# Patient Record
Sex: Male | Born: 1945 | Race: White | Hispanic: No | Marital: Married | Smoking: Former smoker
Health system: Southern US, Community
[De-identification: ages and names within clinical notes are randomized; demographics above are authoritative.]

## PROBLEM LIST (undated history)

## (undated) DIAGNOSIS — E119 Type 2 diabetes mellitus without complications: Secondary | ICD-10-CM

## (undated) DIAGNOSIS — E7800 Pure hypercholesterolemia, unspecified: Secondary | ICD-10-CM

## (undated) HISTORY — PX: BACK SURGERY: SHX140

## (undated) HISTORY — PX: NECK SURGERY: SHX720

---

## 1998-10-01 ENCOUNTER — Encounter (INDEPENDENT_AMBULATORY_CARE_PROVIDER_SITE_OTHER): Payer: Self-pay

## 1998-10-01 ENCOUNTER — Ambulatory Visit (HOSPITAL_COMMUNITY): Admission: RE | Admit: 1998-10-01 | Discharge: 1998-10-01 | Payer: Self-pay | Admitting: Gastroenterology

## 2003-12-13 ENCOUNTER — Ambulatory Visit: Payer: Self-pay | Admitting: Family Medicine

## 2004-02-15 ENCOUNTER — Ambulatory Visit: Payer: Self-pay | Admitting: Family Medicine

## 2004-05-13 ENCOUNTER — Ambulatory Visit: Payer: Self-pay | Admitting: Family Medicine

## 2004-06-27 ENCOUNTER — Ambulatory Visit (HOSPITAL_COMMUNITY): Admission: RE | Admit: 2004-06-27 | Discharge: 2004-06-28 | Payer: Self-pay | Admitting: Neurosurgery

## 2004-08-19 ENCOUNTER — Ambulatory Visit: Payer: Self-pay | Admitting: Family Medicine

## 2004-11-27 ENCOUNTER — Ambulatory Visit: Payer: Self-pay | Admitting: Family Medicine

## 2005-01-21 ENCOUNTER — Ambulatory Visit: Payer: Self-pay | Admitting: Family Medicine

## 2005-03-31 ENCOUNTER — Ambulatory Visit: Payer: Self-pay | Admitting: Family Medicine

## 2005-04-28 ENCOUNTER — Encounter: Admission: RE | Admit: 2005-04-28 | Discharge: 2005-04-28 | Payer: Self-pay | Admitting: Neurosurgery

## 2005-04-29 ENCOUNTER — Ambulatory Visit: Payer: Self-pay | Admitting: Family Medicine

## 2005-04-30 ENCOUNTER — Ambulatory Visit (HOSPITAL_COMMUNITY): Admission: RE | Admit: 2005-04-30 | Discharge: 2005-04-30 | Payer: Self-pay | Admitting: Neurosurgery

## 2005-08-07 ENCOUNTER — Ambulatory Visit: Payer: Self-pay | Admitting: Family Medicine

## 2005-10-02 ENCOUNTER — Ambulatory Visit: Payer: Self-pay | Admitting: Family Medicine

## 2005-11-18 ENCOUNTER — Ambulatory Visit: Payer: Self-pay | Admitting: Family Medicine

## 2006-02-23 ENCOUNTER — Ambulatory Visit: Payer: Self-pay | Admitting: Family Medicine

## 2006-04-08 ENCOUNTER — Ambulatory Visit: Payer: Self-pay | Admitting: Family Medicine

## 2006-06-02 ENCOUNTER — Ambulatory Visit: Payer: Self-pay | Admitting: Family Medicine

## 2010-06-21 NOTE — Op Note (Signed)
[name], [name]                  ACCOUNT NO.:  [id number]   MEDICAL RECORD NO.:  [id number]          PATIENT TYPE:  OIB   LOCATION:  2899                         FACILITY:  [facility]   PHYSICIAN:  Coletta Memos, M.D.     DATE OF BIRTH:  1945/11/29   DATE OF PROCEDURE:  06/27/2004  DATE OF DISCHARGE:                                 OPERATIVE REPORT   PREOPERATIVE DIAGNOSES:  1.  Displaced disk, left C5-6.  2.  Cervical spondylosis, C6-7.  3.  Cervical radiculopathy.   POSTOPERATIVE DIAGNOSES:  1.  Displaced disk, left C5-6.  2.  Cervical spondylosis, C6-7.  3.  Cervical radiculopathy.   PROCEDURE:  1.  Anterior cervical decompression, C5-6, C6-7.  2.  Arthrodesis, C5 to C7, via two 7-mm allograft Synthes filled with DBX      bone putty.  3.  Anterior plating, 37-mm ACCS Synthes plate, 2 screws in C5, 2 screws in      C6, 2 screws in C7, 14-mm self-tapping screws placed.   COMPLICATIONS:  None.   SURGEON:  Coletta Memos, M.D.   ASSISTANT:  Stefani Dama, M.D.   INDICATIONS:  Scott Price is a gentleman 65 years of age who presented with  severe pain in the left upper extremity.  I therefore recommended, after he  displayed some weakness in his left upper extremity in the triceps and the  region of the biceps, that he undergo operative decompression.  He agreed  and is admitted to the hospital today for a procedure.   OPERATIVE NOTE:  Scott Price was brought to the operating room, intubated and  placed under a general anesthetic without difficulty.  His head was placed  in slight extension on a cerebellar head rest with 5 pounds of traction  applied via a chin strap.  His neck was prepped and he was draped in a  sterile fashion.  I infiltrated 5 mL of 0.5% lidocaine and 1:200,000-  strength epinephrine into the cervical region starting at the midline and  extending to the medial border of the left sternocleidomastoid muscle at the  region of the cricoid.  I opened the skin with a  #10 blade and took this  down sharply to the platysma.  I dissected superior to the platysma,  rostrally and caudally.  I then opened the platysma in a horizontal fashion  using Metzenbaum scissors.  I then dissected inferior to the platysma  rostrally and caudally also.  I placed a spinal needle after creating an  avascular plane to the anterior cervical spine, pulling the medial strap  muscles medially and the carotid artery laterally with sternocleidomastoid.  The needle was at C5-6.  I then reflected the longus colli muscles from C5  down to C7 bilaterally.  I then placed 2  distractor pins, 1 in C5, the  other in C6.  I opened the C5-6 space with a #15 blade and proceeded with a  diskectomy.  The superior surface of the disk space was calcified and that  was opened rather easily.  I then used a high-speed drill,  Kerrison punches,  a Leksell rongeur and curettes to remove the disk and osteophytes.  I  drilled a significant amount of osteophytes away on both the right and left  sides, but was able to fully decompress both C6 nerve roots.  When that was  done, I sized the space and I moved, and I was able place a 7-mm allograft  filled with DBX bone putty into the disk space.  I then removed the  distraction pins and moved my retractor down to the C6-7 space.  I then  placed a distraction pin again at C6 and another one at C7.  I opened the  disk space with a #15 blade and again distracted it.  With the use of  Kerrison punches, a Leksell rongeur, high-speed air drill and curettes, I  removed the disk and what were significant osteophytes present at the C6-7  level.  The posterior longitudinal ligament was hypertrophied, partially  calcified and very hard.  With Dr. Verlee Rossetti assistance, I removed that  ligament and then was able to decompress the C7 nerve roots bilaterally.  Hemostasis was obtained.  I then placed a 7-mm allograft into the disk  space.  I then removed the distraction  pins.  A plate was then placed, 37  mm, 2 screws in C5, 6 and 7, each screw first placed by drilling a hole  through the bone and then by using the self-tapping screws.  X-ray showed  the plate at C5 and at 6 to be in good position; I could not see the plate  at C7.  Bone plug at C5-6 looked to be in good position, unable to see the  bone plug at C6-7.  The patient tolerated procedure well.  I then irrigated  the wound.  I then closed the wound in layered fashion using Vicryl sutures.  The platysma was reapproximated as was the subcuticular layer.  Dermabond  was used for a sterile dressing.  The patient was extubated in the operating  room, moving all extremities well.      KC/MEDQ  D:  06/27/2004  T:  06/28/2004  Job:  [id number]

## 2010-06-21 NOTE — Op Note (Signed)
NAMEJAZIAH, Scott Price                  ACCOUNT NO.:  [id number]   MEDICAL RECORD NO.:  [id number]          PATIENT TYPE:  AMB   LOCATION:  SDS                          FACILITY:  [facility]   PHYSICIAN:  Coletta Memos, M.D.     DATE OF BIRTH:  04-06-1945   DATE OF PROCEDURE:  04/30/2005  DATE OF DISCHARGE:                                 OPERATIVE REPORT   PREOPERATIVE DIAGNOSIS:  1.  Displaced disk left L4-5 or L3-4 depending on how the count is.  The      most important thing is that this was a second normal disk space from      the last disk space on the x-ray.  2.  Left L5 radiculopathy.   POSTOPERATIVE DIAGNOSIS:  1.  Displaced disk left L4-5 or L3-4 depending on how the count is.  The      most important thing is that this was a second normal disk space from      the last disk space on the x-ray.  2.  Left L5 radiculopathy.   PROCEDURE:  Left L4-5 semihemilaminectomy and diskectomy with  microdissection.   COMPLICATIONS:  None.   SURGEON:  Coletta Memos, M.D.   ASSISTANT:  Hewitt Shorts, M.D.   INDICATIONS:  Scott Price is a patient of mine, whom I operated on his neck.  He had done very well after that.  He has had severe pain in left lower  extremity now for a few weeks.  MRI was performed and showed a large  fragment of disk coming out of the second to last disk space.  Given that, I  recommended and he agreed to undergo operative decompression.  He had  weakness in the left lower extremity.   OPERATIVE NOTE:  Scott Price was taken to the operating room intubated, placed  under general anesthetic without difficulty.  Rolled prone onto a Wilson  frame.  All pressure points properly padded.  Back was prepped.  He draped  in sterile fashion.  I opened the skin after using a localizing x-ray and  infiltrated with 20 mL 0.5% lidocaine.   I opened the skin with a #10 blade and took this down to the thoracolumbar  fascia.  I then exposed the lamina of what was L4-L5 and the  sacrum.  I took  another x-ray.  This showed a Penfield 4 dissector at the inferior to the  lamina of the superior lamina I had exposed.   I then performed a semihemilaminectomy of L4 and removed ligamentum flavum  and decompressing the spinal canal.  I retracted thecal sac medially and  with microdissection using micro instruments, retracted thecal sac and then  identified what was a very large hump of disk material.  I was able to  opened that with a Penfield 4 dissector and then removed very large piece of  disk material.  I then removed small fragments afterwards.  Dr. Newell Coral  also assisted and removed some fragments which were placed medially.  After  that there was thorough decompression of  the left L5 root.  I then irrigated the wound.  I then closed the wound in  layered fashion using Vicryl sutures.  The thoracolumbar fascia was  reapproximated as was the subcuticular layer and subcutaneous layers.  Dermabond was used for sterile dressing.           ______________________________  Coletta Memos, M.D.     KC/MEDQ  D:  04/30/2005  T:  05/01/2005  Job:  [id number]

## 2019-11-19 ENCOUNTER — Encounter (HOSPITAL_COMMUNITY): Payer: Self-pay

## 2019-11-19 ENCOUNTER — Emergency Department (HOSPITAL_COMMUNITY): Payer: Medicare HMO

## 2019-11-19 ENCOUNTER — Emergency Department (HOSPITAL_COMMUNITY)
Admission: EM | Admit: 2019-11-19 | Discharge: 2019-11-19 | Disposition: A | Discharge status: Home / Self Care | Payer: Medicare HMO | Attending: Emergency Medicine | Admitting: Emergency Medicine

## 2019-11-19 ENCOUNTER — Other Ambulatory Visit: Payer: Self-pay

## 2019-11-19 DIAGNOSIS — I441 Atrioventricular block, second degree: Secondary | ICD-10-CM | POA: Diagnosis not present

## 2019-11-19 DIAGNOSIS — Z7982 Long term (current) use of aspirin: Secondary | ICD-10-CM | POA: Diagnosis not present

## 2019-11-19 DIAGNOSIS — E119 Type 2 diabetes mellitus without complications: Secondary | ICD-10-CM | POA: Diagnosis not present

## 2019-11-19 DIAGNOSIS — Z7984 Long term (current) use of oral hypoglycemic drugs: Secondary | ICD-10-CM | POA: Diagnosis not present

## 2019-11-19 DIAGNOSIS — Z20822 Contact with and (suspected) exposure to covid-19: Secondary | ICD-10-CM | POA: Insufficient documentation

## 2019-11-19 DIAGNOSIS — R002 Palpitations: Secondary | ICD-10-CM | POA: Diagnosis present

## 2019-11-19 HISTORY — DX: Type 2 diabetes mellitus without complications: E11.9

## 2019-11-19 HISTORY — DX: Pure hypercholesterolemia, unspecified: E78.00

## 2019-11-19 LAB — BASIC METABOLIC PANEL
Anion gap: 10 (ref 5–15)
BUN: 19 mg/dL (ref 8–23)
CO2: 22 mmol/L (ref 22–32)
Calcium: 9.9 mg/dL (ref 8.9–10.3)
Chloride: 104 mmol/L (ref 98–111)
Creatinine, Ser: 1 mg/dL (ref 0.61–1.24)
GFR, Estimated: >60 mL/min (ref >60)
Glucose, Bld: 156 mg/dL — ABNORMAL HIGH (ref 70–99)
Potassium: 4.3 mmol/L (ref 3.5–5.1)
Sodium: 136 mmol/L (ref 135–145)

## 2019-11-19 LAB — TROPONIN I (HIGH SENSITIVITY)
Troponin I (High Sensitivity): 3 ng/L (ref <18)
Troponin I (High Sensitivity): 3 ng/L (ref <18)

## 2019-11-19 LAB — RESPIRATORY PANEL BY RT PCR (FLU A&B, COVID)
Influenza A by PCR: NEGATIVE
Influenza B by PCR: NEGATIVE
SARS Coronavirus 2 by RT PCR: NEGATIVE

## 2019-11-19 LAB — CBC
HCT: 41.5 % (ref 39.0–52.0)
Hemoglobin: 14.1 g/dL (ref 13.0–17.0)
MCH: 30.3 pg (ref 26.0–34.0)
MCHC: 34 g/dL (ref 30.0–36.0)
MCV: 89.2 fL (ref 80.0–100.0)
Platelets: 236 10*3/uL (ref 150–400)
RBC: 4.65 MIL/uL (ref 4.22–5.81)
RDW: 13.4 % (ref 11.5–15.5)
WBC: 10.1 10*3/uL (ref 4.0–10.5)
nRBC: 0 % (ref 0.0–0.2)

## 2019-11-19 LAB — MAGNESIUM: Magnesium: 1.7 mg/dL (ref 1.7–2.4)

## 2019-11-19 LAB — D-DIMER, QUANTITATIVE (NOT AT ARMC): D-Dimer, Quant: 0.64 ug/mL-FEU — ABNORMAL HIGH (ref 0.00–0.50)

## 2019-11-19 NOTE — ED Provider Notes (Signed)
[facility] EMERGENCY DEPARTMENT Provider Note   CSN: [id number] Arrival date & time: 11/19/19  1142     History Chief Complaint  Patient presents with  . Chest Pain    Scott Price is a 74 y.o. male.  HPI   Patient states he has been having symptoms off and on for the last couple of weeks.  Patient states he has been having intermittent episodes of feeling palpitation his heart pounding.  He will start to experience some pain in his chest and it will go into his neck.  When these episodes occur the patient feels like he is going to blackout.  Patient states he gets dizzy and lightheaded when this happens.  He also feels like he has a hard time breathing.  Patient states he was seen in September at under urgent care and was diagnosed with bronchitis.  He went to an urgent care last week on the ninth and was treated for respiratory infection and vertigo.  Patient continues to have the symptoms.  He had another episode today and is concerned there is something going wrong with his heart or lungs.  Patient does not have history of heart disease.  He does not smoke.  No history of PE. Past Medical History:  Diagnosis Date  . Diabetes mellitus without complication (HCC)   . High cholesterol     There are no problems to display for this patient.   Past Surgical History:  Procedure Laterality Date  . BACK SURGERY    . NECK SURGERY         History reviewed. No pertinent family history.  Social History   Tobacco Use  . Smoking status: Never Smoker  . Smokeless tobacco: Never Used  Substance Use Topics  . Alcohol use: Not Currently  . Drug use: Yes    Types: Marijuana    Home Medications Prior to Admission medications   Medication Sig Start Date End Date Taking? Authorizing Provider  albuterol (VENTOLIN HFA) 108 (90 Base) MCG/ACT inhaler Inhale 1-2 puffs into the lungs every 6 (six) hours as needed for wheezing or shortness of breath.  11/12/19 11/11/20 Yes [provider]  aspirin 81 MG EC tablet Take 81 mg by mouth daily.    Yes [provider]  Cholecalciferol (VITAMIN D3) 1.25 MG (50000 UT) TABS Take 1 tablet by mouth daily.   Yes [provider]  cyanocobalamin 1000 MCG tablet Take 1,000 mcg by mouth daily.   Yes [provider]  ferrous sulfate 325 (65 FE) MG tablet Take 325 mg by mouth daily with breakfast.   Yes [provider]  glipiZIDE (GLUCOTROL) 5 MG tablet Take 10 mg by mouth daily.    Yes [provider]  lisinopril (ZESTRIL) 2.5 MG tablet Take 2.5 mg by mouth daily.    Yes [provider]  magnesium oxide (MAG-OX) 400 MG tablet Take 400 mg by mouth 2 (two) times daily.   Yes [provider]  meclizine (ANTIVERT) 25 MG tablet Take 25 mg by mouth 3 (three) times daily as needed for dizziness.  11/12/19  Yes [provider]  metFORMIN (GLUCOPHAGE) 1000 MG tablet Take 1,000 mg by mouth in the morning and at bedtime.    Yes [provider]  Multiple Vitamin (MULTI-VITAMIN) tablet Take 1 tablet by mouth daily.   Yes [provider]  omeprazole (PRILOSEC) 20 MG capsule Take 20 mg by mouth every other day.   Yes [provider]  rosuvastatin (  CRESTOR) 20 MG tablet Take 20 mg by mouth daily.   Yes [provider]    Allergies    Doxycycline, Other, Prednisone, and Sulfa antibiotics  Review of Systems   Review of Systems  All other systems reviewed and are negative.   Physical Exam Updated Vital Signs BP 110/63   Pulse 65   Temp 97.9 F (36.6 C) (Oral)   Resp 20   Ht 1.753 m (5\' 9" )   Wt 79.4 kg   SpO2 100%   BMI 25.84 kg/m   Physical Exam Vitals and nursing note reviewed.  Constitutional:      General: He is not in acute distress.    Appearance: He is well-developed.  HENT:     Head: Normocephalic and atraumatic.     Right Ear: External ear normal.     Left Ear: External ear normal.  Eyes:     General: No scleral  icterus.       Right eye: No discharge.        Left eye: No discharge.     Conjunctiva/sclera: Conjunctivae normal.  Neck:     Trachea: No tracheal deviation.  Cardiovascular:     Rate and Rhythm: Normal rate. Rhythm irregular.     Comments: Irregularity noted on the monitor, sinus arrythmia no pvc Pulmonary:     Effort: Pulmonary effort is normal. No respiratory distress.     Breath sounds: Normal breath sounds. No stridor. No wheezing or rales.  Abdominal:     General: Bowel sounds are normal. There is no distension.     Palpations: Abdomen is soft.     Tenderness: There is no abdominal tenderness. There is no guarding or rebound.  Musculoskeletal:        General: No tenderness.     Cervical back: Neck supple.  Skin:    General: Skin is warm and dry.     Findings: No rash.  Neurological:     Mental Status: He is alert.     Cranial Nerves: No cranial nerve deficit (no facial droop, extraocular movements intact, no slurred speech).     Sensory: No sensory deficit.     Motor: No abnormal muscle tone or seizure activity.     Coordination: Coordination normal.     ED Results / Procedures / Treatments   Labs (all labs ordered are listed, but only abnormal results are displayed) Labs Reviewed  BASIC METABOLIC PANEL - Abnormal; Notable for the following components:      Result Value   Glucose, Bld 156 (*)    All other components within normal limits  D-DIMER, QUANTITATIVE (NOT AT [facility]) - Abnormal; Notable for the following components:   D-Dimer, Quant 0.64 (*)    All other components within normal limits  RESPIRATORY PANEL BY RT PCR (FLU A&B, COVID)  CBC  MAGNESIUM  TROPONIN I (HIGH SENSITIVITY)  TROPONIN I (HIGH SENSITIVITY)    EKG EKG Interpretation  Date/Time:  Saturday November 19 2019 11:51:15 EDT Ventricular Rate:  71 PR Interval:    QRS Duration: 100 QT Interval:  420 QTC Calculation: 457 R Axis:   -28 Text Interpretation: Sinus rhythm Sinus pause Prolonged  PR interval Borderline left axis deviation Abnormal R-wave progression, early transition No significant change since last tracing Confirmed by 01-03-1987 [phone]) on 11/19/2019 11:56:14 AM        Radiology DG Chest 2 View  Result Date: 11/19/2019 CLINICAL DATA:  Chest pain and shortness of breath EXAM: CHEST - 2  VIEW COMPARISON:  October 18, 2019 FINDINGS: Lungs are clear. Heart size and pulmonary vascularity are normal. No adenopathy. There is postoperative change in the lower cervical region. There is evidence of old trauma involving the lateral right clavicle. There is degenerative change in the thoracic spine. No pneumothorax. IMPRESSION: Lungs clear.  Cardiac silhouette within normal limits. Electronically Signed   By: Bretta Bang III M.D.   On: 11/19/2019 12:12    Procedures Procedures (including critical care time)  Medications Ordered in ED Medications - No data to display  ED Course  I have reviewed the triage vital signs and the nursing notes.  Pertinent labs & imaging results that were available during my care of the patient were reviewed by me and considered in my medical decision making (see chart for details).  Clinical Course as of Nov 18 1529  Sat Nov 19, 2019  1223 Some irregularity noted on the monitor.  Suspect cardiac dysrhythmia may be the cause of his symptoms.  Will place on cardiac monitor.  Labs pending   [JK]  1254 D-dimer elevated at 0.64 but within normal range when age-adjusted   [JK]  1319 Initial troponin normal.   [JK]  1346 Noted to have episodes of 2nd degree heart block on the monitor.  Appears to be type 2   [JK]  1447 D/w Dr Rennis Golden   [JK]  1452 Telemetry strips reviewed.  Patient appears to have a second-degree heart block.  Was not certain whether this was type I versus type II.  Reviewed the telemetry strip with Dr. Rennis Golden.  Does appear to be more of a type I heart block.  Patient can be referred to cardiology office for further  evaluation.   [JK]    Clinical Course User Index [JK] Linwood Dibbles, MD   MDM Rules/Calculators/A&P                          Patient presented to the ED for evaluation of complications and chest discomfort.  Patient's main issue was recurrent episodes of feeling lightheaded and presyncopal.  Patient's ED work-up is overall reassuring with normal serial troponins.  Low risk D-dimer.  CBC metabolic panel normal.  No signs to suggest PE or acute coronary syndrome.  While the patient was in the ED he did have evidence of a type I second-degree heart block.  I did review the telemetry strip with Dr. Rennis Golden.  Discussed these findings with the patient.  We will have him follow-up with the cardiology office for further treatment and evaluation. Final Clinical Impression(s) / ED Diagnoses Final diagnoses:  Mobitz type 1 second degree atrioventricular block    Rx / DC Orders ED Discharge Orders    None       Linwood Dibbles, MD 11/19/19 1531

## 2019-11-19 NOTE — Progress Notes (Signed)
   Called by Dr. Lynelle Doctor and asked to review at telemetry strip on this patient.  He presented with palpitations and feeling some dizziness.  EKG initially demonstrates normal sinus rhythm.  He did have an abnormal rhythm strip however which demonstrated heart rate at 49 with what appears to me to be second-degree Mobitz type I (Wenckebach).  There is clear progressive prolongation of the PR interval with dropped beats.  The underlying QRSd is normal, suggesting that this is more likely supra-Hissian block. The patient is not noted to be on any AV nodal blocking agents and I would specifically avoid those in the future.  It is reasonable however given his age to consider further monitoring to rule out Mobitz type II (2:1) AV block or more advanced block as a possible cause of his symptoms.  I would recommend outpatient monitoring and close cardiology follow-up.  This was discussed with Dr. Lynelle Doctor and he said he would arrange it.  Please do not hesitate to call for questions.  Chrystie Nose, MD, [facility], FACP  [place]  [facility]  Medical Director of the Advanced Lipid Disorders &  Cardiovascular Risk Reduction Clinic Diplomate of the American Board of Clinical Lipidology Attending Cardiologist  Direct Dial: [phone]  Fax: [phone]  Website:  [email or web address]

## 2019-11-19 NOTE — ED Triage Notes (Signed)
Pt to er room number 8, pt c/o chest pain off and on, states that he also has sob, states that it also feels like he is going to pass out from time to time.  States that he has been having this for the past two weeks, and he hasn't been able to get into the [place], states that it was getting better, but last night and today it was worse.

## 2019-11-21 ENCOUNTER — Encounter: Payer: Self-pay | Admitting: Internal Medicine

## 2019-11-21 ENCOUNTER — Other Ambulatory Visit: Payer: Self-pay

## 2019-11-21 ENCOUNTER — Other Ambulatory Visit: Payer: Self-pay | Admitting: Internal Medicine

## 2019-11-21 ENCOUNTER — Ambulatory Visit (INDEPENDENT_AMBULATORY_CARE_PROVIDER_SITE_OTHER): Payer: Medicare HMO | Admitting: Internal Medicine

## 2019-11-21 VITALS — BP 113/57 | HR 76 | Ht 69.0 in | Wt 183.0 lb

## 2019-11-21 DIAGNOSIS — R0602 Shortness of breath: Secondary | ICD-10-CM

## 2019-11-21 DIAGNOSIS — R42 Dizziness and giddiness: Secondary | ICD-10-CM

## 2019-11-21 DIAGNOSIS — I441 Atrioventricular block, second degree: Secondary | ICD-10-CM

## 2019-11-21 DIAGNOSIS — I200 Unstable angina: Secondary | ICD-10-CM

## 2019-11-21 NOTE — Patient Instructions (Addendum)
Medication Instructions:  Your physician recommends that you continue on your current medications as directed. Please refer to the Current Medication list given to you today.  *If you need a refill on your cardiac medications before your next appointment, please call your pharmacy*  If you have labs (blood work) drawn today and your tests are completely normal, you will receive your results only by: Marland Kitchen MyChart Message (if you have MyChart) OR . A paper copy in the mail If you have any lab test that is abnormal or we need to change your treatment, we will call you to review the results.   Testing/Procedures: Cardiac Catheterization @ [facility] tomorrow 11/22/2019   Follow-Up: At [facility], you and your health needs are our priority.  As part of our continuing mission to provide you with exceptional heart care, we have created designated Provider Care Teams.  These Care Teams include your primary Cardiologist (physician) and Advanced Practice Providers (APPs -  Physician Assistants and Nurse Practitioners) who all work together to provide you with the care you need, when you need it.  We recommend signing up for the patient portal called "MyChart".  Sign up information is provided on this After Visit Summary.  MyChart is used to connect with patients for Virtual Visits (Telemedicine).  Patients are able to view lab/test results, encounter notes, upcoming appointments, etc.  Non-urgent messages can be sent to your provider as well.   To learn more about what you can do with MyChart, go to [email or web address].    Your next appointment:   3-4 week(s) - after heart cath procedure  The format for your next appointment:   In Person  Provider:   You may see Dr. Rennis Golden or one of the following Advanced Practice Providers on your designated Care Team:    Azalee Course, PA-C  Micah Flesher, [place] or   Judy Pimple, [place]    Other Instructions    [facility] GROUP [facility]  CARDIOVASCULAR DIVISION [facility] [address] [place] [address] Dept: [phone] Loc: [phone]  JAKORI BURKETT  11/21/2019  You are scheduled for a Cardiac Catheterization on Tuesday, October 19 with Dr. Lance Muss.  1. Please arrive at the [facility] (Main Entrance A) at [facility]: [address] [place], [place] [address] at 9:00 AM. Free valet parking service is available.   Special note: Every effort is made to have your procedure done on time. Please understand that emergencies sometimes delay scheduled procedures.  2. Diet: Do not eat solid foods after midnight. You may have clear liquids until 5am upon the day of the procedure.  3. Labs:  BMET/CBC were done in ER on 10/16 YOU MAY NEED A COVID TEST ON ARRIVAL TO THE HOSPITAL  4. Medication instructions in preparation for your procedure: DO NOT TAKE Metformin or Glipizide the day of procedure.   You will also HOLD Metformin 48 hours post-procedure  On the morning of your procedure, take your Aspirin and any morning medicines NOT listed above.  You may use sips of water.  5. Plan for one night stay--bring personal belongings. 6. Bring a current list of your medications and current insurance cards. 7. You MUST have a responsible person to drive you home. 8. Someone MUST be with you the first 24 hours after you arrive home or your discharge will be delayed. 9. Please wear clothes that are easy to get on and off and wear slip-on shoes.  Thank you  for allowing [place] to care for you!   -- [facility]

## 2019-11-22 ENCOUNTER — Other Ambulatory Visit: Payer: Self-pay

## 2019-11-22 ENCOUNTER — Ambulatory Visit: Payer: Medicare HMO | Admitting: Cardiovascular Disease

## 2019-11-22 ENCOUNTER — Encounter: Payer: Self-pay | Admitting: Internal Medicine

## 2019-11-22 ENCOUNTER — Encounter (HOSPITAL_COMMUNITY)
Admission: RE | Disposition: A | Discharge status: Home / Self Care | Payer: Self-pay | Source: Home / Self Care | Attending: Interventional Cardiology

## 2019-11-22 ENCOUNTER — Ambulatory Visit (HOSPITAL_COMMUNITY)
Admission: RE | Admit: 2019-11-22 | Discharge: 2019-11-22 | Disposition: A | Discharge status: Home / Self Care | Payer: Medicare HMO | Attending: Interventional Cardiology | Admitting: Interventional Cardiology

## 2019-11-22 DIAGNOSIS — Z881 Allergy status to other antibiotic agents status: Secondary | ICD-10-CM | POA: Diagnosis not present

## 2019-11-22 DIAGNOSIS — Z8249 Family history of ischemic heart disease and other diseases of the circulatory system: Secondary | ICD-10-CM | POA: Diagnosis not present

## 2019-11-22 DIAGNOSIS — I10 Essential (primary) hypertension: Secondary | ICD-10-CM | POA: Insufficient documentation

## 2019-11-22 DIAGNOSIS — I25110 Atherosclerotic heart disease of native coronary artery with unstable angina pectoris: Secondary | ICD-10-CM | POA: Diagnosis present

## 2019-11-22 DIAGNOSIS — Z882 Allergy status to sulfonamides status: Secondary | ICD-10-CM | POA: Insufficient documentation

## 2019-11-22 DIAGNOSIS — E7800 Pure hypercholesterolemia, unspecified: Secondary | ICD-10-CM | POA: Diagnosis not present

## 2019-11-22 DIAGNOSIS — Z7982 Long term (current) use of aspirin: Secondary | ICD-10-CM | POA: Insufficient documentation

## 2019-11-22 DIAGNOSIS — Z888 Allergy status to other drugs, medicaments and biological substances status: Secondary | ICD-10-CM | POA: Diagnosis not present

## 2019-11-22 DIAGNOSIS — Z7984 Long term (current) use of oral hypoglycemic drugs: Secondary | ICD-10-CM | POA: Insufficient documentation

## 2019-11-22 DIAGNOSIS — E119 Type 2 diabetes mellitus without complications: Secondary | ICD-10-CM | POA: Insufficient documentation

## 2019-11-22 DIAGNOSIS — I200 Unstable angina: Secondary | ICD-10-CM | POA: Diagnosis not present

## 2019-11-22 DIAGNOSIS — I441 Atrioventricular block, second degree: Secondary | ICD-10-CM | POA: Diagnosis not present

## 2019-11-22 DIAGNOSIS — Z79899 Other long term (current) drug therapy: Secondary | ICD-10-CM | POA: Diagnosis not present

## 2019-11-22 HISTORY — PX: LEFT HEART CATH AND CORONARY ANGIOGRAPHY: CATH118249

## 2019-11-22 LAB — GLUCOSE, CAPILLARY: Glucose-Capillary: 227 mg/dL — ABNORMAL HIGH (ref 70–99)

## 2019-11-22 SURGERY — LEFT HEART CATH AND CORONARY ANGIOGRAPHY
Anesthesia: LOCAL

## 2019-11-22 MED ORDER — FENTANYL CITRATE (PF) 100 MCG/2ML IJ SOLN
INTRAMUSCULAR | Status: AC
  Filled 2019-11-22: qty 2

## 2019-11-22 MED ORDER — SODIUM CHLORIDE 0.9% FLUSH: 3.0000 mL | Freq: Two times a day (BID) | INTRAVENOUS | Status: DC

## 2019-11-22 MED ORDER — VERAPAMIL HCL 2.5 MG/ML IV SOLN
INTRAVENOUS | Status: AC
  Filled 2019-11-22: qty 2

## 2019-11-22 MED ORDER — VERAPAMIL HCL 2.5 MG/ML IV SOLN
INTRAVENOUS | Status: DC | PRN
  Administered 2019-11-22: 10 mL via INTRA_ARTERIAL

## 2019-11-22 MED ORDER — MIDAZOLAM HCL 2 MG/2ML IJ SOLN
INTRAMUSCULAR | Status: DC | PRN
  Administered 2019-11-22: 2 mg via INTRAVENOUS

## 2019-11-22 MED ORDER — IOHEXOL 350 MG/ML SOLN
INTRAVENOUS | Status: DC | PRN
  Administered 2019-11-22: 50 mL

## 2019-11-22 MED ORDER — SODIUM CHLORIDE 0.9 % WEIGHT BASED INFUSION
3.0000 mL/kg/h | INTRAVENOUS | Status: AC
  Administered 2019-11-22: 3 mL/kg/h via INTRAVENOUS

## 2019-11-22 MED ORDER — HEPARIN (PORCINE) IN NACL 1000-0.9 UT/500ML-% IV SOLN
INTRAVENOUS | Status: AC
  Filled 2019-11-22: qty 1000

## 2019-11-22 MED ORDER — HYDRALAZINE HCL 20 MG/ML IJ SOLN: 10.0000 mg | INTRAMUSCULAR | Status: DC | PRN

## 2019-11-22 MED ORDER — HEPARIN (PORCINE) IN NACL 1000-0.9 UT/500ML-% IV SOLN
INTRAVENOUS | Status: DC | PRN
  Administered 2019-11-22: 500 mL

## 2019-11-22 MED ORDER — HEPARIN SODIUM (PORCINE) 1000 UNIT/ML IJ SOLN
INTRAMUSCULAR | Status: DC | PRN
  Administered 2019-11-22: 4000 [IU] via INTRAVENOUS

## 2019-11-22 MED ORDER — ACETAMINOPHEN 325 MG PO TABS: 650.0000 mg | ORAL_TABLET | ORAL | Status: DC | PRN

## 2019-11-22 MED ORDER — LIDOCAINE HCL (PF) 1 % IJ SOLN
INTRAMUSCULAR | Status: DC | PRN
  Administered 2019-11-22: 2 mL

## 2019-11-22 MED ORDER — FENTANYL CITRATE (PF) 100 MCG/2ML IJ SOLN
INTRAMUSCULAR | Status: DC | PRN
Start: 2019-11-22 — End: 2019-11-22
  Administered 2019-11-22: 25 ug via INTRAVENOUS

## 2019-11-22 MED ORDER — SODIUM CHLORIDE 0.9 % IV SOLN: 250.0000 mL | INTRAVENOUS | Status: DC | PRN

## 2019-11-22 MED ORDER — SODIUM CHLORIDE 0.9% FLUSH: 3.0000 mL | INTRAVENOUS | Status: DC | PRN

## 2019-11-22 MED ORDER — ONDANSETRON HCL 4 MG/2ML IJ SOLN: 4.0000 mg | Freq: Four times a day (QID) | INTRAMUSCULAR | Status: DC | PRN

## 2019-11-22 MED ORDER — ASPIRIN 81 MG PO CHEW: 81.0000 mg | CHEWABLE_TABLET | ORAL | Status: DC

## 2019-11-22 MED ORDER — LABETALOL HCL 5 MG/ML IV SOLN: 10.0000 mg | INTRAVENOUS | Status: DC | PRN

## 2019-11-22 MED ORDER — LIDOCAINE HCL (PF) 1 % IJ SOLN
INTRAMUSCULAR | Status: AC
  Filled 2019-11-22: qty 30

## 2019-11-22 MED ORDER — SODIUM CHLORIDE 0.9 % IV SOLN: INTRAVENOUS | Status: AC

## 2019-11-22 MED ORDER — MIDAZOLAM HCL 2 MG/2ML IJ SOLN
INTRAMUSCULAR | Status: AC
  Filled 2019-11-22: qty 2

## 2019-11-22 MED ORDER — HEPARIN SODIUM (PORCINE) 1000 UNIT/ML IJ SOLN
INTRAMUSCULAR | Status: AC
  Filled 2019-11-22: qty 1

## 2019-11-22 MED ORDER — METFORMIN HCL 1000 MG PO TABS: 1000.0000 mg | ORAL_TABLET | Freq: Two times a day (BID) | ORAL | Status: AC

## 2019-11-22 MED ORDER — SODIUM CHLORIDE 0.9 % WEIGHT BASED INFUSION: 1.0000 mL/kg/h | INTRAVENOUS | Status: DC

## 2019-11-22 SURGICAL SUPPLY — 9 items
CATH 5FR JL3.5 JR4 ANG PIG MP (CATHETERS) ×2 IMPLANT
DEVICE RAD COMP TR BAND LRG (VASCULAR PRODUCTS) ×1 IMPLANT
GLIDESHEATH SLEND SS 6F .021 (SHEATH) ×1 IMPLANT
GUIDEWIRE INQWIRE 1.5J.035X260 (WIRE) IMPLANT
INQWIRE 1.5J .035X260CM (WIRE) ×2
KIT HEART LEFT (KITS) ×2 IMPLANT
PACK CARDIAC CATHETERIZATION (CUSTOM PROCEDURE TRAY) ×2 IMPLANT
TRANSDUCER W/STOPCOCK (MISCELLANEOUS) ×2 IMPLANT
TUBING CIL FLEX 10 FLL-RA (TUBING) ×2 IMPLANT

## 2019-11-22 NOTE — Progress Notes (Signed)
Scott Price had Covid test in ER on Friday, he was set up with office appt yesterday and cath set up for today.  He says he hasn't been out of the house over the weekend except to go to Dr appt yesterday.  He wore his mask to office appt.  He states no one has been at his home since his covid test.  Spoke to Dr Eldridge Dace, no need to repeat covid test.

## 2019-11-22 NOTE — Interval H&P Note (Signed)
Cath Lab Visit (complete for each Cath Lab visit)  Clinical Evaluation Leading to the Procedure:   ACS: Yes.    Non-ACS:    Anginal Classification: CCS IV  Anti-ischemic medical therapy: Minimal Therapy (1 class of medications)  Non-Invasive Test Results: No non-invasive testing performed  Prior CABG: No previous CABG      History and Physical Interval Note:  11/22/2019 10:43 AM  Scott Price  has presented today for surgery, with the diagnosis of nstemi.  The various methods of treatment have been discussed with the patient and family. After consideration of risks, benefits and other options for treatment, the patient has consented to  Procedure(s): LEFT HEART CATH AND CORONARY ANGIOGRAPHY (N/A) as a surgical intervention.  The patient's history has been reviewed, patient examined, no change in status, stable for surgery.  I have reviewed the patient's chart and labs.  Questions were answered to the patient's satisfaction.     Lance Muss

## 2019-11-22 NOTE — Progress Notes (Signed)
 OFFICE NOTE  Chief Complaint:  Chest pain, fatigue, dizziness  Primary Care Physician: Vasireddy, Sabitha, MD  HPI:  Scott Price is a 74 y.o. male with a past medial history significant for type 2 diabetes and dyslipidemia with a family history of heart disease in multiple brothers.  Mr. Taves was seen over the weekend in the [place], ER with progressive chest discomfort shortness of breath and dizziness.  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  He says during these episodes he gets chest tightness, fatigue and dizziness and has to sit down with some relief but his symptoms have been worsening.  He was seen in the ER again and ruled out for MI.  During his monitoring, he was noted to have predominantly sinus rhythm although he did have one short strip of heart block.  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  Today in follow-up his EKG demonstrates normal sinus rhythm at 76 without ischemic changes.  PMHx:  Past Medical History:  Diagnosis Date  . Diabetes mellitus without complication (HCC)   . High cholesterol     Past Surgical History:  Procedure Laterality Date  . BACK SURGERY    . NECK SURGERY      FAMHx:  Family History  Problem Relation Age of Onset  . CAD Brother   . CAD Brother     SOCHx:   reports that he has never smoked. He has never used smokeless tobacco. He reports previous alcohol use. He reports current drug use. Drug: Marijuana.  ALLERGIES:  Allergies  Allergen Reactions  . Doxycycline Other (See Comments)  . Other     Amoxyacillin, gives him a rash   . Prednisone     High blood sugar  . Sulfa Antibiotics Rash    ROS: Pertinent items noted in HPI and remainder of comprehensive ROS otherwise negative.  HOME MEDS: Current Outpatient  Medications on File Prior to Visit  Medication Sig Dispense Refill  . albuterol (VENTOLIN HFA) 108 (90 Base) MCG/ACT inhaler Inhale 1-2 puffs into the lungs every 6 (six) hours as needed for wheezing or shortness of breath.     . aspirin 81 MG EC tablet Take 81 mg by mouth daily.     . Cholecalciferol (VITAMIN D3) 1.25 MG (50000 UT) TABS Take 1 tablet by mouth daily.    . cyanocobalamin 1000 MCG tablet Take 1,000 mcg by mouth daily.    . ferrous sulfate 325 (65 FE) MG tablet Take 325 mg by mouth daily with breakfast.    . glipiZIDE (GLUCOTROL) 5 MG tablet Take 10 mg by mouth daily.     . lisinopril (ZESTRIL) 2.5 MG tablet Take 2.5 mg by mouth daily.     . magnesium oxide (MAG-OX) 400 MG tablet Take 400 mg by mouth 2 (two) times daily.    . meclizine (ANTIVERT) 25 MG tablet Take 25 mg by mouth 3 (three) times daily as needed for dizziness.     . metFORMIN (GLUCOPHAGE) 1000 MG tablet Take 1,000 mg by mouth in the morning and at bedtime.     . Multiple Vitamin (MULTI-VITAMIN) tablet Take 1 tablet by mouth daily.    . omeprazole (PRILOSEC) 20 MG capsule Take 20 mg by mouth every other day.    . rosuvastatin (  CRESTOR) 20 MG tablet Take 20 mg by mouth daily.     No current facility-administered medications on file prior to visit.    LABS/IMAGING: No results found for this or any previous visit (from the past 48 hour(s)). No results found.  LIPID PANEL: No results found for: CHOL, TRIG, HDL, CHOLHDL, VLDL, LDLCALC, LDLDIRECT   WEIGHTS: Wt Readings from Last 3 Encounters:  11/21/19 183 lb (83 kg)  11/19/19 175 lb (79.4 kg)    VITALS: BP (!) 113/57   Pulse 76   Ht 5\' 9"  (1.753 m)   Wt 183 lb (83 kg)   SpO2 100%   BMI 27.02 kg/m   EXAM: General appearance: alert and no distress Neck: no carotid bruit, no JVD, supple, symmetrical, trachea midline, thyroid: normal to inspection and palpation and thyroid not enlarged, symmetric, no tenderness/mass/nodules Lungs: clear to auscultation  bilaterally Heart: regular rate and rhythm, S1, S2 normal, no murmur, click, rub or gallop Abdomen: soft, non-tender; bowel sounds normal; no masses,  no organomegaly Extremities: extremities normal, atraumatic, no cyanosis or edema Pulses: 2+ and symmetric Skin: Skin color, texture, turgor normal. No rashes or lesions Neurologic: Grossly normal Psych: Pleasant  EKG: Normal sinus rhythm at 76- personally reviewed  ASSESSMENT: 1. Unstable angina 2. Intermittent second-degree type I AV block 3. Hypertension 4. Type 2 diabetes 5. Family history of premature coronary disease in multiple brothers  PLAN: 1.   Mr. Milich had presented to the emergency department but ruled out for MI.  He had an intermittent Mobitz 1 AV block but really has been complaining of progressive chest discomfort, shortness of breath and dizziness as well as significant exercise intolerance and exertional symptoms that is not allowing him to perform his job.  I am concerned that this represents unstable angina.  His EKG does appear normal.  He has a strong family history of heart disease in multiple brothers around the same age and has risk factors including hypertension and type 2 diabetes.  I would recommend left heart catheterization given a higher pretest probability for coronary disease.  I discussed the risk, benefits and alternatives with him and he is agreeable to proceed.  We have been able to schedule him for a procedure tomorrow.  Unfortunately he will need repeat Covid testing despite a negative test this past Saturday and isolating at home.  Depending on the results, he may need further monitoring for higher grade AV block, however if he has coronary disease this likely explains it.  Plan follow-up with me afterwards.  Friday, MD, [facility], FACP  [place]  [facility]  Medical Director of the Advanced Lipid Disorders &  Cardiovascular Risk Reduction Clinic Diplomate of the American Board of Clinical  Lipidology Attending Cardiologist  Direct Dial: [phone]  Fax: [phone]  Website:  [phone] Donnica Jarnagin 11/22/2019, 8:41 AM

## 2019-11-22 NOTE — H&P (View-Only) (Signed)
OFFICE NOTE  Chief Complaint:  Chest pain, fatigue, dizziness  Primary Care Physician: Craig Staggers, MD  HPI:  Scott Price is a 74 y.o. male with a past medial history significant for type 2 diabetes and dyslipidemia with a family history of heart disease in multiple brothers.  Mr. Tolan was seen over the weekend in the [facility], ER with progressive chest discomfort shortness of breath and dizziness.  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  He says during these episodes he gets chest tightness, fatigue and dizziness and has to sit down with some relief but his symptoms have been worsening.  He was seen in the ER again and ruled out for MI.  During his monitoring, he was noted to have predominantly sinus rhythm although he did have one short strip of heart block.  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  Today in follow-up his EKG demonstrates normal sinus rhythm at 76 without ischemic changes.  PMHx:  Past Medical History:  Diagnosis Date  . Diabetes mellitus without complication (HCC)   . High cholesterol     Past Surgical History:  Procedure Laterality Date  . BACK SURGERY    . NECK SURGERY      FAMHx:  Family History  Problem Relation Age of Onset  . CAD Brother   . CAD Brother     SOCHx:   reports that he has never smoked. He has never used smokeless tobacco. He reports previous alcohol use. He reports current drug use. Drug: Marijuana.  ALLERGIES:  Allergies  Allergen Reactions  . Doxycycline Other (See Comments)  . Other     Amoxyacillin, gives him a rash   . Prednisone     High blood sugar  . Sulfa Antibiotics Rash    ROS: Pertinent items noted in HPI and remainder of comprehensive ROS otherwise negative.  HOME MEDS: Current Outpatient  Medications on File Prior to Visit  Medication Sig Dispense Refill  . albuterol (VENTOLIN HFA) 108 (90 Base) MCG/ACT inhaler Inhale 1-2 puffs into the lungs every 6 (six) hours as needed for wheezing or shortness of breath.     Marland Kitchen aspirin 81 MG EC tablet Take 81 mg by mouth daily.     . Cholecalciferol (VITAMIN D3) 1.25 MG (50000 UT) TABS Take 1 tablet by mouth daily.    . cyanocobalamin 1000 MCG tablet Take 1,000 mcg by mouth daily.    . ferrous sulfate 325 (65 FE) MG tablet Take 325 mg by mouth daily with breakfast.    . glipiZIDE (GLUCOTROL) 5 MG tablet Take 10 mg by mouth daily.     Marland Kitchen lisinopril (ZESTRIL) 2.5 MG tablet Take 2.5 mg by mouth daily.     . magnesium oxide (MAG-OX) 400 MG tablet Take 400 mg by mouth 2 (two) times daily.    . meclizine (ANTIVERT) 25 MG tablet Take 25 mg by mouth 3 (three) times daily as needed for dizziness.     . metFORMIN (GLUCOPHAGE) 1000 MG tablet Take 1,000 mg by mouth in the morning and at bedtime.     . Multiple Vitamin (MULTI-VITAMIN) tablet Take 1 tablet by mouth daily.    Marland Kitchen omeprazole (PRILOSEC) 20 MG capsule Take 20 mg by mouth every other day.    . rosuvastatin (  CRESTOR) 20 MG tablet Take 20 mg by mouth daily.     No current facility-administered medications on file prior to visit.    LABS/IMAGING: No results found for this or any previous visit (from the past 48 hour(s)). No results found.  LIPID PANEL: No results found for: CHOL, TRIG, HDL, CHOLHDL, VLDL, LDLCALC, LDLDIRECT   WEIGHTS: Wt Readings from Last 3 Encounters:  11/21/19 183 lb (83 kg)  11/19/19 175 lb (79.4 kg)    VITALS: BP (!) 113/57   Pulse 76   Ht 5\' 9"  (1.753 m)   Wt 183 lb (83 kg)   SpO2 100%   BMI 27.02 kg/m   EXAM: General appearance: alert and no distress Neck: no carotid bruit, no JVD, supple, symmetrical, trachea midline, thyroid: normal to inspection and palpation and thyroid not enlarged, symmetric, no tenderness/mass/nodules Lungs: clear to auscultation  bilaterally Heart: regular rate and rhythm, S1, S2 normal, no murmur, click, rub or gallop Abdomen: soft, non-tender; bowel sounds normal; no masses,  no organomegaly Extremities: extremities normal, atraumatic, no cyanosis or edema Pulses: 2+ and symmetric Skin: Skin color, texture, turgor normal. No rashes or lesions Neurologic: Grossly normal Psych: Pleasant  EKG: Normal sinus rhythm at 76- personally reviewed  ASSESSMENT: 1. Unstable angina 2. Intermittent second-degree type I AV block 3. Hypertension 4. Type 2 diabetes 5. Family history of premature coronary disease in multiple brothers  PLAN: 1.   Mr. Milich had presented to the emergency department but ruled out for MI.  He had an intermittent Mobitz 1 AV block but really has been complaining of progressive chest discomfort, shortness of breath and dizziness as well as significant exercise intolerance and exertional symptoms that is not allowing him to perform his job.  I am concerned that this represents unstable angina.  His EKG does appear normal.  He has a strong family history of heart disease in multiple brothers around the same age and has risk factors including hypertension and type 2 diabetes.  I would recommend left heart catheterization given a higher pretest probability for coronary disease.  I discussed the risk, benefits and alternatives with him and he is agreeable to proceed.  We have been able to schedule him for a procedure tomorrow.  Unfortunately he will need repeat Covid testing despite a negative test this past Saturday and isolating at home.  Depending on the results, he may need further monitoring for higher grade AV block, however if he has coronary disease this likely explains it.  Plan follow-up with me afterwards.  Friday, MD, [facility], FACP  [place]  [facility]  Medical Director of the Advanced Lipid Disorders &  Cardiovascular Risk Reduction Clinic Diplomate of the American Board of Clinical  Lipidology Attending Cardiologist  Direct Dial: [phone]  Fax: [phone]  Website:  [phone] Fina Heizer 11/22/2019, 8:41 AM

## 2019-11-22 NOTE — Discharge Instructions (Signed)
Drink plenty of fluid for 48 hours and keep wrist elevated at heart level for 24 hours  Radial Site Care   This sheet gives you information about how to care for yourself after your procedure. Your health care provider may also give you more specific instructions. If you have problems or questions, contact your health care provider. What can I expect after the procedure? After the procedure, it is common to have:  Bruising and tenderness at the catheter insertion area. Follow these instructions at home: Medicines  Take over-the-counter and prescription medicines only as told by your health care provider. Insertion site care 1. Follow instructions from your health care provider about how to take care of your insertion site. Make sure you: ? Wash your hands with soap and water before you change your bandage (dressing). If soap and water are not available, use hand sanitizer. ? remove your dressing as told by your health care provider. In 24 hours 2. Check your insertion site every day for signs of infection. Check for: ? Redness, swelling, or pain. ? Fluid or blood. ? Pus or a bad smell. ? Warmth. 3. Do not take baths, swim, or use a hot tub until your health care provider approves. 4. You may shower 24-48 hours after the procedure, or as directed by your health care provider. ? Remove the dressing and gently wash the site with plain soap and water. ? Pat the area dry with a clean towel. ? Do not rub the site. That could cause bleeding. 5. Do not apply powder or lotion to the site. Activity   1. For 24 hours after the procedure, or as directed by your health care provider: ? Do not flex or bend the affected arm. ? Do not push or pull heavy objects with the affected arm. ? Do not drive yourself home from the hospital or clinic. You may drive 24 hours after the procedure unless your health care provider tells you not to. ? Do not operate machinery or power tools. 2. Do not lift  anything that is heavier than 10 lb (4.5 kg), or the limit that you are told, until your health care provider says that it is safe. For 4 days 3. Ask your health care provider when it is okay to: ? Return to work or school. ? Resume usual physical activities or sports. ? Resume sexual activity. General instructions  If the catheter site starts to bleed, raise your arm and put firm pressure on the site. If the bleeding does not stop, get help right away. This is a medical emergency.  If you went home on the same day as your procedure, a responsible adult should be with you for the first 24 hours after you arrive home.  Keep all follow-up visits as told by your health care provider. This is important. Contact a health care provider if:  You have a fever.  You have redness, swelling, or yellow drainage around your insertion site. Get help right away if:  You have unusual pain at the radial site.  The catheter insertion area swells very fast.  The insertion area is bleeding, and the bleeding does not stop when you hold steady pressure on the area.  Your arm or hand becomes pale, cool, tingly, or numb. These symptoms may represent a serious problem that is an emergency. Do not wait to see if the symptoms will go away. Get medical help right away. Call your local emergency services (911 in the [place] Do not   drive yourself to the hospital. Summary  After the procedure, it is common to have bruising and tenderness at the site.  Follow instructions from your health care provider about how to take care of your radial site wound. Check the wound every day for signs of infection.  Do not lift anything that is heavier than 10 lb (4.5 kg), or the limit that you are told, until your health care provider says that it is safe. This information is not intended to replace advice given to you by your health care provider. Make sure you discuss any questions you have with your health care  provider. Document Revised: 02/25/2017 Document Reviewed: 02/25/2017 Elsevier Patient Education  2020 [employer]vier Inc.  

## 2019-11-22 NOTE — Progress Notes (Signed)
Patient was given discharge instructions. He verbalized understanding. 

## 2019-11-23 ENCOUNTER — Telehealth: Payer: Self-pay | Admitting: Internal Medicine

## 2019-11-23 DIAGNOSIS — I441 Atrioventricular block, second degree: Secondary | ICD-10-CM

## 2019-11-23 NOTE — Telephone Encounter (Signed)
Per verbal order from MD, he would like patient to wear 2 week zio monitor for mobitz type 1 second degree AVB  Left message to call back to inform patient of this  Monitor ordered

## 2019-11-23 NOTE — Telephone Encounter (Signed)
Spoke with wife about monitor that was ordered. Instructions have been provided to patient via MyChart also. She agreed w/plan

## 2019-11-26 ENCOUNTER — Ambulatory Visit (INDEPENDENT_AMBULATORY_CARE_PROVIDER_SITE_OTHER): Payer: Medicare HMO

## 2019-11-26 DIAGNOSIS — I441 Atrioventricular block, second degree: Secondary | ICD-10-CM | POA: Diagnosis not present

## 2019-11-29 ENCOUNTER — Ambulatory Visit: Payer: Medicare HMO | Admitting: Cardiology

## 2019-12-13 ENCOUNTER — Ambulatory Visit: Payer: Medicare HMO | Admitting: Internal Medicine

## 2019-12-13 ENCOUNTER — Encounter: Payer: Self-pay | Admitting: Internal Medicine

## 2019-12-13 VITALS — BP 144/68 | HR 51 | Ht 69.0 in | Wt 191.0 lb

## 2019-12-13 DIAGNOSIS — I441 Atrioventricular block, second degree: Secondary | ICD-10-CM | POA: Diagnosis not present

## 2019-12-13 DIAGNOSIS — R079 Chest pain, unspecified: Secondary | ICD-10-CM | POA: Diagnosis not present

## 2019-12-13 DIAGNOSIS — R0602 Shortness of breath: Secondary | ICD-10-CM | POA: Diagnosis not present

## 2019-12-13 DIAGNOSIS — R42 Dizziness and giddiness: Secondary | ICD-10-CM

## 2019-12-13 DIAGNOSIS — Z87891 Personal history of nicotine dependence: Secondary | ICD-10-CM

## 2019-12-13 NOTE — Patient Instructions (Addendum)
Medication Instructions:  Your physician recommends that you continue on your current medications as directed. Please refer to the Current Medication list given to you today.  *If you need a refill on your cardiac medications before your next appointment, please call your pharmacy*   Lab Work: BMET/BNP prior to CT test  If you have labs (blood work) drawn today and your tests are completely normal, you will receive your results only by: Marland Kitchen MyChart Message (if you have MyChart) OR . A paper copy in the mail If you have any lab test that is abnormal or we need to change your treatment, we will call you to review the results.   Testing/Procedures: CT test at [facility]  PFTs (lung function test) at [facility]   Follow-Up: At [facility], you and your health needs are our priority.  As part of our continuing mission to provide you with exceptional heart care, we have created designated Provider Care Teams.  These Care Teams include your primary Cardiologist (physician) and Advanced Practice Providers (APPs -  Physician Assistants and Nurse Practitioners) who all work together to provide you with the care you need, when you need it.  We recommend signing up for the patient portal called "MyChart".  Sign up information is provided on this After Visit Summary.  MyChart is used to connect with patients for Virtual Visits (Telemedicine).  Patients are able to view lab/test results, encounter notes, upcoming appointments, etc.  Non-urgent messages can be sent to your provider as well.   To learn more about what you can do with MyChart, go to [email or web address].    Your next appointment:   1-2 month(s)  The format for your next appointment:   In Person  Provider:   You may see Dr. Rennis Golden or one of the following Advanced Practice Providers on your designated Care Team:    Azalee Course, PA-C  Micah Flesher, [place] or   Judy Pimple, [place]    Other Instructions

## 2019-12-13 NOTE — Progress Notes (Signed)
OFFICE NOTE  Chief Complaint:  Chest pain, fatigue, dizziness  Primary Care Physician: Craig Staggers, MD  HPI:  Scott Price is a 74 y.o. male with a past medial history significant for type 2 diabetes and dyslipidemia with a family history of heart disease in multiple brothers.  Scott Price was seen over the weekend in the [facility] ER with progressive chest discomfort shortness of breath and dizziness.  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  He says during these episodes he gets chest tightness, fatigue and dizziness and has to sit down with some relief but his symptoms have been worsening.  He was seen in the ER again and ruled out for MI.  During his monitoring, he was noted to have predominantly sinus rhythm although he did have one short strip of heart block.  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  Today in follow-up his EKG demonstrates normal sinus rhythm at 76 without ischemic changes.  12/13/2019  Scott Price returns today for follow-up.  When I last saw him I was highly concerned about unstable angina.  He was scheduled the next day for cardiac catheterization.  Cardiac cath on November 22, 2019 showed only mild nonobstructive coronary disease with about 25% stenoses in all 3 coronary vessels.  Normal LVEF of 55 to 65% with normal LVEDP and no evidence of aortic valve stenosis.  Overall very reassuring however since then he has continued to have lifestyle limiting symptoms of chest pain, fatigue, dizziness and shortness of breath.  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  He does not have a primary care provider but did establish with a PCP again at the [facility] hospital but does not have an appointment until December.  He reportedly has a significant smoking  history but quit a number of years ago.  I would estimate between 50 to 75 pack years.  This is concerning for possible COPD as a cause of his symptoms.  Other possible causes could be aneurysm or perhaps pulmonary embolus.  I think that is less likely since his symptoms have been going on for some time but should be considered.  PMHx:  Past Medical History:  Diagnosis Date  . Diabetes mellitus without complication (HCC)   . High cholesterol     Past Surgical History:  Procedure Laterality Date  . BACK SURGERY    . LEFT HEART CATH AND CORONARY ANGIOGRAPHY N/A 11/22/2019   Procedure: LEFT HEART CATH AND CORONARY ANGIOGRAPHY;  Surgeon: Corky Crafts, MD;  Location: [facility] INVASIVE CV LAB;  Service: Cardiovascular;  Laterality: N/A;  . NECK SURGERY      FAMHx:  Family History  Problem Relation Age of Onset  . CAD Brother   . CAD Brother     SOCHx:   reports that he has never smoked. He has never used smokeless tobacco. He reports previous alcohol use. He reports current drug use. Drug: Marijuana.  ALLERGIES:  Allergies  Allergen Reactions  . Doxycycline Other (See Comments)  . Other     Amoxyacillin, gives him a rash   . Prednisone     High blood sugar  . Sulfa Antibiotics Rash    ROS: Pertinent items noted in HPI and remainder of comprehensive ROS  otherwise negative.  HOME MEDS: Current Outpatient Medications on File Prior to Visit  Medication Sig Dispense Refill  . albuterol (VENTOLIN HFA) 108 (90 Base) MCG/ACT inhaler Inhale 1-2 puffs into the lungs every 6 (six) hours as needed for wheezing or shortness of breath.     Marland Kitchen aspirin 81 MG EC tablet Take 81 mg by mouth daily.     . Cholecalciferol (VITAMIN D3) 1.25 MG (50000 UT) TABS Take 1 tablet by mouth daily.    . cyanocobalamin 1000 MCG tablet Take 1,000 mcg by mouth daily.    . ferrous sulfate 325 (65 FE) MG tablet Take 325 mg by mouth daily with breakfast.    . glipiZIDE (GLUCOTROL) 5 MG tablet Take 10 mg by  mouth daily.     Marland Kitchen lisinopril (ZESTRIL) 2.5 MG tablet Take 2.5 mg by mouth daily.     . magnesium oxide (MAG-OX) 400 MG tablet Take 400 mg by mouth 2 (two) times daily.    . meclizine (ANTIVERT) 25 MG tablet Take 25 mg by mouth 3 (three) times daily as needed for dizziness.     . metFORMIN (GLUCOPHAGE) 1000 MG tablet Take 1 tablet (1,000 mg total) by mouth in the morning and at bedtime.    . Multiple Vitamin (MULTI-VITAMIN) tablet Take 1 tablet by mouth daily.    Marland Kitchen omeprazole (PRILOSEC) 20 MG capsule Take 20 mg by mouth every other day.    . rosuvastatin (CRESTOR) 20 MG tablet Take 20 mg by mouth daily.     No current facility-administered medications on file prior to visit.    LABS/IMAGING: No results found for this or any previous visit (from the past 48 hour(s)). No results found.  LIPID PANEL: No results found for: CHOL, TRIG, HDL, CHOLHDL, VLDL, LDLCALC, LDLDIRECT   WEIGHTS: Wt Readings from Last 3 Encounters:  12/13/19 191 lb (86.6 kg)  11/22/19 183 lb (83 kg)  11/21/19 183 lb (83 kg)    VITALS: BP (!) 144/68   Pulse (!) 51   Ht 5\' 9"  (1.753 m)   Wt 191 lb (86.6 kg)   SpO2 96%   BMI 28.21 kg/m   EXAM: General appearance: alert and no distress Neck: no carotid bruit, no JVD and thyroid not enlarged, symmetric, no tenderness/mass/nodules Lungs: rales bibasilar Heart: regular rate and rhythm, S1, S2 normal, no murmur, click, rub or gallop Abdomen: soft, non-tender; bowel sounds normal; no masses,  no organomegaly Extremities: extremities normal, atraumatic, no cyanosis or edema Pulses: 2+ and symmetric Skin: Skin color, texture, turgor normal. No rashes or lesions Neurologic: Grossly normal Psych: Pleasant  EKG: Sinus rhythm with Mobitz 1 AV block -personally reviewed  ASSESSMENT: 1. 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 Intermittent second-degree type I AV block 3. Hypertension 4. Type 2 diabetes 5. Family history of  premature coronary disease in multiple brothers 6. 67-75 pack/yr smoking history, quit  PLAN: 1.   Scott Price had symptoms concerning for unstable angina but was found to only have mild nonobstructive coronary disease by cath in October.  He has persistent Mobitz 1 AV block today.  I did have him wear a monitor to rule out any other high degree AV block which they completed and turned in this past Saturday but has not yet been processed.  I will contact him with those results.  He reports his symptoms are still very bothersome and he is unable to do anything significant.  I am concerned about a smoking  history and whether there is a pulmonary etiology to his symptoms.  I did hear some basilar crackles on exam that sounded dry today concerning for some interstitial fibrosis.  Other possibilities include pulmonary emboli which seem unlikely or aortic aneurysm or dissection.  Would recommend a CT with contrast to rule out PE or dissection and evaluate his lung parenchyma.  I will also order pulmonary function tests, since he does not have any primary care evaluation until December and is significantly symptomatic.  Based on these results, he may need pulmonary referral.  If his symptoms worsen, he is advised to seek out care in the emergency department.  Follow-up with me after the studies.  Chrystie Nose, MD, [facility], FACP  [place]  [facility]  Medical Director of the Advanced Lipid Disorders &  Cardiovascular Risk Reduction Clinic Diplomate of the American Board of Clinical Lipidology Attending Cardiologist  Direct Dial: [phone]  Fax: [phone]  Website:  [email or web address] Nicely Jarick Harkins 12/13/2019, 4:06 PM

## 2019-12-14 ENCOUNTER — Telehealth: Payer: Self-pay | Admitting: Internal Medicine

## 2019-12-14 NOTE — Telephone Encounter (Signed)
Spoke to patient regarding appointment for PFT and COVID prescreening ordered by Dr. Rennis Golden.  PFT scheduled 12/22/19 at 10:00 am at [facility]---arrival time is 9:45 am---1st floor admissions office---COVID prescreening scheduled Monday 12/19/19 at 1:30 pm.  WIll mail information to patient and it is also in My Chart.  Patient voiced his understadning

## 2019-12-15 LAB — BRAIN NATRIURETIC PEPTIDE: BNP: 68.8 pg/mL (ref 0.0–100.0)

## 2019-12-15 LAB — BASIC METABOLIC PANEL
BUN/Creatinine Ratio: 19 (ref 10–24)
BUN: 23 mg/dL (ref 8–27)
CO2: 21 mmol/L (ref 20–29)
Calcium: 10.1 mg/dL (ref 8.6–10.2)
Chloride: 103 mmol/L (ref 96–106)
Creatinine, Ser: 1.24 mg/dL (ref 0.76–1.27)
GFR calc Af Amer: 65 mL/min/{1.73_m2} (ref >59)
GFR calc non Af Amer: 57 mL/min/{1.73_m2} — ABNORMAL LOW (ref >59)
Glucose: 207 mg/dL — ABNORMAL HIGH (ref 65–99)
Potassium: 4.8 mmol/L (ref 3.5–5.2)
Sodium: 141 mmol/L (ref 134–144)

## 2019-12-19 ENCOUNTER — Other Ambulatory Visit: Payer: Self-pay

## 2019-12-19 ENCOUNTER — Telehealth: Payer: Self-pay | Admitting: Internal Medicine

## 2019-12-19 ENCOUNTER — Other Ambulatory Visit (HOSPITAL_COMMUNITY)
Admission: RE | Admit: 2019-12-19 | Discharge: 2019-12-19 | Disposition: A | Discharge status: Home / Self Care | Payer: Medicare HMO | Source: Ambulatory Visit | Attending: Internal Medicine | Admitting: Internal Medicine

## 2019-12-19 ENCOUNTER — Ambulatory Visit (HOSPITAL_BASED_OUTPATIENT_CLINIC_OR_DEPARTMENT_OTHER)
Admission: RE | Admit: 2019-12-19 | Discharge: 2019-12-19 | Disposition: A | Discharge status: Home / Self Care | Payer: Medicare HMO | Source: Ambulatory Visit | Attending: Internal Medicine | Admitting: Internal Medicine

## 2019-12-19 DIAGNOSIS — I2510 Atherosclerotic heart disease of native coronary artery without angina pectoris: Secondary | ICD-10-CM | POA: Insufficient documentation

## 2019-12-19 DIAGNOSIS — Z20822 Contact with and (suspected) exposure to covid-19: Secondary | ICD-10-CM | POA: Diagnosis not present

## 2019-12-19 DIAGNOSIS — R0602 Shortness of breath: Secondary | ICD-10-CM

## 2019-12-19 DIAGNOSIS — I442 Atrioventricular block, complete: Secondary | ICD-10-CM

## 2019-12-19 DIAGNOSIS — Z87891 Personal history of nicotine dependence: Secondary | ICD-10-CM | POA: Insufficient documentation

## 2019-12-19 DIAGNOSIS — R079 Chest pain, unspecified: Secondary | ICD-10-CM

## 2019-12-19 DIAGNOSIS — I700 Atherosclerosis of aorta: Secondary | ICD-10-CM | POA: Insufficient documentation

## 2019-12-19 LAB — SARS CORONAVIRUS 2 (TAT 6-24 HRS): SARS Coronavirus 2: NEGATIVE

## 2019-12-19 MED ORDER — IOHEXOL 350 MG/ML SOLN
100.0000 mL | Freq: Once | INTRAVENOUS | Status: AC | PRN
  Administered 2019-12-19: 100 mL via INTRAVENOUS

## 2019-12-19 NOTE — Telephone Encounter (Signed)
Faxed received from [facility]. Patient scheduled with Dr. Ladona Ridgel.  Will place in his mailbox for review on appointment day.

## 2019-12-19 NOTE — Telephone Encounter (Signed)
   I am working in triage this morning. Reviewed monitor results. Patient has had several episodes of ventricular asystole with pauses ranging from 3.7 to 7 seconds as well as episodes of complete heart block and high grade AV block. He had a total of 100 pauses with longest being 7.4 seconds. Patient did note being symptomatic around these times. Also had brief runs of SVT and possible atrial tachycardia. Called and spoke with patient. He continues to have episodes of chest pain with exertion, shortness of breath, and dizziness. Patient recently had a cardiac catheterization on 11/22/2019 which showed only minimal non-obstructive CAD. LVEF 55-65%. Therefore, do not think chest pain and shortness of breath are anginal equivalent. Patient was recently seen by Dr. Rennis Golden and chest CTA and PFTs were ordered for further evaluation of dyspnea. Chest CTA is scheduled for today.   Patient states symptoms are stable and does not feel like they are getting worse. He did have one episodes of near syncope on Saturday while walking to his male box but no syncope or falls. He assures me that symptoms are stable. He is on no AV nodal agents. There, will place urgent referral to EP for consideration of PPM. Advised patient that if he has any prolonged/worsening symptoms, he should go to the ED. He voiced understanding and agreed.  OK to go ahead and have chest CTA and PFTs.  Discussed with DOD (Dr. Jacques Navy) who agrees with the above plan. I will place urgent referral to EP and route this message to Harrietta Guardian, EP scheduler, so she is aware.   Scott Parker, PA-C 12/19/2019 10:09 AM

## 2019-12-19 NOTE — Telephone Encounter (Signed)
Spoke with Sheralyn Boatman from [facility] regarding critical results for patient. Patient wore Zio Patch from 10/23-11/6. Per Sheralyn Boatman patient had several episodes of Ventricular Asystole with pauses ranging 3.7 seconds to 7  seconds. Patient with several episodes of Complete Heart Block, High Degree AV block, Mobitz II, and bradycardia, with min rate 21. Per Sheralyn Boatman all of these episodes were symptomatic. All together there were 4,150 events of Mobitz and CHB.  Forwarding this message to Marjie Skiff PA-C who is working in Triage today to call patient and will forward to Dr. Jacques Navy who is DOD .

## 2019-12-19 NOTE — Telephone Encounter (Signed)
Scott Price is calling to report a critical result

## 2019-12-21 ENCOUNTER — Encounter: Payer: Self-pay | Admitting: Internal Medicine

## 2019-12-21 ENCOUNTER — Other Ambulatory Visit: Payer: Self-pay

## 2019-12-21 ENCOUNTER — Ambulatory Visit: Payer: Medicare HMO | Admitting: Internal Medicine

## 2019-12-21 DIAGNOSIS — R001 Bradycardia, unspecified: Secondary | ICD-10-CM

## 2019-12-21 LAB — CBC WITH DIFFERENTIAL/PLATELET
Basophils Absolute: 0 10*3/uL (ref 0.0–0.2)
Basos: 0 %
EOS (ABSOLUTE): 0.5 10*3/uL — ABNORMAL HIGH (ref 0.0–0.4)
Eos: 6 %
Hematocrit: 36.1 % — ABNORMAL LOW (ref 37.5–51.0)
Hemoglobin: 11.9 g/dL — ABNORMAL LOW (ref 13.0–17.7)
Lymphocytes Absolute: 1.5 10*3/uL (ref 0.7–3.1)
Lymphs: 18 %
MCH: 30.1 pg (ref 26.6–33.0)
MCHC: 33 g/dL (ref 31.5–35.7)
MCV: 91 fL (ref 79–97)
Monocytes Absolute: 0.9 10*3/uL (ref 0.1–0.9)
Monocytes: 12 %
Neutrophils Absolute: 5.3 10*3/uL (ref 1.4–7.0)
Neutrophils: 64 %
Platelets: 203 10*3/uL (ref 150–450)
RBC: 3.95 x10E6/uL — ABNORMAL LOW (ref 4.14–5.80)
RDW: 13.9 % (ref 11.6–15.4)
WBC: 8.2 10*3/uL (ref 3.4–10.8)

## 2019-12-21 NOTE — Patient Instructions (Addendum)
Medication Instructions:  Your physician recommends that you continue on your current medications as directed. Please refer to the Current Medication list given to you today.  Labwork: You will get lab work today:  CBC  Testing/Procedures: None ordered.  Follow-Up:  SEE INSTRUCTION LETTER  Any Other Special Instructions Will Be Listed Below (If Applicable).  If you need a refill on your cardiac medications before your next appointment, please call your pharmacy.    Pacemaker Implantation, Adult Pacemaker implantation is a procedure to place a pacemaker inside your chest. A pacemaker is a small computer that sends electrical signals to the heart and helps your heart beat normally. A pacemaker also stores information about your heart rhythms. You may need pacemaker implantation if you:  Have a slow heartbeat (bradycardia).  Faint (syncope).  Have shortness of breath (dyspnea) due to heart problems. The pacemaker attaches to your heart through a wire, called a lead. Sometimes just one lead is needed. Other times, there will be two leads. There are two types of pacemakers:  Transvenous pacemaker. This type is placed under the skin or muscle of your chest. The lead goes through a vein in the chest area to reach the inside of the heart.  Epicardial pacemaker. This type is placed under the skin or muscle of your chest or belly. The lead goes through your chest to the outside of the heart. Tell a health care provider about:  Any allergies you have.  All medicines you are taking, including vitamins, herbs, eye drops, creams, and over-the-counter medicines.  Any problems you or family members have had with anesthetic medicines.  Any blood or bone disorders you have.  Any surgeries you have had.  Any medical conditions you have.  Whether you are pregnant or may be pregnant. What are the risks? Generally, this is a safe procedure. However, problems may occur,  including:  Infection.  Bleeding.  Failure of the pacemaker or the lead.  Collapse of a lung or bleeding into a lung.  Blood clot inside a blood vessel with a lead.  Damage to the heart.  Infection inside the heart (endocarditis).  Allergic reactions to medicines. What happens before the procedure? Staying hydrated Follow instructions from your health care provider about hydration, which may include:  Up to 2 hours before the procedure - you may continue to drink clear liquids, such as water, clear fruit juice, black coffee, and plain tea. Eating and drinking restrictions Follow instructions from your health care provider about eating and drinking, which may include:  8 hours before the procedure - stop eating heavy meals or foods such as meat, fried foods, or fatty foods.  6 hours before the procedure - stop eating light meals or foods, such as toast or cereal.  6 hours before the procedure - stop drinking milk or drinks that contain milk.  2 hours before the procedure - stop drinking clear liquids. Medicines  Ask your health care provider about: ? Changing or stopping your regular medicines. This is especially important if you are taking diabetes medicines or blood thinners. ? Taking medicines such as aspirin and ibuprofen. These medicines can thin your blood. Do not take these medicines before your procedure if your health care provider instructs you not to.  You may be given antibiotic medicine to help prevent infection. General instructions  You will have a heart evaluation. This may include an electrocardiogram (ECG), chest X-ray, and heart imaging (echocardiogram,  or echo) tests.  You will have blood tests.    Do not use any products that contain nicotine or tobacco, such as cigarettes and e-cigarettes. If you need help quitting, ask your health care provider.  Plan to have someone take you home from the hospital or clinic.  If you will be going home right after  the procedure, plan to have someone with you for 24 hours.  Ask your health care provider how your surgical site will be marked or identified. What happens during the procedure?  To reduce your risk of infection: ? Your health care team will wash or sanitize their hands. ? Your skin will be washed with soap. ? Hair may be removed from the surgical area.  An IV tube will be inserted into one of your veins.  You will be given one or more of the following: ? A medicine to help you relax (sedative). ? A medicine to numb the area (local anesthetic). ? A medicine to make you fall asleep (general anesthetic).  If you are getting a transvenous pacemaker: ? An incision will be made in your upper chest. ? A pocket will be made for the pacemaker. It may be placed under the skin or between layers of muscle. ? The lead will be inserted into a blood vessel that returns to the heart. ? While X-rays are taken by an imaging machine (fluoroscopy), the lead will be advanced through the vein to the inside of your heart. ? The other end of the lead will be tunneled under the skin and attached to the pacemaker.  If you are getting an epicardial pacemaker: ? An incision will be made near your ribs or breastbone (sternum) for the lead. ? The lead will be attached to the outside of your heart. ? Another incision will be made in your chest or upper belly to create a pocket for the pacemaker. ? The free end of the lead will be tunneled under the skin and attached to the pacemaker.  The transvenous or epicardial pacemaker will be tested. Imaging studies may be done to check the lead position.  The incisions will be closed with stitches (sutures), adhesive strips, or skin glue.  Bandages (dressing) will be placed over the incisions. The procedure may vary among health care providers and hospitals. What happens after the procedure?  Your blood pressure, heart rate, breathing rate, and blood oxygen level will  be monitored until the medicines you were given have worn off.  You will be given antibiotics and pain medicine.  ECG and chest x-rays will be done.  You will wear a continuous type of ECG (Holter monitor) to check your heart rhythm.  Your health care provider will program the pacemaker.  Do not drive for 24 hours if you received a sedative. This information is not intended to replace advice given to you by your health care provider. Make sure you discuss any questions you have with your health care provider. Document Revised: 10/09/2017 Document Reviewed: 07/04/2015 Elsevier Patient Education  2020 Elsevier Inc.  

## 2019-12-21 NOTE — H&P (View-Only) (Signed)
    HPI 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 He has had spells for several weeks where he would feel like he was about to pass out. He wore a cardiac monitor which demonstrates transient CHB with these spells lasting up to 7 seconds with only P waves. He has not actually passed out and he is not driving. He denies chest pain or edema. He underwent left heart cath and has non-obstructive CAD and preserved LV function.  Allergies  Allergen Reactions  . Doxycycline Other (See Comments)  . Other     Amoxyacillin, gives him a rash   . Prednisone     High blood sugar  . Sulfa Antibiotics Rash     Current Outpatient Medications  Medication Sig Dispense Refill  . aspirin 81 MG EC tablet Take 81 mg by mouth daily.     . Cholecalciferol (VITAMIN D3) 1.25 MG (50000 UT) TABS Take 1 tablet by mouth daily.    . cyanocobalamin 1000 MCG tablet Take 1,000 mcg by mouth daily.    . ferrous sulfate 325 (65 FE) MG tablet Take 325 mg by mouth daily with breakfast.    . glipiZIDE (GLUCOTROL) 10 MG tablet Take 10 mg by mouth daily before breakfast.    . lisinopril (ZESTRIL) 2.5 MG tablet Take 2.5 mg by mouth daily.     . magnesium oxide (MAG-OX) 400 MG tablet Take 400 mg by mouth 2 (two) times daily.    . meclizine (ANTIVERT) 25 MG tablet Take 25 mg by mouth 3 (three) times daily as needed for dizziness.     . metFORMIN (GLUCOPHAGE) 1000 MG tablet Take 1 tablet (1,000 mg total) by mouth in the morning and at bedtime.    . Multiple Vitamin (MULTI-VITAMIN) tablet Take 1 tablet by mouth daily.    . omeprazole (PRILOSEC) 20 MG capsule Take 20 mg by mouth every other day.    . rosuvastatin (CRESTOR) 20 MG tablet Take 20 mg by mouth daily.     No current facility-administered medications for this visit.     Past Medical History:  Diagnosis Date  . Diabetes mellitus without complication (HCC)   . High cholesterol     ROS:   All systems reviewed and  negative except as noted in the HPI.   Past Surgical History:  Procedure Laterality Date  . BACK SURGERY    . LEFT HEART CATH AND CORONARY ANGIOGRAPHY N/A 11/22/2019   Procedure: LEFT HEART CATH AND CORONARY ANGIOGRAPHY;  Surgeon: Varanasi, Jayadeep S, MD;  Location: [facility] INVASIVE CV LAB;  Service: Cardiovascular;  Laterality: N/A;  . NECK SURGERY       Family History  Problem Relation Age of Onset  . CAD Brother   . CAD Brother      Social History   Socioeconomic History  . Marital status: Married    Spouse name: Not on file  . Number of children: Not on file  . Years of education: Not on file  . Highest education level: Not on file  Occupational History  . Not on file  Tobacco Use  . Smoking status: Never Smoker  . Smokeless tobacco: Never Used  Substance and Sexual Activity  . Alcohol use: Not Currently  . Drug use: Yes    Types: Marijuana  . Sexual activity: Not on file  Other Topics Concern  . Not on file  Social History Narrative  . Not on file   Social Determinants   of Health   Financial Resource Strain:   . Difficulty of Paying Living Expenses: Not on file  Food Insecurity:   . Worried About Programme researcher, broadcasting/film/video in the Last Year: Not on file  . Ran Out of Food in the Last Year: Not on file  Transportation Needs:   . Lack of Transportation (Medical): Not on file  . Lack of Transportation (Non-Medical): Not on file  Physical Activity:   . Days of Exercise per Week: Not on file  . Minutes of Exercise per Session: Not on file  Stress:   . Feeling of Stress : Not on file  Social Connections:   . Frequency of Communication with Friends and Family: Not on file  . Frequency of Social Gatherings with Friends and Family: Not on file  . Attends Religious Services: Not on file  . Active Member of Clubs or Organizations: Not on file  . Attends Banker Meetings: Not on file  . Marital Status: Not on file  Intimate Partner Violence:   . Fear of  Current or Ex-Partner: Not on file  . Emotionally Abused: Not on file  . Physically Abused: Not on file  . Sexually Abused: Not on file     BP 130/70   Pulse 65   Ht 5\' 9"  (1.753 m)   Wt 193 lb 12.8 oz (87.9 kg)   SpO2 99%   BMI 28.62 kg/m   Physical Exam:  Well appearing NAD HEENT: Unremarkable Neck:  No JVD, no thyromegally Lymphatics:  No adenopathy Back:  No CVA tenderness Lungs:  Clear with no wheezes HEART:  Regular rate rhythm, no murmurs, no rubs, no clicks Abd:  soft, positive bowel sounds, no organomegally, no rebound, no guarding Ext:  2 plus pulses, no edema, no cyanosis, no clubbing Skin:  No rashes no nodules Neuro:  CN II through XII intact, motor grossly intact  EKG - reviewed. NSR with CHB.   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 I have discussed the risks/benefits/goals/expectations of PPM insertion with the patient and he wishes to proceed.  2. Chest pressure - he has non-obstructive disease by heart cath. No change in meds. 3. HTN - his bp is controlled.   Jessly Lebeck,MD

## 2019-12-21 NOTE — Progress Notes (Signed)
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 He has had spells for several weeks where he would feel like he was about to pass out. He wore a cardiac monitor which demonstrates transient CHB with these spells lasting up to 7 seconds with only P waves. He has not actually passed out and he is not driving. He denies chest pain or edema. He underwent left heart cath and has non-obstructive CAD and preserved LV function.  Allergies  Allergen Reactions  . Doxycycline Other (See Comments)  . Other     Amoxyacillin, gives him a rash   . Prednisone     High blood sugar  . Sulfa Antibiotics Rash     Current Outpatient Medications  Medication Sig Dispense Refill  . aspirin 81 MG EC tablet Take 81 mg by mouth daily.     . Cholecalciferol (VITAMIN D3) 1.25 MG (50000 UT) TABS Take 1 tablet by mouth daily.    . cyanocobalamin 1000 MCG tablet Take 1,000 mcg by mouth daily.    . ferrous sulfate 325 (65 FE) MG tablet Take 325 mg by mouth daily with breakfast.    . glipiZIDE (GLUCOTROL) 10 MG tablet Take 10 mg by mouth daily before breakfast.    . lisinopril (ZESTRIL) 2.5 MG tablet Take 2.5 mg by mouth daily.     . magnesium oxide (MAG-OX) 400 MG tablet Take 400 mg by mouth 2 (two) times daily.    . meclizine (ANTIVERT) 25 MG tablet Take 25 mg by mouth 3 (three) times daily as needed for dizziness.     . metFORMIN (GLUCOPHAGE) 1000 MG tablet Take 1 tablet (1,000 mg total) by mouth in the morning and at bedtime.    . Multiple Vitamin (MULTI-VITAMIN) tablet Take 1 tablet by mouth daily.    Marland Kitchen omeprazole (PRILOSEC) 20 MG capsule Take 20 mg by mouth every other day.    . rosuvastatin (CRESTOR) 20 MG tablet Take 20 mg by mouth daily.     No current facility-administered medications for this visit.     Past Medical History:  Diagnosis Date  . Diabetes mellitus without complication (HCC)   . High cholesterol     ROS:   All systems reviewed and  negative except as noted in the HPI.   Past Surgical History:  Procedure Laterality Date  . BACK SURGERY    . LEFT HEART CATH AND CORONARY ANGIOGRAPHY N/A 11/22/2019   Procedure: LEFT HEART CATH AND CORONARY ANGIOGRAPHY;  Surgeon: Corky Crafts, MD;  Location: [facility] INVASIVE CV LAB;  Service: Cardiovascular;  Laterality: N/A;  . NECK SURGERY       Family History  Problem Relation Age of Onset  . CAD Brother   . CAD Brother      Social History   Socioeconomic History  . Marital status: Married    Spouse name: Not on file  . Number of children: Not on file  . Years of education: Not on file  . Highest education level: Not on file  Occupational History  . Not on file  Tobacco Use  . Smoking status: Never Smoker  . Smokeless tobacco: Never Used  Substance and Sexual Activity  . Alcohol use: Not Currently  . Drug use: Yes    Types: Marijuana  . Sexual activity: Not on file  Other Topics Concern  . Not on file  Social History Narrative  . Not on file   Social Determinants  of Health   Financial Resource Strain:   . Difficulty of Paying Living Expenses: Not on file  Food Insecurity:   . Worried About Programme researcher, broadcasting/film/video in the Last Year: Not on file  . Ran Out of Food in the Last Year: Not on file  Transportation Needs:   . Lack of Transportation (Medical): Not on file  . Lack of Transportation (Non-Medical): Not on file  Physical Activity:   . Days of Exercise per Week: Not on file  . Minutes of Exercise per Session: Not on file  Stress:   . Feeling of Stress : Not on file  Social Connections:   . Frequency of Communication with Friends and Family: Not on file  . Frequency of Social Gatherings with Friends and Family: Not on file  . Attends Religious Services: Not on file  . Active Member of Clubs or Organizations: Not on file  . Attends Banker Meetings: Not on file  . Marital Status: Not on file  Intimate Partner Violence:   . Fear of  Current or Ex-Partner: Not on file  . Emotionally Abused: Not on file  . Physically Abused: Not on file  . Sexually Abused: Not on file     BP 130/70   Pulse 65   Ht 5\' 9"  (1.753 m)   Wt 193 lb 12.8 oz (87.9 kg)   SpO2 99%   BMI 28.62 kg/m   Physical Exam:  Well appearing NAD HEENT: Unremarkable Neck:  No JVD, no thyromegally Lymphatics:  No adenopathy Back:  No CVA tenderness Lungs:  Clear with no wheezes HEART:  Regular rate rhythm, no murmurs, no rubs, no clicks Abd:  soft, positive bowel sounds, no organomegally, no rebound, no guarding Ext:  2 plus pulses, no edema, no cyanosis, no clubbing Skin:  No rashes no nodules Neuro:  CN II through XII intact, motor grossly intact  EKG - reviewed. NSR with CHB.   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 I have discussed the risks/benefits/goals/expectations of PPM insertion with the patient and he wishes to proceed.  2. Chest pressure - he has non-obstructive disease by heart cath. No change in meds. 3. HTN - his bp is controlled.   Lashunta Frieden,MD

## 2019-12-22 ENCOUNTER — Inpatient Hospital Stay (HOSPITAL_COMMUNITY): Admission: RE | Admit: 2019-12-22 | Payer: Medicare HMO | Source: Ambulatory Visit

## 2019-12-23 ENCOUNTER — Other Ambulatory Visit (HOSPITAL_COMMUNITY)
Admission: RE | Admit: 2019-12-23 | Discharge: 2019-12-23 | Disposition: A | Discharge status: Home / Self Care | Payer: Medicare HMO | Source: Ambulatory Visit | Attending: Internal Medicine | Admitting: Internal Medicine

## 2019-12-23 DIAGNOSIS — Z20822 Contact with and (suspected) exposure to covid-19: Secondary | ICD-10-CM | POA: Diagnosis not present

## 2019-12-23 DIAGNOSIS — Z01812 Encounter for preprocedural laboratory examination: Secondary | ICD-10-CM | POA: Diagnosis present

## 2019-12-23 LAB — SARS CORONAVIRUS 2 (TAT 6-24 HRS): SARS Coronavirus 2: NEGATIVE

## 2019-12-26 ENCOUNTER — Ambulatory Visit (HOSPITAL_COMMUNITY)
Admission: RE | Admit: 2019-12-26 | Discharge: 2019-12-27 | Disposition: A | Discharge status: Home / Self Care | Payer: Medicare HMO | Attending: Internal Medicine | Admitting: Internal Medicine

## 2019-12-26 ENCOUNTER — Ambulatory Visit (HOSPITAL_COMMUNITY)
Admission: RE | Disposition: A | Discharge status: Home / Self Care | Payer: Self-pay | Source: Home / Self Care | Attending: Internal Medicine

## 2019-12-26 DIAGNOSIS — I442 Atrioventricular block, complete: Secondary | ICD-10-CM | POA: Diagnosis present

## 2019-12-26 DIAGNOSIS — Z882 Allergy status to sulfonamides status: Secondary | ICD-10-CM | POA: Insufficient documentation

## 2019-12-26 DIAGNOSIS — I10 Essential (primary) hypertension: Secondary | ICD-10-CM | POA: Diagnosis not present

## 2019-12-26 DIAGNOSIS — Z7982 Long term (current) use of aspirin: Secondary | ICD-10-CM | POA: Diagnosis not present

## 2019-12-26 DIAGNOSIS — R0789 Other chest pain: Secondary | ICD-10-CM | POA: Diagnosis not present

## 2019-12-26 DIAGNOSIS — Z881 Allergy status to other antibiotic agents status: Secondary | ICD-10-CM | POA: Insufficient documentation

## 2019-12-26 DIAGNOSIS — I2510 Atherosclerotic heart disease of native coronary artery without angina pectoris: Secondary | ICD-10-CM | POA: Insufficient documentation

## 2019-12-26 DIAGNOSIS — Z888 Allergy status to other drugs, medicaments and biological substances status: Secondary | ICD-10-CM | POA: Insufficient documentation

## 2019-12-26 DIAGNOSIS — Z7984 Long term (current) use of oral hypoglycemic drugs: Secondary | ICD-10-CM | POA: Insufficient documentation

## 2019-12-26 DIAGNOSIS — Z79899 Other long term (current) drug therapy: Secondary | ICD-10-CM | POA: Diagnosis not present

## 2019-12-26 DIAGNOSIS — Z950 Presence of cardiac pacemaker: Secondary | ICD-10-CM

## 2019-12-26 HISTORY — PX: PACEMAKER IMPLANT: EP1218

## 2019-12-26 LAB — GLUCOSE, CAPILLARY
Glucose-Capillary: 221 mg/dL — ABNORMAL HIGH (ref 70–99)
Glucose-Capillary: 220 mg/dL — ABNORMAL HIGH (ref 70–99)

## 2019-12-26 SURGERY — PACEMAKER IMPLANT

## 2019-12-26 MED ORDER — ONDANSETRON HCL 4 MG/2ML IJ SOLN
4.0000 mg | Freq: Four times a day (QID) | INTRAMUSCULAR | Status: DC | PRN
  Administered 2019-12-27: 4 mg via INTRAVENOUS
  Filled 2019-12-26: qty 2

## 2019-12-26 MED ORDER — CEFAZOLIN SODIUM-DEXTROSE 2-4 GM/100ML-% IV SOLN
2.0000 g | INTRAVENOUS | Status: AC
  Administered 2019-12-26: 2 g via INTRAVENOUS

## 2019-12-26 MED ORDER — CEFAZOLIN SODIUM-DEXTROSE 1-4 GM/50ML-% IV SOLN
1.0000 g | Freq: Four times a day (QID) | INTRAVENOUS | Status: AC
  Administered 2019-12-26 – 2019-12-27 (×3): 1 g via INTRAVENOUS
  Filled 2019-12-26 (×3): qty 50

## 2019-12-26 MED ORDER — LIDOCAINE HCL 1 % IJ SOLN
INTRAMUSCULAR | Status: AC
  Filled 2019-12-26: qty 60

## 2019-12-26 MED ORDER — CEFAZOLIN SODIUM-DEXTROSE 2-3 GM-%(50ML) IV SOLR: INTRAVENOUS | Status: DC | PRN

## 2019-12-26 MED ORDER — MECLIZINE HCL 25 MG PO TABS: 25.0000 mg | ORAL_TABLET | Freq: Three times a day (TID) | ORAL | Status: DC | PRN

## 2019-12-26 MED ORDER — LISINOPRIL 5 MG PO TABS
2.5000 mg | ORAL_TABLET | Freq: Every day | ORAL | Status: DC
  Administered 2019-12-26 – 2019-12-27 (×2): 2.5 mg via ORAL
  Filled 2019-12-26 (×2): qty 1

## 2019-12-26 MED ORDER — ACETAMINOPHEN 325 MG PO TABS
325.0000 mg | ORAL_TABLET | ORAL | Status: DC | PRN
  Administered 2019-12-26 – 2019-12-27 (×2): 650 mg via ORAL
  Filled 2019-12-26 (×2): qty 2

## 2019-12-26 MED ORDER — VITAMIN B-12 1000 MCG PO TABS
1000.0000 ug | ORAL_TABLET | Freq: Every day | ORAL | Status: DC
  Administered 2019-12-26 – 2019-12-27 (×2): 1000 ug via ORAL
  Filled 2019-12-26 (×2): qty 1

## 2019-12-26 MED ORDER — VITAMIN D 25 MCG (1000 UNIT) PO TABS
5000.0000 [IU] | ORAL_TABLET | Freq: Every day | ORAL | Status: DC
  Administered 2019-12-26 – 2019-12-27 (×2): 5000 [IU] via ORAL
  Filled 2019-12-26 (×2): qty 5

## 2019-12-26 MED ORDER — MIDAZOLAM HCL 5 MG/5ML IJ SOLN
INTRAMUSCULAR | Status: AC
  Filled 2019-12-26: qty 5

## 2019-12-26 MED ORDER — FENTANYL CITRATE (PF) 100 MCG/2ML IJ SOLN
INTRAMUSCULAR | Status: DC | PRN
  Administered 2019-12-26: 12.5 ug via INTRAVENOUS
  Administered 2019-12-26: 25 ug via INTRAVENOUS

## 2019-12-26 MED ORDER — ROSUVASTATIN CALCIUM 20 MG PO TABS
20.0000 mg | ORAL_TABLET | Freq: Every day | ORAL | Status: DC
  Administered 2019-12-26: 20 mg via ORAL
  Filled 2019-12-26: qty 1

## 2019-12-26 MED ORDER — METFORMIN HCL 500 MG PO TABS
1000.0000 mg | ORAL_TABLET | Freq: Every day | ORAL | Status: DC
  Administered 2019-12-27: 1000 mg via ORAL
  Filled 2019-12-26: qty 2

## 2019-12-26 MED ORDER — HEPARIN (PORCINE) IN NACL 1000-0.9 UT/500ML-% IV SOLN
INTRAVENOUS | Status: DC | PRN
  Administered 2019-12-26: 500 mL

## 2019-12-26 MED ORDER — ADULT MULTIVITAMIN W/MINERALS CH
1.0000 | ORAL_TABLET | Freq: Every day | ORAL | Status: DC
  Administered 2019-12-26 – 2019-12-27 (×2): 1 via ORAL
  Filled 2019-12-26 (×2): qty 1

## 2019-12-26 MED ORDER — LIDOCAINE HCL (PF) 1 % IJ SOLN
INTRAMUSCULAR | Status: DC | PRN
  Administered 2019-12-26: 45 mL

## 2019-12-26 MED ORDER — HEPARIN (PORCINE) IN NACL 1000-0.9 UT/500ML-% IV SOLN
INTRAVENOUS | Status: AC
  Filled 2019-12-26: qty 500

## 2019-12-26 MED ORDER — SODIUM CHLORIDE 0.9 % IV SOLN
80.0000 mg | INTRAVENOUS | Status: AC
  Administered 2019-12-26: 80 mg

## 2019-12-26 MED ORDER — CEFAZOLIN SODIUM-DEXTROSE 2-4 GM/100ML-% IV SOLN
INTRAVENOUS | Status: AC
  Filled 2019-12-26: qty 100

## 2019-12-26 MED ORDER — FENTANYL CITRATE (PF) 100 MCG/2ML IJ SOLN
INTRAMUSCULAR | Status: AC
  Filled 2019-12-26: qty 2

## 2019-12-26 MED ORDER — SODIUM CHLORIDE 0.9 % IV SOLN
INTRAVENOUS | Status: AC
  Filled 2019-12-26: qty 2

## 2019-12-26 MED ORDER — MIDAZOLAM HCL 5 MG/5ML IJ SOLN
INTRAMUSCULAR | Status: DC | PRN
  Administered 2019-12-26: 2 mg via INTRAVENOUS
  Administered 2019-12-26: 1 mg via INTRAVENOUS

## 2019-12-26 MED ORDER — MAGNESIUM OXIDE 400 (241.3 MG) MG PO TABS
400.0000 mg | ORAL_TABLET | Freq: Two times a day (BID) | ORAL | Status: DC
  Administered 2019-12-26 – 2019-12-27 (×2): 400 mg via ORAL
  Filled 2019-12-26 (×2): qty 1

## 2019-12-26 MED ORDER — FERROUS SULFATE 325 (65 FE) MG PO TABS
325.0000 mg | ORAL_TABLET | Freq: Every day | ORAL | Status: DC
  Administered 2019-12-27: 325 mg via ORAL
  Filled 2019-12-26: qty 1

## 2019-12-26 MED ORDER — GLIPIZIDE 10 MG PO TABS
10.0000 mg | ORAL_TABLET | Freq: Every day | ORAL | Status: DC
  Administered 2019-12-27: 10 mg via ORAL
  Filled 2019-12-26: qty 1

## 2019-12-26 MED ORDER — CHLORHEXIDINE GLUCONATE 4 % EX LIQD: 4.0000 "application " | Freq: Once | CUTANEOUS | Status: DC

## 2019-12-26 MED ORDER — SODIUM CHLORIDE 0.9 % IV SOLN: INTRAVENOUS | Status: DC

## 2019-12-26 MED ORDER — ASPIRIN EC 81 MG PO TBEC
81.0000 mg | DELAYED_RELEASE_TABLET | Freq: Every day | ORAL | Status: DC
  Administered 2019-12-26 – 2019-12-27 (×2): 81 mg via ORAL
  Filled 2019-12-26 (×2): qty 1

## 2019-12-26 SURGICAL SUPPLY — 11 items
CABLE SURGICAL S-101-97-12 (CABLE) ×3 IMPLANT
CATH SELECT PACE 669183 (CATHETERS) ×3 IMPLANT
CUTTER LV DELIVERY CATHETER 7 (MISCELLANEOUS) ×2 IMPLANT
LEAD INGEVITY 7841 52 (Lead) ×2 IMPLANT
LEAD INGEVITY 7842 59 (Lead) ×2 IMPLANT
PACEMAKER ACCOLADE DR-EL (Pacemaker) ×2 IMPLANT
PAD PRO RADIOLUCENT 2001M-C (PAD) ×3 IMPLANT
SHEATH 7FR PRELUDE SNAP 13 (SHEATH) ×3 IMPLANT
SHEATH 8FR PRELUDE SNAP 13 (SHEATH) ×3 IMPLANT
TRAY PACEMAKER INSERTION (PACKS) ×3 IMPLANT
WIRE HI TORQ VERSACORE-J 145CM (WIRE) ×2 IMPLANT

## 2019-12-26 NOTE — Interval H&P Note (Signed)
History and Physical Interval Note:  12/26/2019 2:57 PM  Scott Price  has presented today for surgery, with the diagnosis of bradycardia.  The various methods of treatment have been discussed with the patient and family. After consideration of risks, benefits and other options for treatment, the patient has consented to  Procedure(s): PACEMAKER IMPLANT (N/A) as a surgical intervention.  The patient's history has been reviewed, patient examined, no change in status, stable for surgery.  I have reviewed the patient's chart and labs.  Questions were answered to the patient's satisfaction.     Lewayne Bunting

## 2019-12-26 NOTE — Discharge Summary (Addendum)
ELECTROPHYSIOLOGY PROCEDURE DISCHARGE SUMMARY    [name],  MRN: [id number], DOB/AGE: [birth year] 74 y.o.  Admit date: 12/26/2019 Discharge date: 12/27/19  Primary Care Physician: [facility]  Primary Cardiologist: Dr. Rennis Golden Electrophysiologist: Dr. Ladona Ridgel  Primary Discharge Diagnosis:  1. CHB  Secondary Discharge Diagnosis:  1. DM 2. Dyslipidemia  Allergies  Allergen Reactions   Doxycycline Shortness Of Breath and Rash   Amoxicillin Rash   Prednisone     High blood sugar   Sulfa Antibiotics Rash    Headache/ Chest tightness/ stiff neck     Procedures This Admission:  1.  Implantation of a BSCi dual chamber PPM on 12/26/19 by Dr Ladona Ridgel.  The patient received a [employer] (serial number [id number]) pacemaker, [employer] (serial number [id number]) right atrial lead and a [employer] (serial number [id number]) right ventricular lead. There were no immediate post procedure complications. CXR on 12/27/19 demonstrated no pneumothorax status post device implantation.   Brief HPI: Scott Price is a 74 y.o. male was referred to electrophysiology in the outpatient setting for consideration of PPM implantation.  Past medical history includes above.  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  Risks, benefits, and alternatives to PPM implantation were reviewed with the patient who wished to proceed.   Hospital Course:  The patient was admitted and underwent implantation of a PPM with details as outlined above.  He was monitored on telemetry overnight which demonstrated nsr.  Left chest was without hematoma or ecchymosis.  The device was interrogated and found to be functioning normally.  CXR was obtained and demonstrated no pneumothorax status post device implantation.  Wound care, arm mobility, and restrictions were reviewed with the patient.  The patient  feels well, denies any CP/SOB, with  minimal site discomfort.  He was examined by  Dr. Rosette Reveal and considered stable for discharge to home.    Physical Exam: Vitals:   12/26/19 2052 12/27/19 0022 12/27/19 0455 12/27/19 0800  BP: 139/66 137/66 (!) 150/73 (!) 161/60  Pulse: 76 67 79 76  Resp: 16 15 17 16   Temp: 97.6 F (36.4 C) 97.6 F (36.4 C) 97.8 F (36.6 C)   TempSrc: Oral Oral Oral   SpO2: 97% 97% 98% 99%  Weight:   83.7 kg   Height:        GEN- The patient is well appearing, alert and oriented x 3 today.   HEENT: normocephalic, atraumatic; sclera clear, conjunctiva pink; hearing intact; oropharynx clear; neck supple, no JVP Lungs-  CTA b/l, normal work of breathing.  No wheezes, rales, rhonchi Heart- , no murmurs, rubs or gallops, PMI not laterally displaced GI- soft, non-tender, non-distended Extremities- no clubbing, cyanosis, or edema MS- no significant deformity or atrophy Skin- warm and dry, no rash or lesion,  left chest without hematoma/ecchymosis Psych- euthymic mood, full affect Neuro- no gross deficits   Labs:   Lab Results  Component Value Date   WBC 8.2 12/21/2019   HGB 11.9 (L) 12/21/2019   HCT 36.1 (L) 12/21/2019   MCV 91 12/21/2019   PLT 203 12/21/2019   No results for input(s): NA, K, CL, CO2, BUN, CREATININE, CALCIUM, PROT, BILITOT, ALKPHOS, ALT, AST, GLUCOSE in the last 168 hours.  Invalid input(s): LABALBU  Discharge Medications:  Allergies as of 12/27/2019       Reactions   Doxycycline Shortness Of Breath, Rash   Amoxicillin Rash   Prednisone    High blood sugar  Sulfa Antibiotics Rash   Headache/ Chest tightness/ stiff neck        Medication List     TAKE these medications    aspirin 81 MG EC tablet Take 81 mg by mouth daily.   cyanocobalamin 1000 MCG tablet Take 1,000 mcg by mouth daily.   ferrous sulfate 325 (65 FE) MG tablet Take 325 mg by mouth daily with breakfast.   glipiZIDE 10 MG tablet Commonly known as: GLUCOTROL Take 10 mg by mouth daily before breakfast.   lisinopril 5 MG  tablet Commonly known as: ZESTRIL Take 2.5 mg by mouth daily.   magnesium oxide 400 MG tablet Commonly known as: MAG-OX Take 400 mg by mouth 2 (two) times daily.   meclizine 25 MG tablet Commonly known as: ANTIVERT Take 25 mg by mouth 3 (three) times daily as needed for dizziness.   metFORMIN 1000 MG tablet Commonly known as: GLUCOPHAGE Take 1 tablet (1,000 mg total) by mouth in the morning and at bedtime.   Multi-Vitamin tablet Take 1 tablet by mouth daily.   omeprazole 20 MG capsule Commonly known as: PRILOSEC Take 20 mg by mouth See admin instructions. Every other night   rosuvastatin 20 MG tablet Commonly known as: CRESTOR Take 20 mg by mouth at bedtime.   Vitamin D3 1.25 MG (50000 UT) Tabs Take 5,000 Units by mouth daily.               Discharge Care Instructions  (From admission, onward)           Start     Ordered   12/27/19 0000  Discharge wound care:       Comments: As noted in the AVS   12/27/19 0834            Disposition:  Discharge Instructions     Diet - low sodium heart healthy   Complete by: As directed    Discharge wound care:   Complete by: As directed    As noted in the AVS   Increase activity slowly   Complete by: As directed        Follow-up Information     [facility] Follow up.   Specialty: Cardiology Why: 01/05/2020 @ 10:00AM, wound check visit Contact information: [address] [place] [address] [phone]        Marinus Maw, MD Follow up.   Specialty: Cardiology Why: 04/02/2020 @ 3:00PM Contact information: [address] [place] [address] [phone]                 Duration of Discharge Encounter: Greater than 30 minutes including physician time.  Scott Fredrickson, PA-C 12/27/2019 8:34 AM  EP Attending  Patient seen and examined. Agree with above. The patient is doing well after late in the day PPM  yesterday. Interrogation of his PPM under my direction demonstrates normal DDD PM function. He will be discharged home with usual followup.   Sharlot Gowda Marcelus Dubberly,MD

## 2019-12-27 ENCOUNTER — Ambulatory Visit (HOSPITAL_COMMUNITY): Payer: Medicare HMO

## 2019-12-27 ENCOUNTER — Encounter (HOSPITAL_COMMUNITY): Payer: Self-pay | Admitting: Internal Medicine

## 2019-12-27 DIAGNOSIS — I10 Essential (primary) hypertension: Secondary | ICD-10-CM | POA: Diagnosis not present

## 2019-12-27 DIAGNOSIS — I442 Atrioventricular block, complete: Secondary | ICD-10-CM | POA: Diagnosis not present

## 2019-12-27 DIAGNOSIS — I2510 Atherosclerotic heart disease of native coronary artery without angina pectoris: Secondary | ICD-10-CM | POA: Diagnosis not present

## 2019-12-27 DIAGNOSIS — R0789 Other chest pain: Secondary | ICD-10-CM | POA: Diagnosis not present

## 2019-12-27 LAB — GLUCOSE, CAPILLARY: Glucose-Capillary: 201 mg/dL — ABNORMAL HIGH (ref 70–99)

## 2019-12-27 MED FILL — Lidocaine HCl Local Inj 1%: INTRAMUSCULAR | Qty: 60 | Status: AC

## 2019-12-27 NOTE — Plan of Care (Signed)
  Problem: Education: Goal: Knowledge of General Education information will improve Description: Including pain rating scale, medication(s)/side effects and non-pharmacologic comfort measures Outcome: Progressing   Problem: Health Behavior/Discharge Planning: Goal: Ability to manage health-related needs will improve Outcome: Progressing   Problem: Clinical Measurements: Goal: Ability to maintain clinical measurements within normal limits will improve Outcome: Progressing Goal: Will remain free from infection Outcome: Progressing Goal: Cardiovascular complication will be avoided Outcome: Progressing   Problem: Pain Managment: Goal: General experience of comfort will improve Outcome: Progressing

## 2019-12-27 NOTE — Discharge Instructions (Signed)
    Supplemental Discharge Instructions for  Pacemaker/Defibrillator Patients   Activity No heavy lifting or vigorous activity with your left/right arm for 6 to 8 weeks.  Do not raise your left/right arm above your head for one week.  Gradually raise your affected arm as drawn below.             12/31/2019               01/01/2020              01/02/2020             01/03/2020 __  NO DRIVING for  1 week   ; you may begin driving on   23/30/0762  .  WOUND CARE - Keep the wound area clean and dry.  Do not get this area wet , no showers for one week; you may shower on 01/03/2020  . - Tomorrow, 12/27/2019, remove the arm sling - Tomorrow, 12/27/2019,  remove the outer plastic bandage.  Underneath the plastic bandage there are steri strips (paper tapes), DO NOT remove these. - The tape/steri-strips on your wound will fall off; do not pull them off.  No bandage is needed on the site.  DO  NOT apply any creams, oils, or ointments to the wound area. - If you notice any drainage or discharge from the wound, any swelling or bruising at the site, or you develop a fever > 101? F after you are discharged home, call the office at once.  Special Instructions - You are still able to use cellular telephones; use the ear opposite the side where you have your pacemaker/defibrillator.  Avoid carrying your cellular phone near your device. - When traveling through airports, show security personnel your identification card to avoid being screened in the metal detectors.  Ask the security personnel to use the hand wand. - Avoid arc welding equipment, MRI testing (magnetic resonance imaging), TENS units (transcutaneous nerve stimulators).  Call the office for questions about other devices. - Avoid electrical appliances that are in poor condition or are not properly grounded. - Microwave ovens are safe to be near or to operate.

## 2020-01-05 ENCOUNTER — Other Ambulatory Visit: Payer: Self-pay

## 2020-01-05 ENCOUNTER — Ambulatory Visit (INDEPENDENT_AMBULATORY_CARE_PROVIDER_SITE_OTHER): Payer: Medicare HMO | Admitting: Emergency Medicine

## 2020-01-05 DIAGNOSIS — I442 Atrioventricular block, complete: Secondary | ICD-10-CM | POA: Diagnosis not present

## 2020-01-05 DIAGNOSIS — Z950 Presence of cardiac pacemaker: Secondary | ICD-10-CM | POA: Diagnosis not present

## 2020-01-05 LAB — CUP PACEART INCLINIC DEVICE CHECK
Brady Statistic RA Percent Paced: 2 %
Brady Statistic RV Percent Paced: 61 %
Date Time Interrogation Session: 20211202000000
Implantable Lead Implant Date: 20211122
Implantable Lead Implant Date: 20211122
Implantable Lead Location: 753859
Implantable Lead Location: 753860
Implantable Lead Model: 7841
Implantable Lead Model: 7842
Implantable Lead Serial Number: 1057413
Implantable Lead Serial Number: 1098402
Implantable Pulse Generator Implant Date: 20211122
Lead Channel Impedance Value: 664 Ohm
Lead Channel Impedance Value: 701 Ohm
Lead Channel Pacing Threshold Amplitude: 0.5 V
Lead Channel Pacing Threshold Amplitude: 0.8 V
Lead Channel Pacing Threshold Pulse Width: 0.4 ms
Lead Channel Pacing Threshold Pulse Width: 0.4 ms
Lead Channel Sensing Intrinsic Amplitude: 6.4 mV
Lead Channel Setting Pacing Amplitude: 3.5 V
Lead Channel Setting Pacing Amplitude: 3.5 V
Lead Channel Setting Pacing Pulse Width: 0.4 ms
Lead Channel Setting Sensing Sensitivity: 2.5 mV
Pulse Gen Serial Number: 962270

## 2020-01-05 NOTE — Progress Notes (Signed)

## 2020-01-23 NOTE — Telephone Encounter (Signed)
Spoke with patient about messages sent re: CT test He prefers to wait and discuss CT and recommendations with MD at Jan 2022 visit

## 2020-03-05 ENCOUNTER — Ambulatory Visit: Payer: Medicare HMO | Admitting: Internal Medicine

## 2020-03-05 ENCOUNTER — Other Ambulatory Visit: Payer: Self-pay

## 2020-03-05 ENCOUNTER — Encounter: Payer: Self-pay | Admitting: Internal Medicine

## 2020-03-05 VITALS — BP 124/68 | HR 78 | Ht 69.0 in | Wt 199.2 lb

## 2020-03-05 DIAGNOSIS — I2510 Atherosclerotic heart disease of native coronary artery without angina pectoris: Secondary | ICD-10-CM

## 2020-03-05 DIAGNOSIS — E782 Mixed hyperlipidemia: Secondary | ICD-10-CM

## 2020-03-05 DIAGNOSIS — Z87891 Personal history of nicotine dependence: Secondary | ICD-10-CM

## 2020-03-05 DIAGNOSIS — I10 Essential (primary) hypertension: Secondary | ICD-10-CM | POA: Diagnosis not present

## 2020-03-05 DIAGNOSIS — I442 Atrioventricular block, complete: Secondary | ICD-10-CM

## 2020-03-05 DIAGNOSIS — Z950 Presence of cardiac pacemaker: Secondary | ICD-10-CM | POA: Diagnosis not present

## 2020-03-05 NOTE — Progress Notes (Signed)
OFFICE NOTE  Chief Complaint:  Follow-up  Primary Care Physician: [facility]  HPI:  Scott Price is a 75 y.o. male with a past medial history significant for type 2 diabetes and dyslipidemia with a family history of heart disease in multiple brothers.  Scott Price was seen over the weekend in the [facility], ER with progressive chest discomfort shortness of breath and dizziness.  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  He says during these episodes he gets chest tightness, fatigue and dizziness and has to sit down with some relief but his symptoms have been worsening.  He was seen in the ER again and ruled out for MI.  During his monitoring, he was noted to have predominantly sinus rhythm although he did have one short strip of heart block.  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  Today in follow-up his EKG demonstrates normal sinus rhythm at 76 without ischemic changes.  12/13/2019  Scott Price returns today for follow-up.  When I last saw him I was highly concerned about unstable angina.  He was scheduled the next day for cardiac catheterization.  Cardiac cath on November 22, 2019 showed only mild nonobstructive coronary disease with about 25% stenoses in all 3 coronary vessels.  Normal LVEF of 55 to 65% with normal LVEDP and no evidence of aortic valve stenosis.  Overall very reassuring however since then he has continued to have lifestyle limiting symptoms of chest pain, fatigue, dizziness and shortness of breath.  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  He does not have a primary care provider but did establish with a PCP again at the [facility] hospital but does not have an appointment until December.  He reportedly has a significant smoking history but quit a  number of years ago.  I would estimate between 50 to 75 pack years.  This is concerning for possible COPD as a cause of his symptoms.  Other possible causes could be aneurysm or perhaps pulmonary embolus.  I think that is less likely since his symptoms have been going on for some time but should be considered.  03/05/2020  Scott Price returns today for follow-up.  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  Prior to that he had a heart catheterization in October 2021 which showed nonobstructive multivessel coronary disease.  EF was normal.  He does have coronary calcification.  Since he had his pacemaker placed he is done very well.  A dual-chamber [employer] device.  He did not receive a remote monitor but is scheduled for remote pacer check in February.  He also has follow-up with Dr. Ladona Ridgel on February 28.  His CT scan did show honeycombing and some signs of interstitial lung disease in November.  I advised him to get pulmonary function testing and was again referred him to pulmonary however he would like to do this testing and follow-up through the [facility] health system.  Currently seeing a provider there to schedule this.  PMHx:  Past Medical History:  Diagnosis Date  . Diabetes mellitus without complication (HCC)   . High cholesterol     Past Surgical History:  Procedure Laterality Date  .  BACK SURGERY    . LEFT HEART CATH AND CORONARY ANGIOGRAPHY N/A 11/22/2019   Procedure: LEFT HEART CATH AND CORONARY ANGIOGRAPHY;  Surgeon: Corky Crafts, MD;  Location: [facility] INVASIVE CV LAB;  Service: Cardiovascular;  Laterality: N/A;  . NECK SURGERY    . PACEMAKER IMPLANT N/A 12/26/2019   Procedure: PACEMAKER IMPLANT;  Surgeon: Marinus Maw, MD;  Location: [facility] INVASIVE CV LAB;  Service: Cardiovascular;  Laterality: N/A;    FAMHx:  Family History  Problem Relation Age of Onset  . CAD Brother   . CAD Brother      SOCHx:   reports that he has never smoked. He has never used smokeless tobacco. He reports previous alcohol use. He reports current drug use. Drug: Marijuana.  ALLERGIES:  Allergies  Allergen Reactions  . Doxycycline Shortness Of Breath and Rash  . Amoxicillin Rash  . Prednisone     High blood sugar  . Sulfa Antibiotics Rash    Headache/ Chest tightness/ stiff neck    ROS: Pertinent items noted in HPI and remainder of comprehensive ROS otherwise negative.  HOME MEDS: Current Outpatient Medications on File Prior to Visit  Medication Sig Dispense Refill  . aspirin 81 MG EC tablet Take 81 mg by mouth daily.     . Cholecalciferol (VITAMIN D3) 1.25 MG (50000 UT) TABS Take 5,000 Units by mouth daily.     . cyanocobalamin 1000 MCG tablet Take 1,000 mcg by mouth daily.    . ferrous sulfate 325 (65 FE) MG tablet Take 325 mg by mouth daily with breakfast.    . glipiZIDE (GLUCOTROL) 10 MG tablet Take 10 mg by mouth daily before breakfast.    . lisinopril (ZESTRIL) 5 MG tablet Take 2.5 mg by mouth daily.     . magnesium oxide (MAG-OX) 400 MG tablet Take 400 mg by mouth 2 (two) times daily.    . metFORMIN (GLUCOPHAGE) 1000 MG tablet Take 1 tablet (1,000 mg total) by mouth in the morning and at bedtime.    . Multiple Vitamin (MULTI-VITAMIN) tablet Take 1 tablet by mouth daily.    Marland Kitchen omeprazole (PRILOSEC) 20 MG capsule Take 20 mg by mouth See admin instructions. Every other night    . rosuvastatin (CRESTOR) 20 MG tablet Take 20 mg by mouth at bedtime.     . meclizine (ANTIVERT) 25 MG tablet Take 25 mg by mouth 3 (three) times daily as needed for dizziness.  (Patient not taking: Reported on 03/05/2020)     No current facility-administered medications on file prior to visit.    LABS/IMAGING: No results found for this or any previous visit (from the past 48 hour(s)). No results found.  LIPID PANEL: No results found for: CHOL, TRIG, HDL, CHOLHDL, VLDL, LDLCALC, LDLDIRECT   WEIGHTS: Wt  Readings from Last 3 Encounters:  03/05/20 199 lb 3.2 oz (90.4 kg)  12/27/19 184 lb 8 oz (83.7 kg)  12/21/19 193 lb 12.8 oz (87.9 kg)    VITALS: BP 124/68 (BP Location: Left Arm, Patient Position: Sitting)   Pulse 78   Ht 5\' 9"  (1.753 m)   Wt 199 lb 3.2 oz (90.4 kg)   SpO2 97%   BMI 29.42 kg/m   EXAM: General appearance: alert and no distress Neck: no carotid bruit, no JVD and thyroid not enlarged, symmetric, no tenderness/mass/nodules Lungs: rales bibasilar Heart: regular rate and rhythm, S1, S2 normal, no murmur, click, rub or gallop Abdomen: soft, non-tender; bowel sounds normal; no masses,  no organomegaly Extremities:  extremities normal, atraumatic, no cyanosis or edema Pulses: 2+ and symmetric Skin: Skin color, texture, turgor normal. No rashes or lesions Neurologic: Grossly normal Psych: Pleasant  EKG: Deferred  ASSESSMENT: 1. 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 Imaging evidence of possible interstitial lung disease by CT (12/2019) 4. Hypertension 5. Type 2 diabetes 6. Family history of premature coronary disease in multiple brothers 7. 26-75 pack/yr smoking history, quit  PLAN: 1.   Scott Price has done better since placement of a pacemaker for complete heart block.  He did have some mild nonobstructive disease by cath but is on good medical therapy including a statin, aspirin and has good blood pressure control.  There is a long smoking history including 50 to 75 pack years.  His CT showed interstitial lung disease.  There probably is COPD as well.  He has some persistent dyspnea.  I have advised pulmonary function testing but he wants to get it drawn through the [facility] hospitals and follow-up there and he will schedule this himself.  Plan follow-up with me annually or sooner as necessary.  Chrystie Nose, MD, [facility], FACP  [place]  [facility]  HeartCare  Medical Director of the [facility]  Card[facility]ovascular [facility] Reduction [facility] Diplomate of the American Board of Clinical Lipidology Attending Cardiologist  Direct Dial: [phone]  Fax: [phone]  Website:  [email or web address] 03/05/2020, 9:34 AM

## 2020-03-05 NOTE — Patient Instructions (Signed)
Medication Instructions:  Your physician recommends that you continue on your current medications as directed. Please refer to the Current Medication list given to you today.  *If you need a refill on your cardiac medications before your next appointment, please call your pharmacy*  Follow-Up: At [facility], you and your health needs are our priority.  As part of our continuing mission to provide you with exceptional heart care, we have created designated Provider Care Teams.  These Care Teams include your primary Cardiologist (physician) and Advanced Practice Providers (APPs -  Physician Assistants and Nurse Practitioners) who all work together to provide you with the care you need, when you need it.  We recommend signing up for the patient portal called "MyChart".  Sign up information is provided on this After Visit Summary.  MyChart is used to connect with patients for Virtual Visits (Telemedicine).  Patients are able to view lab/test results, encounter notes, upcoming appointments, etc.  Non-urgent messages can be sent to your provider as well.   To learn more about what you can do with MyChart, go to [email or web address].    Your next appointment:   12 month(s)  The format for your next appointment:   In Person  Provider:   You may see Dr. Rennis Golden or one of the following Advanced Practice Providers on your designated Care Team:    Azalee Course, PA-C  Micah Flesher, [place] or   Judy Pimple, [place]    Other Instructions Please follow up with PCP about scheduling a PFT (pulmonary function test)/pulmonologist referral

## 2020-03-23 LAB — PULMONARY FUNCTION TEST

## 2020-04-02 ENCOUNTER — Ambulatory Visit: Payer: Medicare HMO | Admitting: Internal Medicine

## 2020-04-02 ENCOUNTER — Encounter: Payer: Self-pay | Admitting: Internal Medicine

## 2020-04-02 ENCOUNTER — Other Ambulatory Visit: Payer: Self-pay

## 2020-04-02 VITALS — BP 122/70 | HR 75 | Ht 69.0 in | Wt 192.6 lb

## 2020-04-02 DIAGNOSIS — Z950 Presence of cardiac pacemaker: Secondary | ICD-10-CM | POA: Diagnosis not present

## 2020-04-02 DIAGNOSIS — I442 Atrioventricular block, complete: Secondary | ICD-10-CM | POA: Diagnosis not present

## 2020-04-02 DIAGNOSIS — I10 Essential (primary) hypertension: Secondary | ICD-10-CM | POA: Diagnosis not present

## 2020-04-02 LAB — CUP PACEART INCLINIC DEVICE CHECK
Brady Statistic RA Percent Paced: 1 % — CL
Brady Statistic RV Percent Paced: 75 %
Date Time Interrogation Session: 20220228154531
Implantable Lead Implant Date: 20211122
Implantable Lead Implant Date: 20211122
Implantable Lead Location: 753859
Implantable Lead Location: 753860
Implantable Lead Model: 7841
Implantable Lead Model: 7842
Implantable Lead Serial Number: 1057413
Implantable Lead Serial Number: 1098402
Implantable Pulse Generator Implant Date: 20211122
Lead Channel Pacing Threshold Amplitude: 0.7 V
Lead Channel Pacing Threshold Amplitude: 0.7 V
Lead Channel Pacing Threshold Pulse Width: 0.4 ms
Lead Channel Pacing Threshold Pulse Width: 0.4 ms
Lead Channel Sensing Intrinsic Amplitude: 6.1 mV
Lead Channel Sensing Intrinsic Amplitude: 6.4 mV
Pulse Gen Serial Number: 962270

## 2020-04-02 NOTE — Patient Instructions (Signed)
Medication Instructions:  Your physician recommends that you continue on your current medications as directed. Please refer to the Current Medication list given to you today.  Labwork: None ordered.  Testing/Procedures: None ordered.  Follow-Up: Your physician wants you to follow-up in: one year with Gregg Taylor, MD or one of the following Advanced Practice Providers on your designated Care Team:    Amber Seiler, NP  Renee Ursuy, PA-C  Michael "Andy" Tillery, PA-C  Remote monitoring is used to monitor your Pacemaker from home. This monitoring reduces the number of office visits required to check your device to one time per year. It allows us to keep an eye on the functioning of your device to ensure it is working properly. You are scheduled for a device check from home on 06/26/2020. You may send your transmission at any time that day. If you have a wireless device, the transmission will be sent automatically. After your physician reviews your transmission, you will receive a postcard with your next transmission date.  Any Other Special Instructions Will Be Listed Below (If Applicable).  If you need a refill on your cardiac medications before your next appointment, please call your pharmacy.       

## 2020-04-02 NOTE — Progress Notes (Signed)
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 He has not actually passed out and he is not driving. He denies chest pain or edema. He underwent left heart cath and has non-obstructive CAD and preserved LV function.  Allergies  Allergen Reactions  . Doxycycline Shortness Of Breath and Rash  . Amoxicillin Rash  . Prednisone     High blood sugar  . Sulfa Antibiotics Rash    Headache/ Chest tightness/ stiff neck     Current Outpatient Medications  Medication Sig Dispense Refill  . albuterol (VENTOLIN HFA) 108 (90 Base) MCG/ACT inhaler Inhale into the lungs every 6 (six) hours as needed for wheezing or shortness of breath.    [facility] Kitchen aspirin 81 MG EC tablet Take 81 mg by mouth daily.     . Cholecalciferol (VITAMIN D3) 1.25 MG (50000 UT) TABS Take 5,000 Units by mouth daily.     . cyanocobalamin 1000 MCG tablet Take 1,000 mcg by mouth daily.    . empagliflozin (JARDIANCE) 10 MG TABS tablet Take 5 mg by mouth in the morning and at bedtime.    . ferrous sulfate 325 (65 FE) MG tablet Take 325 mg by mouth daily with breakfast.    . glipiZIDE (GLUCOTROL) 10 MG tablet Take 10 mg by mouth daily before breakfast.    . lisinopril (ZESTRIL) 5 MG tablet Take 2.5 mg by mouth daily.     . magnesium oxide (MAG-OX) 400 MG tablet Take 400 mg by mouth 2 (two) times daily.    . meclizine (ANTIVERT) 25 MG tablet Take 25 mg by mouth 3 (three) times daily as needed for dizziness.    . metFORMIN (GLUCOPHAGE) 1000 MG tablet Take 1 tablet (1,000 mg total) by mouth in the morning and at bedtime.    . Multiple Vitamin (MULTI-VITAMIN) tablet Take 1 tablet by mouth daily.    [facility] omeprazole (PRILOSEC) 20 MG capsule Take 20 mg by mouth See admin instructions. Every other night    . rosuvastatin (CRESTOR) 20 MG tablet Take 20 mg by mouth at bedtime.       No current facility-administered medications for this visit.     Past Medical History:  Diagnosis Date  . Diabetes mellitus without complication (HCC)   . High cholesterol     ROS:   All systems reviewed and negative except as noted in the HPI.   Past Surgical History:  Procedure Laterality Date  . BACK SURGERY    . LEFT HEART CATH AND CORONARY ANGIOGRAPHY N/A 11/22/2019   Procedure: LEFT HEART CATH AND CORONARY ANGIOGRAPHY;  Surgeon: Corky Crafts, MD;  Location: [facility] INVASIVE CV LAB;  Service: Cardiovascular;  Laterality: N/A;  . NECK SURGERY    . PACEMAKER IMPLANT N/A 12/26/2019   Procedure: PACEMAKER IMPLANT;  Surgeon: Marinus Maw, MD;  Location: [facility] INVASIVE CV LAB;  Service: Cardiovascular;  Laterality: N/A;     Family History  Problem Relation Age of Onset  . CAD Brother   . CAD Brother      Social History   Socioeconomic History  . Marital status: Married    Spouse name: Not on file  . Number of children: Not on file  . Years of education: Not on file  . Highest education level: Not on file  Occupational History  . Not on  file  Tobacco Use  . Smoking status: Never Smoker  . Smokeless tobacco: Never Used  Substance and Sexual Activity  . Alcohol use: Not Currently  . Drug use: Yes    Types: Marijuana  . Sexual activity: Not on file  Other Topics Concern  . Not on file  Social History Narrative  . Not on file   Social Determinants of Health   Financial Resource Strain: Not on file  Food Insecurity: Not on file  Transportation Needs: Not on file  Physical Activity: Not on file  Stress: Not on file  Social Connections: Not on file  Intimate Partner Violence: Not on file     BP 122/70   Pulse 75   Ht 5\' 9"  (1.753 m)   Wt 192 lb 9.6 oz (87.4 kg)   SpO2 97%   BMI 28.44 kg/m   Physical Exam:  Well appearing NAD HEENT: Unremarkable Neck:  No JVD, no thyromegally Lymphatics:  No adenopathy Back:  No CVA tenderness Lungs:   Clear with no wheezes and well healed PPM incision HEART:  Regular rate rhythm, no murmurs, no rubs, no clicks Abd:  soft, positive bowel sounds, no organomegally, no rebound, no guarding Ext:  2 plus pulses, no edema, no cyanosis, no clubbing Skin:  No rashes no nodules Neuro:  CN II through XII intact, motor grossly intact  EKG - NSR with ventricular pacing  DEVICE  Normal device function.  See PaceArt for details.   Assess/Plan: 1. 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  2. 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 We will recheck in several months. 3. Chest pressure - resolved, after PPM 4. HTN - his bp is well controlled. No change.  [place]ie Taylor,MD

## 2020-04-10 ENCOUNTER — Ambulatory Visit (INDEPENDENT_AMBULATORY_CARE_PROVIDER_SITE_OTHER): Payer: Medicare HMO

## 2020-04-10 DIAGNOSIS — I442 Atrioventricular block, complete: Secondary | ICD-10-CM | POA: Diagnosis not present

## 2020-04-10 LAB — CUP PACEART REMOTE DEVICE CHECK
Battery Remaining Longevity: 138 mo
Battery Remaining Percentage: 100 %
Brady Statistic RA Percent Paced: 0 %
Brady Statistic RV Percent Paced: 100 %
Date Time Interrogation Session: 20220307151500
Implantable Lead Implant Date: 20211122
Implantable Lead Implant Date: 20211122
Implantable Lead Location: 753859
Implantable Lead Location: 753860
Implantable Lead Model: 7841
Implantable Lead Model: 7842
Implantable Lead Serial Number: 1057413
Implantable Lead Serial Number: 1098402
Implantable Pulse Generator Implant Date: 20211122
Lead Channel Impedance Value: 634 Ohm
Lead Channel Impedance Value: 684 Ohm
Lead Channel Pacing Threshold Amplitude: 0.5 V
Lead Channel Pacing Threshold Amplitude: 0.7 V
Lead Channel Pacing Threshold Pulse Width: 0.4 ms
Lead Channel Pacing Threshold Pulse Width: 0.4 ms
Lead Channel Setting Pacing Amplitude: 2 V
Lead Channel Setting Pacing Amplitude: 2.5 V
Lead Channel Setting Pacing Pulse Width: 0.4 ms
Lead Channel Setting Sensing Sensitivity: 2.5 mV
Pulse Gen Serial Number: 962270

## 2020-04-18 NOTE — Progress Notes (Signed)
Remote pacemaker transmission.   

## 2020-04-24 ENCOUNTER — Telehealth: Payer: Self-pay | Admitting: Internal Medicine

## 2020-04-24 NOTE — Telephone Encounter (Signed)
Spoke with patient and wife, still having chest tightness and shortness of breath with exertion  Per patient everything lung wise checked out ok, would like to discuss with Dr Rennis Golden next steps  Scheduled follow up Friday 3/25 Aware of date and time of appointment

## 2020-04-24 NOTE — Telephone Encounter (Signed)
Pt wife called and stated pt had the PFT test done at the [place] and the results came back ok.  But pt is still having the tightness in his chest    Pt c/o of Chest Pain: STAT if CP now or developed within 24 hours  1. Are you having CP right now?  No right now , only when he is up and moving around he starts feeling tightness in his chest.  Pt stated no really chest pain but tightness   2. Are you experiencing any other symptoms (ex. SOB, nausea, vomiting, sweating)? Sob is only when he is up and moving around.  Pt Denies any of the other symptoms listed   3. How long have you been experiencing CP? Its be going on a month or more   4. Is your CP continuous or coming and going? Tightness is continuous when he has the episodes   5. Have you taken Nitroglycerin? Na   Best number [phone] ?

## 2020-04-27 ENCOUNTER — Ambulatory Visit (INDEPENDENT_AMBULATORY_CARE_PROVIDER_SITE_OTHER): Payer: Medicare HMO | Admitting: Internal Medicine

## 2020-04-27 ENCOUNTER — Other Ambulatory Visit: Payer: Self-pay

## 2020-04-27 VITALS — BP 114/52 | HR 69 | Ht 69.0 in | Wt 193.0 lb

## 2020-04-27 DIAGNOSIS — R0602 Shortness of breath: Secondary | ICD-10-CM | POA: Diagnosis not present

## 2020-04-27 DIAGNOSIS — Z79899 Other long term (current) drug therapy: Secondary | ICD-10-CM | POA: Diagnosis not present

## 2020-04-27 DIAGNOSIS — Z950 Presence of cardiac pacemaker: Secondary | ICD-10-CM | POA: Diagnosis not present

## 2020-04-27 DIAGNOSIS — J810 Acute pulmonary edema: Secondary | ICD-10-CM

## 2020-04-27 MED ORDER — FUROSEMIDE 40 MG PO TABS: 40.0000 mg | ORAL_TABLET | Freq: Every day | ORAL | 3 refills | Status: DC

## 2020-04-27 NOTE — Patient Instructions (Signed)
Medication Instructions:  START lasix 40mg  daily  *If you need a refill on your cardiac medications before your next appointment, please call your pharmacy*   Lab Work: BMET, BNP today   If you have labs (blood work) drawn today and your tests are completely normal, you will receive your results only by: MyChart Message (if you have MyChart) OR . A paper copy in the mail If you have any lab test that is abnormal or we need to change your treatment, we will call you to review the results.   Testing/Procedures: Your physician has requested that you have an echocardiogram. Echocardiography is a painless test that uses sound waves to create images of your heart. It provides your doctor with information about the size and shape of your heart and how well your heart's chambers and valves are working. This procedure takes approximately one hour. There are no restrictions for this procedure. -- [address]  Follow-Up: At [facility], you and your health needs are our priority.  As part of our continuing mission to provide you with exceptional heart care, we have created designated Provider Care Teams.  These Care Teams include your primary Cardiologist (physician) and Advanced Practice Providers (APPs -  Physician Assistants and Nurse Practitioners) who all work together to provide you with the care you need, when you need it.  We recommend signing up for the patient portal called "MyChart".  Sign up information is provided on this After Visit Summary.  MyChart is used to connect with patients for Virtual Visits (Telemedicine).  Patients are able to view lab/test results, encounter notes, upcoming appointments, etc.  Non-urgent messages can be sent to your provider as well.   To learn more about what you can do with MyChart, go to [employer].    Your next appointment:   4-6 week(s)  The format for your next appointment:   In Person  Provider:   You may  see [email or web address], MD or one of the following Advanced Practice Providers on your designated Care Team:    Chrystie Nose, PA-C  Azalee Course, PA-C or   Micah Flesher, Judy Pimple    Other Instructions

## 2020-04-27 NOTE — Progress Notes (Signed)
OFFICE NOTE  Chief Complaint:  Follow-up  Primary Care Physician: [facility]  HPI:  Scott Price is a 75 y.o. male with a past medial history significant for type 2 diabetes and dyslipidemia with a family history of heart disease in multiple brothers.  Scott Price was seen over the weekend in the [facility], ER with progressive chest discomfort shortness of breath and dizziness.  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  He says during these episodes he gets chest tightness, fatigue and dizziness and has to sit down with some relief but his symptoms have been worsening.  He was seen in the ER again and ruled out for MI.  During his monitoring, he was noted to have predominantly sinus rhythm although he did have one short strip of heart block.  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  Today in follow-up his EKG demonstrates normal sinus rhythm at 76 without ischemic changes.  12/13/2019  Scott Price returns today for follow-up.  When I last saw him I was highly concerned about unstable angina.  He was scheduled the next day for cardiac catheterization.  Cardiac cath on November 22, 2019 showed only mild nonobstructive coronary disease with about 25% stenoses in all 3 coronary vessels.  Normal LVEF of 55 to 65% with normal LVEDP and no evidence of aortic valve stenosis.  Overall very reassuring however since then he has continued to have lifestyle limiting symptoms of chest pain, fatigue, dizziness and shortness of breath.  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  He does not have a primary care provider but did establish with a PCP again at the [facility] hospital but does not have an appointment until December.  He reportedly has a significant smoking history but quit a  number of years ago.  I would estimate between 50 to 75 pack years.  This is concerning for possible COPD as a cause of his symptoms.  Other possible causes could be aneurysm or perhaps pulmonary embolus.  I think that is less likely since his symptoms have been going on for some time but should be considered.  03/05/2020  Scott Price returns today for follow-up.  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  Prior to that he had a heart catheterization in October 2021 which showed nonobstructive multivessel coronary disease.  EF was normal.  He does have coronary calcification.  Since he had his pacemaker placed he is done very well.  A dual-chamber [employer] device.  He did not receive a remote monitor but is scheduled for remote pacer check in February.  He also has follow-up with Dr. Ladona Ridgel on February 28.  His CT scan did show honeycombing and some signs of interstitial lung disease in November.  I advised him to get pulmonary function testing and was again referred him to pulmonary however he would like to do this testing and follow-up through the [facility] health system.  Currently seeing a provider there to schedule this.  04/27/2020  Scott Price is seen today for follow-up.  He has continued to have worsening shortness of breath.  He says he has to sleep upright in a recliner.  Has  had a little bit of swelling in his right ankle and continues to have some persistent crackles in the left lung base.  He underwent pulmonary function testing at the [place] interpreted by Dr. Marcelyn Bruins.  This demonstrated no evidence of obstruction, restriction or diffusion defect.  Based on that I am more concerned that this could be some heart failure.  Previously his CT angio did show honeycombing and suggestion of interstitial lung disease however possibly this was fluid.  His BNP however had been low at the time.  PMHx:  Past Medical History:   Diagnosis Date  . Diabetes mellitus without complication (HCC)   . High cholesterol     Past Surgical History:  Procedure Laterality Date  . BACK SURGERY    . LEFT HEART CATH AND CORONARY ANGIOGRAPHY N/A 11/22/2019   Procedure: LEFT HEART CATH AND CORONARY ANGIOGRAPHY;  Surgeon: Corky Crafts, MD;  Location: [facility] INVASIVE CV LAB;  Service: Cardiovascular;  Laterality: N/A;  . NECK SURGERY    . PACEMAKER IMPLANT N/A 12/26/2019   Procedure: PACEMAKER IMPLANT;  Surgeon: Marinus Maw, MD;  Location: [facility] INVASIVE CV LAB;  Service: Cardiovascular;  Laterality: N/A;    FAMHx:  Family History  Problem Relation Age of Onset  . CAD Brother   . CAD Brother     SOCHx:   reports that he has never smoked. He has never used smokeless tobacco. He reports previous alcohol use. He reports current drug use. Drug: Marijuana.  ALLERGIES:  Allergies  Allergen Reactions  . Doxycycline Shortness Of Breath and Rash  . Amoxicillin Rash  . Prednisone     High blood sugar  . Sulfa Antibiotics Rash    Headache/ Chest tightness/ stiff neck    ROS: Pertinent items noted in HPI and remainder of comprehensive ROS otherwise negative.  HOME MEDS: Current Outpatient Medications on File Prior to Visit  Medication Sig Dispense Refill  . albuterol (VENTOLIN HFA) 108 (90 Base) MCG/ACT inhaler Inhale into the lungs every 6 (six) hours as needed for wheezing or shortness of breath.    [facility] aspirin 81 MG EC tablet Take 81 mg by mouth daily.     . Cholecalciferol (VITAMIN D3) 1.25 MG (50000 UT) TABS Take 5,000 Units by mouth daily.     . cyanocobalamin 1000 MCG tablet Take 1,000 mcg by mouth daily.    . empagliflozin (JARDIANCE) 10 MG TABS tablet Take 5 mg by mouth in the morning and at bedtime.    . ferrous sulfate 325 (65 FE) MG tablet Take 325 mg by mouth daily with breakfast.    . glipiZIDE (GLUCOTROL) 10 MG tablet Take 10 mg by mouth daily before breakfast.    . lisinopril (ZESTRIL) 5 MG tablet Take  2.5 mg by mouth daily.     . magnesium oxide (MAG-OX) 400 MG tablet Take 400 mg by mouth 2 (two) times daily.    . meclizine (ANTIVERT) 25 MG tablet Take 25 mg by mouth 3 (three) times daily as needed for dizziness.    . metFORMIN (GLUCOPHAGE) 1000 MG tablet Take 1 tablet (1,000 mg total) by mouth in the morning and at bedtime.    . Multiple Vitamin (MULTI-VITAMIN) tablet Take 1 tablet by mouth daily.    [facility] omeprazole (PRILOSEC) 20 MG capsule Take 20 mg by mouth See admin instructions. Every other night    . rosuvastatin (CRESTOR) 20 MG tablet Take 20 mg by mouth at bedtime.      No current facility-administered  medications on file prior to visit.    LABS/IMAGING: No results found for this or any previous visit (from the past 48 hour(s)). No results found.  LIPID PANEL: No results found for: CHOL, TRIG, HDL, CHOLHDL, VLDL, LDLCALC, LDLDIRECT   WEIGHTS: Wt Readings from Last 3 Encounters:  04/27/20 193 lb (87.5 kg)  04/02/20 192 lb 9.6 oz (87.4 kg)  03/05/20 199 lb 3.2 oz (90.4 kg)    VITALS: BP (!) 114/52 (BP Location: Left Arm, Patient Position: Sitting, Cuff Size: Normal)   Pulse 69   Ht 5\' 9"  (1.753 m)   Wt 193 lb (87.5 kg)   BMI 28.50 kg/m   EXAM: General appearance: alert and no distress Neck: no carotid bruit, no JVD and thyroid not enlarged, symmetric, no tenderness/mass/nodules Lungs: rales bibasilar Heart: regular rate and rhythm, S1, S2 normal, no murmur, click, rub or gallop Abdomen: soft, non-tender; bowel sounds normal; no masses,  no organomegaly Extremities: extremities normal, atraumatic, no cyanosis or edema Pulses: 2+ and symmetric Skin: Skin color, texture, turgor normal. No rashes or lesions Neurologic: Grossly normal Psych: Pleasant  EKG: AV paced rhythm at 69-personally reviewed  ASSESSMENT: 1. 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 Imaging evidence of possible interstitial lung disease by CT (12/2019) 4. Hypertension 5. Type 2 diabetes 6. Family history of premature coronary disease in multiple brothers 7. 67-75 pack/yr smoking history, quit  PLAN: 1.   Mr. Dobosz continues to have a mild nonproductive cough, shortness of breath with exertion and a degree of orthopnea having to sleep in an upright recliner.  His BNP was negative in November and his CT showed honeycombing suggestive of interstitial lung disease however his pulmonary function testing at the December was completely normal with normal diffusion.  He still has some crackles in his lung base and I suspect this could be some heart failure.  He did not have an echo amongst all the other test that he had performed.  We will go ahead and obtain that as well as repeat labs today including a BMET and BNP.  Start Lasix 40 mg daily today -echo the next week to see if he has had some improvement in his symptoms.  Plan follow-up with me after the echo.  [place], MD, [facility], FACP  [place]  [facility]  Medical Director of the [facility]  [facility] Diplomate of the American Board of Clinical Lipidology Attending Cardiologist  Direct Dial: [phone]  Fax: [phone]  Website:  [email or web address] [place] 04/27/2020, 3:29 PM

## 2020-04-28 LAB — BASIC METABOLIC PANEL
BUN/Creatinine Ratio: 16 (ref 10–24)
BUN: 20 mg/dL (ref 8–27)
CO2: 21 mmol/L (ref 20–29)
Calcium: 10.1 mg/dL (ref 8.6–10.2)
Chloride: 101 mmol/L (ref 96–106)
Creatinine, Ser: 1.23 mg/dL (ref 0.76–1.27)
Glucose: 157 mg/dL — ABNORMAL HIGH (ref 65–99)
Potassium: 4.3 mmol/L (ref 3.5–5.2)
Sodium: 142 mmol/L (ref 134–144)
eGFR: 61 mL/min/{1.73_m2} (ref >59)

## 2020-04-28 LAB — BRAIN NATRIURETIC PEPTIDE: BNP: 34.2 pg/mL (ref 0.0–100.0)

## 2020-05-25 ENCOUNTER — Ambulatory Visit (HOSPITAL_COMMUNITY): Payer: Medicare HMO | Attending: Internal Medicine

## 2020-05-25 ENCOUNTER — Other Ambulatory Visit: Payer: Self-pay

## 2020-05-25 DIAGNOSIS — R0602 Shortness of breath: Secondary | ICD-10-CM | POA: Diagnosis present

## 2020-05-25 LAB — ECHOCARDIOGRAM COMPLETE
Area-P 1/2: 3.12 cm2
S' Lateral: 3.2 cm

## 2020-05-25 MED ORDER — PERFLUTREN LIPID MICROSPHERE
1.0000 mL | INTRAVENOUS | Status: AC | PRN
  Administered 2020-05-25: 1 mL via INTRAVENOUS

## 2020-06-11 ENCOUNTER — Encounter: Payer: Self-pay | Admitting: Internal Medicine

## 2020-06-11 ENCOUNTER — Ambulatory Visit: Payer: Medicare HMO | Admitting: Internal Medicine

## 2020-06-11 ENCOUNTER — Other Ambulatory Visit: Payer: Self-pay

## 2020-06-11 VITALS — BP 116/56 | HR 75 | Ht 69.0 in | Wt 189.2 lb

## 2020-06-11 DIAGNOSIS — I2510 Atherosclerotic heart disease of native coronary artery without angina pectoris: Secondary | ICD-10-CM

## 2020-06-11 DIAGNOSIS — R079 Chest pain, unspecified: Secondary | ICD-10-CM | POA: Diagnosis not present

## 2020-06-11 DIAGNOSIS — I10 Essential (primary) hypertension: Secondary | ICD-10-CM

## 2020-06-11 DIAGNOSIS — E782 Mixed hyperlipidemia: Secondary | ICD-10-CM

## 2020-06-11 MED ORDER — ISOSORBIDE MONONITRATE ER 30 MG PO TB24: 15.0000 mg | ORAL_TABLET | Freq: Every day | ORAL | 3 refills | Status: DC

## 2020-06-11 NOTE — Patient Instructions (Signed)
Medication Instructions:  START isosorbide mononitrate 15mg  daily (half of a 30mg  tablet)   *If you need a refill on your cardiac medications before your next appointment, please call your pharmacy*   Lab Work: NONE If you have labs (blood work) drawn today and your tests are completely normal, you will receive your results only by: MyChart Message (if you have MyChart) OR . A paper copy in the mail If you have any lab test that is abnormal or we need to change your treatment, we will call you to review the results.   Testing/Procedures: NONE   Follow-Up: At [facility], you and your health needs are our priority.  As part of our continuing mission to provide you with exceptional heart care, we have created designated Provider Care Teams.  These Care Teams include your primary Cardiologist (physician) and Advanced Practice Providers (APPs -  Physician Assistants and Nurse Practitioners) who all work together to provide you with the care you need, when you need it.  We recommend signing up for the patient portal called "MyChart".  Sign up information is provided on this After Visit Summary.  MyChart is used to connect with patients for Virtual Visits (Telemedicine).  Patients are able to view lab/test results, encounter notes, upcoming appointments, etc.  Non-urgent messages can be sent to your provider as well.   To learn more about what you can do with MyChart, go to [facility].    Your next appointment:   3-4 month(s)  The format for your next appointment:   In Person  Provider:   You may see [facility], MD or one of the following Advanced Practice Providers on your designated Care Team:    [email or web address], PA-C  Chrystie Nose, PA-C or   Azalee Course, Micah Flesher    Other Instructions

## 2020-06-11 NOTE — Progress Notes (Signed)
OFFICE NOTE  Chief Complaint:  Follow-up  Primary Care Physician: [facility], [facility]  HPI:  Scott Price is a 75 y.o. male with a past medial history significant for type 2 diabetes and dyslipidemia with a family history of heart disease in multiple brothers.  Scott Price was seen over the weekend in the [facility], ER with progressive chest discomfort shortness of breath and dizziness.  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  He says during these episodes he gets chest tightness, fatigue and dizziness and has to sit down with some relief but his symptoms have been worsening.  He was seen in the ER again and ruled out for MI.  During his monitoring, he was noted to have predominantly sinus rhythm although he did have one short strip of heart block.  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  Today in follow-up his EKG demonstrates normal sinus rhythm at 75 without ischemic changes.  12/13/2019  Scott Price returns today for follow-up.  When I last saw him I was highly concerned about unstable angina.  He was scheduled the next day for cardiac catheterization.  Cardiac cath on November 22, 2019 showed only mild nonobstructive coronary disease with about 25% stenoses in all 3 coronary vessels.  Normal LVEF of 55 to 65% with normal LVEDP and no evidence of aortic valve stenosis.  Overall very reassuring however since then he has continued to have lifestyle limiting symptoms of chest pain, fatigue, dizziness and shortness of breath.  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  He does not have a primary care provider but did establish with a PCP again at the [facility] but does not have an appointment until December.  He reportedly has a significant smoking history but quit a  number of years ago.  I would estimate between 50 to 75 pack years.  This is concerning for possible COPD as a cause of his symptoms.  Other possible causes could be aneurysm or perhaps pulmonary embolus.  I think that is less likely since his symptoms have been going on for some time but should be considered.  03/05/2020  Scott Price returns today for follow-up.  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  Prior to that he had a heart catheterization in October 2021 which showed nonobstructive multivessel coronary disease.  EF was normal.  He does have coronary calcification.  Since he had his pacemaker placed he is done very well.  A dual-chamber [employer] device.  He did not receive a remote monitor but is scheduled for remote pacer check in February.  He also has follow-up with Dr. Ladona Ridgel on February 28.  His CT scan did show honeycombing and some signs of interstitial lung disease in November.  I advised him to get pulmonary function testing and was again referred him to pulmonary however he would like to do this testing and follow-up through the [facility] health system.  Currently seeing a provider there to schedule this.  04/27/2020  Scott Price is seen today for follow-up.  He has continued to have worsening shortness of breath.  He says he has to sleep upright in a recliner.  Has  had a little bit of swelling in his right ankle and continues to have some persistent crackles in the left lung base.  He underwent pulmonary function testing at the [place] interpreted by Dr. Marcelyn Bruins.  This demonstrated no evidence of obstruction, restriction or diffusion defect.  Based on that I am more concerned that this could be some heart failure.  Previously his CT angio did show honeycombing and suggestion of interstitial lung disease however possibly this was fluid.  His BNP however had been low at the time.  10/2020  Scott Price is seen today in  follow-up.  After starting him on some Lasix he reports no significant improvement in his shortness of breath however did have some improvement in his swelling.  BNP was low at retest, negative for heart failure.  He also had an unfortunate rash on Lasix.  Apparently has a history of sulfa allergy and it may have been the sulfa moiety in the drug that caused him the reaction.  Repeat echo showed normal systolic function with mild diastolic dysfunction but did not really suggest heart failure.  He still reports shortness of breath and that he does get situational chest tightness.  He says it feels like a squeezing in the mid central chest which is worse with exertion and relieved by rest.  These findings more suggestive of angina although his catheterization showed only mild nonobstructive coronary disease last year.  He is a diabetic and therefore possibly could have some small vessel disease.  PMHx:  Past Medical History:  Diagnosis Date  . Diabetes mellitus without complication (HCC)   . High cholesterol     Past Surgical History:  Procedure Laterality Date  . BACK SURGERY    . LEFT HEART CATH AND CORONARY ANGIOGRAPHY N/A 11/22/2019   Procedure: LEFT HEART CATH AND CORONARY ANGIOGRAPHY;  Surgeon: Corky Crafts, MD;  Location: [facility] INVASIVE CV LAB;  Service: Cardiovascular;  Laterality: N/A;  . NECK SURGERY    . PACEMAKER IMPLANT N/A 12/26/2019   Procedure: PACEMAKER IMPLANT;  Surgeon: Marinus Maw, MD;  Location: [facility] INVASIVE CV LAB;  Service: Cardiovascular;  Laterality: N/A;    FAMHx:  Family History  Problem Relation Age of Onset  . CAD Brother   . CAD Brother     SOCHx:   reports that he has never smoked. He has never used smokeless tobacco. He reports previous alcohol use. He reports current drug use. Drug: Marijuana.  ALLERGIES:  Allergies  Allergen Reactions  . Doxycycline Shortness Of Breath and Rash  . Amoxicillin Rash  . Prednisone     High blood sugar  . Sulfa  Antibiotics Rash    Headache/ Chest tightness/ stiff neck    ROS: Pertinent items noted in HPI and remainder of comprehensive ROS otherwise negative.  HOME MEDS: Current Outpatient Medications on File Prior to Visit  Medication Sig Dispense Refill  . aspirin 81 MG EC tablet Take 81 mg by mouth daily.     . Cholecalciferol (VITAMIN D3) 1.25 MG (50000 UT) TABS Take 5,000 Units by mouth daily.     . cyanocobalamin 1000 MCG tablet Take 1,000 mcg by mouth daily.    . empagliflozin (JARDIANCE) 10 MG TABS tablet Take 5 mg by mouth in the morning and at bedtime.    . ferrous sulfate 325 (65 FE) MG tablet Take 325 mg by mouth daily with breakfast.    . glipiZIDE (GLUCOTROL) 10 MG tablet Take 10 mg by mouth daily before breakfast.    .  lisinopril (ZESTRIL) 5 MG tablet Take 2.5 mg by mouth daily.     . magnesium oxide (MAG-OX) 400 MG tablet Take 400 mg by mouth 2 (two) times daily.    . meclizine (ANTIVERT) 25 MG tablet Take 25 mg by mouth 3 (three) times daily as needed for dizziness.    . metFORMIN (GLUCOPHAGE) 1000 MG tablet Take 1 tablet (1,000 mg total) by mouth in the morning and at bedtime.    . Multiple Vitamin (MULTI-VITAMIN) tablet Take 1 tablet by mouth daily.    Marland Kitchen omeprazole (PRILOSEC) 20 MG capsule Take 20 mg by mouth See admin instructions. Every other night    . rosuvastatin (CRESTOR) 20 MG tablet Take 20 mg by mouth at bedtime.      No current facility-administered medications on file prior to visit.    LABS/IMAGING: No results found for this or any previous visit (from the past 48 hour(s)). No results found.  LIPID PANEL: No results found for: CHOL, TRIG, HDL, CHOLHDL, VLDL, LDLCALC, LDLDIRECT   WEIGHTS: Wt Readings from Last 3 Encounters:  06/11/20 189 lb 3.2 oz (85.8 kg)  04/27/20 193 lb (87.5 kg)  04/02/20 192 lb 9.6 oz (87.4 kg)    VITALS: BP (!) 116/56   Pulse 75   Ht 5\' 9"  (1.753 m)   Wt 189 lb 3.2 oz (85.8 kg)   SpO2 99%   BMI 27.94 kg/m   EXAM: General  appearance: alert and no distress Neck: no carotid bruit, no JVD and thyroid not enlarged, symmetric, no tenderness/mass/nodules Lungs: rales bibasilar Heart: regular rate and rhythm, S1, S2 normal, no murmur, click, rub or gallop Abdomen: soft, non-tender; bowel sounds normal; no masses,  no organomegaly Extremities: extremities normal, atraumatic, no cyanosis or edema Pulses: 2+ and symmetric Skin: Skin color, texture, turgor normal. No rashes or lesions Neurologic: Grossly normal Psych: Pleasant  EKG: Deferred  ASSESSMENT: 1. 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 Imaging evidence of possible interstitial lung disease by CT (12/2019) 4. Hypertension 5. Type 2 diabetes 6. Family history of premature coronary disease in multiple brothers 7. 88-75 pack/yr smoking history, quit  PLAN: 1.   Scott Price continues to describe episodes of chest tightness.  He had no significant improvement in his lung crackles or shortness of breath although did have some improvement in lower extremity edema.  He unfortunately had rash with Lasix likely due to sulfa as he has a pre-existing sulfa allergy.  This is complicated as he most likely could not use any other loop diuretics except ethacrynic acid in the future.  This is very difficult to get.  He may actually be describing angina.  I am not very certain although I think his risk is pretty low at this point.  He could have small vessel disease based on the fact that there was nonobstructive coronary disease by cath last year.  Would recommend starting low-dose Imdur 15 mg daily.  Blood pressure is soft anyhow and he is only on low-dose lisinopril.  We will plan follow-up in 3 months to see if he is responded to this.  Leavy Cella, MD, [facility], FACP  [place]  [facility]  Medical Director of the [facility]   Cardiovascular Risk Reduction Clinic Diplomate of the American Board of Clinical Lipidology Attending Cardiologist  Direct Dial: [phone]  Fax: [phone]  Website:  [email or web address]   [phone]  Elyana Grabski 06/11/2020, 2:49 PM

## 2020-09-26 ENCOUNTER — Other Ambulatory Visit: Payer: Self-pay

## 2020-09-26 ENCOUNTER — Ambulatory Visit (INDEPENDENT_AMBULATORY_CARE_PROVIDER_SITE_OTHER): Payer: Medicare HMO | Admitting: Pulmonary Disease

## 2020-09-26 ENCOUNTER — Encounter: Payer: Self-pay | Admitting: Pulmonary Disease

## 2020-09-26 VITALS — BP 124/68 | HR 72 | Temp 97.7°F | Ht 69.0 in | Wt 182.4 lb

## 2020-09-26 DIAGNOSIS — R059 Cough, unspecified: Secondary | ICD-10-CM

## 2020-09-26 DIAGNOSIS — J849 Interstitial pulmonary disease, unspecified: Secondary | ICD-10-CM

## 2020-09-26 DIAGNOSIS — R0602 Shortness of breath: Secondary | ICD-10-CM | POA: Diagnosis not present

## 2020-09-26 MED ORDER — FLUTICASONE FUROATE-VILANTEROL 200-25 MCG/INH IN AEPB: 1.0000 | INHALATION_SPRAY | Freq: Every day | RESPIRATORY_TRACT | 0 refills | Status: DC

## 2020-09-26 MED ORDER — ALBUTEROL SULFATE HFA 108 (90 BASE) MCG/ACT IN AERS: 2.0000 | INHALATION_SPRAY | Freq: Four times a day (QID) | RESPIRATORY_TRACT | 5 refills | Status: DC | PRN

## 2020-09-26 NOTE — Patient Instructions (Addendum)
Thank you for visiting Dr. Tonia Brooms at [facility]. Today we recommend the following:  Meds ordered this encounter  Medications   fluticasone furoate-vilanterol (BREO ELLIPTA) 200-25 MCG/INH AEPB    Sig: Inhale 1 puff into the lungs daily.    Dispense:  14 each    Refill:  0    Order Specific Question:   Lot Number?    Answer:   [id number]    Order Specific Question:   Expiration Date?    Answer:   11/03/2020    Order Specific Question:   Manufacturer?    Answer:   [employer] [12]    Order Specific Question:   Quantity    Answer:   1   Continue albuterol as needed and before activity   Return in about 8 weeks (around 11/21/2020).    Please do your part to reduce the spread of COVID-19.

## 2020-09-26 NOTE — Progress Notes (Signed)
Synopsis: Referred in august 2022 for SOB by [facility]  Subjective:   [name] GENDER: male DOB: Jan 29, 1946, MRN: [id number]  Chief Complaint  Patient presents with   Consult    Pt is being referred due to chronic cough.  Pt states that he does have complaints of chest tightness and SOB with activities.    PMH DMII, high cholesterol. He has recurrent issues with chest tightness. Dry cough, usually non-productive. Occasionally in the morning her has productive cough. He has gerd and takes a PPI. If he does any activity it makes the episodes worse. Hot shower and activity makes it worse. No seasonal changes that he notices. He does have sinus issues at times. Retired at a Programme researcher, broadcasting/film/video. Was in [employer], [place] - 1971. No pets in the house. PFTS at [facility] ratio normal, FEV1 2L, normal dlco, normal tlc. He stays active and still working part time. He has used albuterol in the past prior to getting his pacemaker.   CT scan of the chest was completed in November 2021 which revealed lower lobe honeycombing and early evidence of interstitial lung disease.   Past Medical History:  Diagnosis Date   Diabetes mellitus without complication (HCC)    High cholesterol      Family History  Problem Relation Age of Onset   CAD Brother    CAD Brother      Past Surgical History:  Procedure Laterality Date   BACK SURGERY     LEFT HEART CATH AND CORONARY ANGIOGRAPHY N/A 11/22/2019   Procedure: LEFT HEART CATH AND CORONARY ANGIOGRAPHY;  Surgeon: Corky Crafts, MD;  Location: [facility] INVASIVE CV LAB;  Service: Cardiovascular;  Laterality: N/A;   NECK SURGERY     PACEMAKER IMPLANT N/A 12/26/2019   Procedure: PACEMAKER IMPLANT;  Surgeon: Marinus Maw, MD;  Location: [facility] INVASIVE CV LAB;  Service: Cardiovascular;  Laterality: N/A;    Social History   Socioeconomic History   Marital status: Married    Spouse name: Not on file   Number of children: Not on  file   Years of education: Not on file   Highest education level: Not on file  Occupational History   Not on file  Tobacco Use   Smoking status: Never   Smokeless tobacco: Never  Substance and Sexual Activity   Alcohol use: Not Currently   Drug use: Yes    Types: Marijuana   Sexual activity: Not on file  Other Topics Concern   Not on file  Social History Narrative   Not on file   Social Determinants of Health   Financial Resource Strain: Not on file  Food Insecurity: Not on file  Transportation Needs: Not on file  Physical Activity: Not on file  Stress: Not on file  Social Connections: Not on file  Intimate Partner Violence: Not on file     Allergies  Allergen Reactions   Doxycycline Shortness Of Breath and Rash   Amoxicillin Rash   Lasix [Furosemide] Rash   Prednisone     High blood sugar   Sulfa Antibiotics Rash    Headache/ Chest tightness/ stiff neck     Outpatient Medications Prior to Visit  Medication Sig Dispense Refill   aspirin 81 MG EC tablet Take 81 mg by mouth daily.      Cholecalciferol (VITAMIN D3) 25 MCG (1000 UT) CAPS Take 1,000 Units by mouth daily.     cyanocobalamin 1000 MCG tablet Take 1,000 mcg  by mouth daily.     empagliflozin (JARDIANCE) 10 MG TABS tablet Take 5 mg by mouth in the morning and at bedtime.     ferrous sulfate 325 (65 FE) MG tablet Take 325 mg by mouth daily with breakfast.     glipiZIDE (GLUCOTROL) 10 MG tablet Take 5 mg by mouth 2 (two) times daily before a meal.     magnesium oxide (MAG-OX) 400 MG tablet Take 400 mg by mouth 2 (two) times daily.     meclizine (ANTIVERT) 25 MG tablet Take 25 mg by mouth 3 (three) times daily as needed for dizziness.     metFORMIN (GLUCOPHAGE) 1000 MG tablet Take 1 tablet (1,000 mg total) by mouth in the morning and at bedtime.     Multiple Vitamin (MULTI-VITAMIN) tablet Take 1 tablet by mouth daily.     omeprazole (PRILOSEC) 20 MG capsule Take 20 mg by mouth See admin instructions. Every other  night     rosuvastatin (CRESTOR) 40 MG tablet Take 40 mg by mouth at bedtime.     albuterol (VENTOLIN HFA) 108 (90 Base) MCG/ACT inhaler Inhale 2 puffs into the lungs every 6 (six) hours as needed for wheezing or shortness of breath.     isosorbide mononitrate (IMDUR) 30 MG 24 hr tablet Take 0.5 tablets (15 mg total) by mouth daily. 45 tablet 3   lisinopril (ZESTRIL) 5 MG tablet Take 2.5 mg by mouth daily.      No facility-administered medications prior to visit.    Review of Systems  Constitutional:  Negative for chills, fever, malaise/fatigue and weight loss.  HENT:  Negative for hearing loss, sore throat and tinnitus.   Eyes:  Negative for blurred vision and double vision.  Respiratory:  Positive for cough and shortness of breath. Negative for hemoptysis, sputum production, wheezing and stridor.   Cardiovascular:  Negative for chest pain, palpitations, orthopnea, leg swelling and PND.  Gastrointestinal:  Negative for abdominal pain, constipation, diarrhea, heartburn, nausea and vomiting.  Genitourinary:  Negative for dysuria, hematuria and urgency.  Musculoskeletal:  Negative for joint pain and myalgias.  Skin:  Negative for itching and rash.  Neurological:  Negative for dizziness, tingling, weakness and headaches.  Endo/Heme/Allergies:  Negative for environmental allergies. Does not bruise/bleed easily.  Psychiatric/Behavioral:  Negative for depression. The patient is not nervous/anxious and does not have insomnia.   All other systems reviewed and are negative.   Objective:  Physical Exam Vitals reviewed.  Constitutional:      General: He is not in acute distress.    Appearance: He is well-developed.  HENT:     Head: Normocephalic and atraumatic.  Eyes:     General: No scleral icterus.    Conjunctiva/sclera: Conjunctivae normal.     Pupils: Pupils are equal, round, and reactive to light.  Neck:     Vascular: No JVD.     Trachea: No tracheal deviation.  Cardiovascular:      Rate and Rhythm: Normal rate and regular rhythm.     Heart sounds: Normal heart sounds. No murmur heard. Pulmonary:     Effort: Pulmonary effort is normal. No tachypnea, accessory muscle usage or respiratory distress.     Breath sounds: No stridor. No wheezing, rhonchi or rales.     Comments: Bibasilar inspiratory crackles Abdominal:     General: Bowel sounds are normal. There is no distension.     Palpations: Abdomen is soft.     Tenderness: There is no abdominal tenderness.  Musculoskeletal:  General: No tenderness.     Cervical back: Neck supple.  Lymphadenopathy:     Cervical: No cervical adenopathy.  Skin:    General: Skin is warm and dry.     Capillary Refill: Capillary refill takes less than 2 seconds.     Findings: No rash.  Neurological:     Mental Status: He is alert and oriented to person, place, and time.  Psychiatric:        Behavior: Behavior normal.     Vitals:   09/26/20 1527  BP: 124/68  Pulse: 72  Temp: 97.7 F (36.5 C)  TempSrc: Oral  SpO2: 99%  Weight: 182 lb 6.4 oz (82.7 kg)  Height: 5\' 9"  (1.753 m)   99% on RA BMI Readings from Last 3 Encounters:  09/26/20 26.94 kg/m  06/11/20 27.94 kg/m  04/27/20 28.50 kg/m   Wt Readings from Last 3 Encounters:  09/26/20 182 lb 6.4 oz (82.7 kg)  06/11/20 189 lb 3.2 oz (85.8 kg)  04/27/20 193 lb (87.5 kg)     CBC    Component Value Date/Time   WBC 8.2 12/21/2019 1004   WBC 10.1 11/19/2019 1217   RBC 3.95 (L) 12/21/2019 1004   RBC 4.65 11/19/2019 1217   HGB 11.9 (L) 12/21/2019 1004   HCT 36.1 (L) 12/21/2019 1004   PLT 203 12/21/2019 1004   MCV 91 12/21/2019 1004   MCH 30.1 12/21/2019 1004   MCH 30.3 11/19/2019 1217   MCHC 33.0 12/21/2019 1004   MCHC 34.0 11/19/2019 1217   RDW 13.9 12/21/2019 1004   LYMPHSABS 1.5 12/21/2019 1004   EOSABS 0.5 (H) 12/21/2019 1004   BASOSABS 0.0 12/21/2019 1004    Chest Imaging: No recent chest imaging.  CTA of the chest completed in 2021: Bilateral  lower lobe early honeycombing and evidence of interstitial lung disease. The patient's images have been independently reviewed by me.    Pulmonary Functions Testing Results: No flowsheet data found.  FeNO:   Pathology:   Echocardiogram:   Heart Catheterization:     Assessment & Plan:     ICD-10-CM   1. Interstitial pulmonary disease (HCC)  J84.9 CT CHEST HIGH RESOLUTION    2. Cough  R05.9     3. SOB (shortness of breath)  R06.02       Discussion:  This is a 75 year old gentleman with various symptoms related to the chest.  He has a centralized chest tightness that happens when he is exerting himself.  He has dry cough that is nonproductive.  He occasionally feels like he has trouble breathing when he exerts himself.  He has used albuterol in the past that has not made much difference.  He does have some seasonal allergy symptoms as well.  No other previous asthma type symptoms.  His pulmonary function test which were completed at the [facility] are rather reassuring, normal ratio, no evidence of obstruction.  Plan: Follow-up HRCT imaging, his recent images were in November 2021 with lower lobe basilar changes. If he has persistent interstitial lower lobe changes concerning for interstitial lung disease would consider consultation in our ILD clinic with Dr. December 2021. After HRCT is complete we will make appropriate next steps. In the meantime we will give him a trial of bronchodilators and inhalers. Samples given today of Breo. Continue albuterol prior to any kind of activity to see if this helps any of his symptoms.   Current Outpatient Medications:    aspirin 81 MG EC tablet, Take 81 mg by mouth  daily. , Disp: , Rfl:    Cholecalciferol (VITAMIN D3) 25 MCG (1000 UT) CAPS, Take 1,000 Units by mouth daily., Disp: , Rfl:    cyanocobalamin 1000 MCG tablet, Take 1,000 mcg by mouth daily., Disp: , Rfl:    empagliflozin (JARDIANCE) 10 MG TABS tablet, Take 5 mg by mouth in the morning and  at bedtime., Disp: , Rfl:    ferrous sulfate 325 (65 FE) MG tablet, Take 325 mg by mouth daily with breakfast., Disp: , Rfl:    fluticasone furoate-vilanterol (BREO ELLIPTA) 200-25 MCG/INH AEPB, Inhale 1 puff into the lungs daily., Disp: 14 each, Rfl: 0   glipiZIDE (GLUCOTROL) 10 MG tablet, Take 5 mg by mouth 2 (two) times daily before a meal., Disp: , Rfl:    magnesium oxide (MAG-OX) 400 MG tablet, Take 400 mg by mouth 2 (two) times daily., Disp: , Rfl:    meclizine (ANTIVERT) 25 MG tablet, Take 25 mg by mouth 3 (three) times daily as needed for dizziness., Disp: , Rfl:    metFORMIN (GLUCOPHAGE) 1000 MG tablet, Take 1 tablet (1,000 mg total) by mouth in the morning and at bedtime., Disp: , Rfl:    Multiple Vitamin (MULTI-VITAMIN) tablet, Take 1 tablet by mouth daily., Disp: , Rfl:    omeprazole (PRILOSEC) 20 MG capsule, Take 20 mg by mouth See admin instructions. Every other night, Disp: , Rfl:    rosuvastatin (CRESTOR) 40 MG tablet, Take 40 mg by mouth at bedtime., Disp: , Rfl:    albuterol (VENTOLIN HFA) 108 (90 Base) MCG/ACT inhaler, Inhale 2 puffs into the lungs every 6 (six) hours as needed for wheezing or shortness of breath., Disp: 8 g, Rfl: 5   isosorbide mononitrate (IMDUR) 30 MG 24 hr tablet, Take 0.5 tablets (15 mg total) by mouth daily., Disp: 45 tablet, Rfl: 3   Josephine IgoBradley L Rachael Zapanta, DO [facility] 09/26/2020 4:20 PM

## 2020-10-09 ENCOUNTER — Ambulatory Visit (INDEPENDENT_AMBULATORY_CARE_PROVIDER_SITE_OTHER): Payer: Medicare HMO

## 2020-10-09 DIAGNOSIS — I442 Atrioventricular block, complete: Secondary | ICD-10-CM | POA: Diagnosis not present

## 2020-10-10 LAB — CUP PACEART REMOTE DEVICE CHECK
Battery Remaining Longevity: 144 mo
Battery Remaining Percentage: 100 %
Brady Statistic RA Percent Paced: 2 %
Brady Statistic RV Percent Paced: 100 %
Date Time Interrogation Session: 20220906221900
Implantable Lead Implant Date: 20211122
Implantable Lead Implant Date: 20211122
Implantable Lead Location: 753859
Implantable Lead Location: 753860
Implantable Lead Model: 7841
Implantable Lead Model: 7842
Implantable Lead Serial Number: 1057413
Implantable Lead Serial Number: 1098402
Implantable Pulse Generator Implant Date: 20211122
Lead Channel Impedance Value: 631 Ohm
Lead Channel Impedance Value: 790 Ohm
Lead Channel Pacing Threshold Amplitude: 0.4 V
Lead Channel Pacing Threshold Amplitude: 0.7 V
Lead Channel Pacing Threshold Pulse Width: 0.4 ms
Lead Channel Pacing Threshold Pulse Width: 0.4 ms
Lead Channel Setting Pacing Amplitude: 2 V
Lead Channel Setting Pacing Amplitude: 2.5 V
Lead Channel Setting Pacing Pulse Width: 0.4 ms
Lead Channel Setting Sensing Sensitivity: 2.5 mV
Pulse Gen Serial Number: 962270

## 2020-10-16 ENCOUNTER — Other Ambulatory Visit: Payer: Self-pay

## 2020-10-16 ENCOUNTER — Encounter: Payer: Self-pay | Admitting: Internal Medicine

## 2020-10-16 ENCOUNTER — Ambulatory Visit: Payer: Medicare HMO | Admitting: Internal Medicine

## 2020-10-16 VITALS — BP 133/73 | HR 83 | Ht 69.0 in | Wt 179.8 lb

## 2020-10-16 DIAGNOSIS — R0602 Shortness of breath: Secondary | ICD-10-CM | POA: Diagnosis not present

## 2020-10-16 DIAGNOSIS — I10 Essential (primary) hypertension: Secondary | ICD-10-CM

## 2020-10-16 DIAGNOSIS — I2510 Atherosclerotic heart disease of native coronary artery without angina pectoris: Secondary | ICD-10-CM | POA: Diagnosis not present

## 2020-10-16 DIAGNOSIS — I442 Atrioventricular block, complete: Secondary | ICD-10-CM

## 2020-10-16 DIAGNOSIS — Z950 Presence of cardiac pacemaker: Secondary | ICD-10-CM | POA: Diagnosis not present

## 2020-10-16 MED ORDER — LOSARTAN POTASSIUM 25 MG PO TABS: 25.0000 mg | ORAL_TABLET | Freq: Every day | ORAL | 3 refills | Status: DC

## 2020-10-16 NOTE — Patient Instructions (Signed)
Medication Instructions:  START losartan 25mg  daily  STOP isosorbide mononitrate  Please call Dr. office regarding the [facility] prescription   *If you need a refill on your cardiac medications before your next appointment, please call your pharmacy*  Follow-Up: At [facility], you and your health needs are our priority.  As part of our continuing mission to provide you with exceptional heart care, we have created designated Provider Care Teams.  These Care Teams include your primary Cardiologist (physician) and Advanced Practice Providers (APPs -  Physician Assistants and Nurse Practitioners) who all work together to provide you with the care you need, when you need it.  We recommend signing up for the patient portal called "MyChart".  Sign up information is provided on this After Visit Summary.  MyChart is used to connect with patients for Virtual Visits (Telemedicine).  Patients are able to view lab/test results, encounter notes, upcoming appointments, etc.  Non-urgent messages can be sent to your provider as well.   To learn more about what you can do with MyChart, go to [facility].    Your next appointment:   6 month(s)  The format for your next appointment:   In Person  Provider:   You may see [email or web address], MD or one of the following Advanced Practice Providers on your designated Care Team:   Chrystie Nose, PA-C Azalee Course, PA-C or  Micah Flesher, Judy Pimple   Other Instructions

## 2020-10-16 NOTE — Progress Notes (Signed)
OFFICE NOTE  Chief Complaint:  Follow-up  Primary Care Physician: [facility]  HPI:  Scott Price is a 75 y.o. male with a past medial history significant for type 2 diabetes and dyslipidemia with a family history of heart disease in multiple brothers.  Scott Price was seen over the weekend in the [facility], ER with progressive chest discomfort shortness of breath and dizziness.  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  He says during these episodes he gets chest tightness, fatigue and dizziness and has to sit down with some relief but his symptoms have been worsening.  He was seen in the ER again and ruled out for MI.  During his monitoring, he was noted to have predominantly sinus rhythm although he did have one short strip of heart block.  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  Today in follow-up his EKG demonstrates normal sinus rhythm at 75 without ischemic changes.  12/13/2019  Scott Price returns today for follow-up.  When I last saw him I was highly concerned about unstable angina.  He was scheduled the next day for cardiac catheterization.  Cardiac cath on November 22, 2019 showed only mild nonobstructive coronary disease with about 25% stenoses in all 3 coronary vessels.  Normal LVEF of 55 to 65% with normal LVEDP and no evidence of aortic valve stenosis.  Overall very reassuring however since then he has continued to have lifestyle limiting symptoms of chest pain, fatigue, dizziness and shortness of breath.  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  He does not have a primary care provider but did establish with a PCP again at the [facility] hospital but does not have an appointment until December.  He reportedly has a significant smoking history but quit a  number of years ago.  I would estimate between 50 to 75 pack years.  This is concerning for possible COPD as a cause of his symptoms.  Other possible causes could be aneurysm or perhaps pulmonary embolus.  I think that is less likely since his symptoms have been going on for some time but should be considered.  03/05/2020  Scott Price returns today for follow-up.  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  Prior to that he had a heart catheterization in October 2021 which showed nonobstructive multivessel coronary disease.  EF was normal.  He does have coronary calcification.  Since he had his pacemaker placed he is done very well.  A dual-chamber [employer] device.  He did not receive a remote monitor but is scheduled for remote pacer check in February.  He also has follow-up with Dr. Ladona Ridgel on February 28.  His CT scan did show honeycombing and some signs of interstitial lung disease in November.  I advised him to get pulmonary function testing and was again referred him to pulmonary however he would like to do this testing and follow-up through the [facility] health system.  Currently seeing a provider there to schedule this.  04/27/2020  Scott Price is seen today for follow-up.  He has continued to have worsening shortness of breath.  He says he has to sleep upright in a recliner.  Has  had a little bit of swelling in his right ankle and continues to have some persistent crackles in the left lung base.  He underwent pulmonary function testing at the [place] interpreted by Dr. Marcelyn Bruins.  This demonstrated no evidence of obstruction, restriction or diffusion defect.  Based on that I am more concerned that this could be some heart failure.  Previously his CT angio did show honeycombing and suggestion of interstitial lung disease however possibly this was fluid.  His BNP however had been low at the time.  06/11/2020  Scott Price is seen today in  follow-up.  After starting him on some Lasix he reports no significant improvement in his shortness of breath however did have some improvement in his swelling.  BNP was low at retest, negative for heart failure.  He also had an unfortunate rash on Lasix.  Apparently has a history of sulfa allergy and it may have been the sulfa moiety in the drug that caused him the reaction.  Repeat echo showed normal systolic function with mild diastolic dysfunction but did not really suggest heart failure.  He still reports shortness of breath and that he does get situational chest tightness.  He says it feels like a squeezing in the mid central chest which is worse with exertion and relieved by rest.  These findings more suggestive of angina although his catheterization showed only mild nonobstructive coronary disease last year.  He is a diabetic and therefore possibly could have some small vessel disease.  10/16/2020  Scott Price returns today for follow-up.  Overall he says he seems to be doing a little bit better.  He did not really note a big difference in his chest discomfort with isosorbide however was placed on Breo Ellipta by Dr. Tonia Brooms and noted a significant improvement in this.  Unfortunately recently ran out of the medicine.  He does have a follow-up high-resolution CT to reexamine his interstitial prominence and sees Dr. Tonia Brooms in October.  I have encouraged him to reach out for refill of that medication.  He denies any further syncopal events after pacemaker for complete heart block.  PMHx:  Past Medical History:  Diagnosis Date   Diabetes mellitus without complication (HCC)    High cholesterol     Past Surgical History:  Procedure Laterality Date   BACK SURGERY     LEFT HEART CATH AND CORONARY ANGIOGRAPHY N/A 11/22/2019   Procedure: LEFT HEART CATH AND CORONARY ANGIOGRAPHY;  Surgeon: Corky Crafts, MD;  Location: [facility] INVASIVE CV LAB;  Service: Cardiovascular;  Laterality: N/A;   NECK SURGERY      PACEMAKER IMPLANT N/A 12/26/2019   Procedure: PACEMAKER IMPLANT;  Surgeon: Marinus Maw, MD;  Location: [facility] INVASIVE CV LAB;  Service: Cardiovascular;  Laterality: N/A;    FAMHx:  Family History  Problem Relation Age of Onset   CAD Brother    CAD Brother     SOCHx:   reports that he has never smoked. He has never used smokeless tobacco. He reports that he does not currently use alcohol. He reports current drug use. Drug: Marijuana.  ALLERGIES:  Allergies  Allergen Reactions   Doxycycline Shortness Of Breath and Rash   Amoxicillin Rash   Lasix [Furosemide] Rash   Prednisone     High blood sugar   Sulfa Antibiotics Rash    Headache/ Chest tightness/ stiff neck    ROS: Pertinent items noted in HPI and remainder of comprehensive ROS otherwise negative.  HOME MEDS: Current Outpatient Medications  on File Prior to Visit  Medication Sig Dispense Refill   albuterol (VENTOLIN HFA) 108 (90 Base) MCG/ACT inhaler Inhale 2 puffs into the lungs every 6 (six) hours as needed for wheezing or shortness of breath. 8 g 5   aspirin 81 MG EC tablet Take 81 mg by mouth daily.      Cholecalciferol (VITAMIN D3) 25 MCG (1000 UT) CAPS Take 1,000 Units by mouth daily.     cyanocobalamin 1000 MCG tablet Take 1,000 mcg by mouth daily.     empagliflozin (JARDIANCE) 10 MG TABS tablet Take 5 mg by mouth in the morning and at bedtime.     ferrous sulfate 325 (65 FE) MG tablet Take 325 mg by mouth daily with breakfast.     fluticasone furoate-vilanterol (BREO ELLIPTA) 200-25 MCG/INH AEPB Inhale 1 puff into the lungs daily. 14 each 0   glipiZIDE (GLUCOTROL) 10 MG tablet Take 5 mg by mouth 2 (two) times daily before a meal.     magnesium oxide (MAG-OX) 400 MG tablet Take 400 mg by mouth 2 (two) times daily.     meclizine (ANTIVERT) 25 MG tablet Take 25 mg by mouth 3 (three) times daily as needed for dizziness.     metFORMIN (GLUCOPHAGE) 1000 MG tablet Take 1 tablet (1,000 mg total) by mouth in the morning and  at bedtime.     Multiple Vitamin (MULTI-VITAMIN) tablet Take 1 tablet by mouth daily.     omeprazole (PRILOSEC) 20 MG capsule Take 20 mg by mouth See admin instructions. Every other night     rosuvastatin (CRESTOR) 40 MG tablet Take 40 mg by mouth at bedtime.     isosorbide mononitrate (IMDUR) 30 MG 24 hr tablet Take 0.5 tablets (15 mg total) by mouth daily. 45 tablet 3   No current facility-administered medications on file prior to visit.    LABS/IMAGING: No results found for this or any previous visit (from the past 48 hour(s)). No results found.  LIPID PANEL: No results found for: CHOL, TRIG, HDL, CHOLHDL, VLDL, LDLCALC, LDLDIRECT   WEIGHTS: Wt Readings from Last 3 Encounters:  10/16/20 179 lb 12.8 oz (81.6 kg)  09/26/20 182 lb 6.4 oz (82.7 kg)  06/11/20 189 lb 3.2 oz (85.8 kg)    VITALS: BP 133/73   Pulse 83   Ht 5\' 9"  (1.753 m)   Wt 179 lb 12.8 oz (81.6 kg)   SpO2 97%   BMI 26.55 kg/m   EXAM: General appearance: alert and no distress Neck: no carotid bruit, no JVD and thyroid not enlarged, symmetric, no tenderness/mass/nodules Lungs: rales bibasilar Heart: regular rate and rhythm, S1, S2 normal, no murmur, click, rub or gallop Abdomen: soft, non-tender; bowel sounds normal; no masses,  no organomegaly Extremities: extremities normal, atraumatic, no cyanosis or edema Pulses: 2+ and symmetric Skin: Skin color, texture, turgor normal. No rashes or lesions Neurologic: Grossly normal Psych: Pleasant  EKG: Atrial sensed and V paced rhythm at 83-personally reviewed  ASSESSMENT: Chest pain, fatigue, dyspnea - mild non-obstructive CAD by cath (11/2019) Complete heart block status post dual-chamber [employer] pacemaker (12/2019) Imaging evidence of possible interstitial lung disease by CT (12/2019) Hypertension Type 2 diabetes Family history of premature coronary disease in multiple brothers 23-75 pack/yr smoking history, quit  PLAN: 1.   Scott Price notes some  improvement in his chest tightness primarily with Breo inhaler.  He has follow-up with his pulmonologist in October.  Since there was no significant improvement on isosorbide and he had minimal  coronary disease, would recommend discontinuing that.  He was taken off of his ACE inhibitor because of a possible allergy.  Because he does have some coronary disease and may have an increased tendency towards hypertension, would recommend starting low-dose losartan 25 mg daily.  Plan follow-up with me in 6 months or sooner as necessary.  Chrystie Nose, MD, [facility], FACP  [place]  [facility]  Medical Director of the Advanced Lipid Disorders &  Cardiovascular Risk Reduction Clinic Diplomate of the American Board of Clinical Lipidology Attending Cardiologist  Direct Dial: [phone]  Fax: [phone]  Website:  [email or web address] Coulon 10/16/2020, 3:23 PM

## 2020-10-17 ENCOUNTER — Telehealth: Payer: Self-pay | Admitting: Pulmonary Disease

## 2020-10-17 NOTE — Progress Notes (Signed)
Remote pacemaker transmission.   

## 2020-10-17 NOTE — Telephone Encounter (Signed)
ATC patient, LMTCB 

## 2020-10-18 MED ORDER — FLUTICASONE FUROATE-VILANTEROL 200-25 MCG/INH IN AEPB: 1.0000 | INHALATION_SPRAY | Freq: Every day | RESPIRATORY_TRACT | 5 refills | Status: DC

## 2020-10-18 NOTE — Telephone Encounter (Signed)
Breo sent to pharmacy of choice.   Call made to patient, confirmed DOB. Made aware script has been sent to pharmacy. Voiced understanding.   Nothing further needed at this time.

## 2020-10-26 ENCOUNTER — Ambulatory Visit (HOSPITAL_COMMUNITY): Payer: Medicare HMO

## 2020-11-23 ENCOUNTER — Encounter: Payer: Self-pay | Admitting: Pulmonary Disease

## 2020-11-23 ENCOUNTER — Ambulatory Visit (INDEPENDENT_AMBULATORY_CARE_PROVIDER_SITE_OTHER): Payer: No Typology Code available for payment source | Admitting: Pulmonary Disease

## 2020-11-23 ENCOUNTER — Other Ambulatory Visit: Payer: Self-pay

## 2020-11-23 VITALS — BP 116/64 | HR 77 | Temp 98.1°F | Ht 69.0 in | Wt 185.0 lb

## 2020-11-23 DIAGNOSIS — R0609 Other forms of dyspnea: Secondary | ICD-10-CM | POA: Diagnosis not present

## 2020-11-23 DIAGNOSIS — J849 Interstitial pulmonary disease, unspecified: Secondary | ICD-10-CM | POA: Diagnosis not present

## 2020-11-23 MED ORDER — ALBUTEROL SULFATE HFA 108 (90 BASE) MCG/ACT IN AERS: 2.0000 | INHALATION_SPRAY | Freq: Four times a day (QID) | RESPIRATORY_TRACT | 5 refills | Status: DC | PRN

## 2020-11-23 MED ORDER — FLUTICASONE FUROATE-VILANTEROL 100-25 MCG/ACT IN AEPB: 1.0000 | INHALATION_SPRAY | Freq: Every day | RESPIRATORY_TRACT | 6 refills | Status: DC

## 2020-11-23 NOTE — Progress Notes (Signed)
Synopsis: Referred in august 2022 for SOB by [facility]  Subjective:   [name] GENDER: male DOB: 06-Nov-1945, MRN: [id number]  Chief Complaint  Patient presents with   Follow-up    PMH DMII, high cholesterol. He has recurrent issues with chest tightness. Dry cough, usually non-productive. Occasionally in the morning her has productive cough. He has gerd and takes a PPI. If he does any activity it makes the episodes worse. Hot shower and activity makes it worse. No seasonal changes that he notices. He does have sinus issues at times. Retired at a Programme researcher, broadcasting/film/video. Was in [employer], [place] [employer] - 1971. No pets in the house. PFTS at [facility] ratio normal, FEV1 2L, normal dlco, normal tlc. He stays active and still working part time. He has used albuterol in the past prior to getting his pacemaker.   CT scan of the chest was completed in November 2021 which revealed lower lobe honeycombing and early evidence of interstitial lung disease.  OV 11/23/2020: here to day for follow up. Unfortunately he did not have his CT Chest complete. It was set up through the [place] and he was not prepared to pay for it. His cough is better. His chest tightness is better as well.  Has been using his inhalers.   Past Medical History:  Diagnosis Date   Diabetes mellitus without complication (HCC)    High cholesterol      Family History  Problem Relation Age of Onset   CAD Brother    CAD Brother      Past Surgical History:  Procedure Laterality Date   BACK SURGERY     LEFT HEART CATH AND CORONARY ANGIOGRAPHY N/A 11/22/2019   Procedure: LEFT HEART CATH AND CORONARY ANGIOGRAPHY;  Surgeon: Corky Crafts, MD;  Location: [facility] INVASIVE CV LAB;  Service: Cardiovascular;  Laterality: N/A;   NECK SURGERY     PACEMAKER IMPLANT N/A 12/26/2019   Procedure: PACEMAKER IMPLANT;  Surgeon: Marinus Maw, MD;  Location: [facility] INVASIVE CV LAB;  Service: Cardiovascular;  Laterality: N/A;     Social History   Socioeconomic History   Marital status: Married    Spouse name: Not on file   Number of children: Not on file   Years of education: Not on file   Highest education level: Not on file  Occupational History   Not on file  Tobacco Use   Smoking status: Never   Smokeless tobacco: Never  Substance and Sexual Activity   Alcohol use: Not Currently   Drug use: Yes    Types: Marijuana   Sexual activity: Not on file  Other Topics Concern   Not on file  Social History Narrative   Not on file   Social Determinants of Health   Financial Resource Strain: Not on file  Food Insecurity: Not on file  Transportation Needs: Not on file  Physical Activity: Not on file  Stress: Not on file  Social Connections: Not on file  Intimate Partner Violence: Not on file     Allergies  Allergen Reactions   Doxycycline Shortness Of Breath and Rash   Lisinopril Cough   Amoxicillin Rash   Lasix [Furosemide] Rash   Prednisone     High blood sugar   Sulfa Antibiotics Rash    Headache/ Chest tightness/ stiff neck     Outpatient Medications Prior to Visit  Medication Sig Dispense Refill   albuterol (VENTOLIN HFA) 108 (90 Base) MCG/ACT inhaler Inhale 2 puffs into  the lungs every 6 (six) hours as needed for wheezing or shortness of breath. 8 g 5   aspirin 81 MG EC tablet Take 81 mg by mouth daily.      Cholecalciferol (VITAMIN D3) 25 MCG (1000 UT) CAPS Take 1,000 Units by mouth daily.     cyanocobalamin 1000 MCG tablet Take 1,000 mcg by mouth daily.     empagliflozin (JARDIANCE) 10 MG TABS tablet Take 5 mg by mouth in the morning and at bedtime.     ferrous sulfate 325 (65 FE) MG tablet Take 325 mg by mouth daily with breakfast.     fluticasone furoate-vilanterol (BREO ELLIPTA) 200-25 MCG/INH AEPB Inhale 1 puff into the lungs daily. 60 each 5   glipiZIDE (GLUCOTROL) 10 MG tablet Take 5 mg by mouth 2 (two) times daily before a meal.     losartan (COZAAR) 25 MG tablet Take 1  tablet (25 mg total) by mouth daily. 90 tablet 3   magnesium oxide (MAG-OX) 400 MG tablet Take 400 mg by mouth 2 (two) times daily.     meclizine (ANTIVERT) 25 MG tablet Take 25 mg by mouth 3 (three) times daily as needed for dizziness.     metFORMIN (GLUCOPHAGE) 1000 MG tablet Take 1 tablet (1,000 mg total) by mouth in the morning and at bedtime.     Multiple Vitamin (MULTI-VITAMIN) tablet Take 1 tablet by mouth daily.     omeprazole (PRILOSEC) 20 MG capsule Take 20 mg by mouth See admin instructions. Every other night     rosuvastatin (CRESTOR) 40 MG tablet Take 40 mg by mouth at bedtime.     No facility-administered medications prior to visit.    Review of Systems  Constitutional:  Negative for chills, fever, malaise/fatigue and weight loss.  HENT:  Negative for hearing loss, sore throat and tinnitus.   Eyes:  Negative for blurred vision and double vision.  Respiratory:  Positive for shortness of breath. Negative for cough, hemoptysis, sputum production, wheezing and stridor.   Cardiovascular:  Negative for chest pain, palpitations, orthopnea, leg swelling and PND.  Gastrointestinal:  Negative for abdominal pain, constipation, diarrhea, heartburn, nausea and vomiting.  Genitourinary:  Negative for dysuria, hematuria and urgency.  Musculoskeletal:  Negative for joint pain and myalgias.  Skin:  Negative for itching and rash.  Neurological:  Negative for dizziness, tingling, weakness and headaches.  Endo/Heme/Allergies:  Negative for environmental allergies. Does not bruise/bleed easily.  Psychiatric/Behavioral:  Negative for depression. The patient is not nervous/anxious and does not have insomnia.   All other systems reviewed and are negative.   Objective:  Physical Exam Vitals reviewed.  Constitutional:      General: He is not in acute distress.    Appearance: He is well-developed.  HENT:     Head: Normocephalic and atraumatic.  Eyes:     General: No scleral icterus.     Conjunctiva/sclera: Conjunctivae normal.     Pupils: Pupils are equal, round, and reactive to light.  Neck:     Vascular: No JVD.     Trachea: No tracheal deviation.  Cardiovascular:     Rate and Rhythm: Normal rate and regular rhythm.     Heart sounds: Normal heart sounds. No murmur heard. Pulmonary:     Effort: Pulmonary effort is normal. No tachypnea, accessory muscle usage or respiratory distress.     Breath sounds: No stridor. No wheezing, rhonchi or rales.     Comments: Bibasilar crackles Abdominal:     General: Bowel sounds  are normal. There is no distension.     Palpations: Abdomen is soft.     Tenderness: There is no abdominal tenderness.  Musculoskeletal:        General: No tenderness.     Cervical back: Neck supple.  Lymphadenopathy:     Cervical: No cervical adenopathy.  Skin:    General: Skin is warm and dry.     Capillary Refill: Capillary refill takes less than 2 seconds.     Findings: No rash.  Neurological:     Mental Status: He is alert and oriented to person, place, and time.  Psychiatric:        Behavior: Behavior normal.     Vitals:   11/23/20 1433  BP: 116/64  Pulse: 77  Temp: 98.1 F (36.7 C)  TempSrc: Oral  SpO2: 98%  Weight: 185 lb (83.9 kg)  Height: 5\' 9"  (1.753 m)   98% on RA BMI Readings from Last 3 Encounters:  11/23/20 27.32 kg/m  10/16/20 26.55 kg/m  09/26/20 26.94 kg/m   Wt Readings from Last 3 Encounters:  11/23/20 185 lb (83.9 kg)  10/16/20 179 lb 12.8 oz (81.6 kg)  09/26/20 182 lb 6.4 oz (82.7 kg)     CBC    Component Value Date/Time   WBC 8.2 12/21/2019 1004   WBC 10.1 11/19/2019 1217   RBC 3.95 (L) 12/21/2019 1004   RBC 4.65 11/19/2019 1217   HGB 11.9 (L) 12/21/2019 1004   HCT 36.1 (L) 12/21/2019 1004   PLT 203 12/21/2019 1004   MCV 91 12/21/2019 1004   MCH 30.1 12/21/2019 1004   MCH 30.3 11/19/2019 1217   MCHC 33.0 12/21/2019 1004   MCHC 34.0 11/19/2019 1217   RDW 13.9 12/21/2019 1004   LYMPHSABS 1.5  12/21/2019 1004   EOSABS 0.5 (H) 12/21/2019 1004   BASOSABS 0.0 12/21/2019 1004    Chest Imaging: No recent chest imaging.  CTA of the chest completed in 2021: Bilateral lower lobe early honeycombing and evidence of interstitial lung disease. The patient's images have been independently reviewed by me.    Pulmonary Functions Testing Results: No flowsheet data found.  FeNO:   Pathology:   Echocardiogram:   Heart Catheterization:     Assessment & Plan:     ICD-10-CM   1. Interstitial pulmonary disease (HCC)  J84.9 CT CHEST HIGH RESOLUTION    2. DOE (dyspnea on exertion)  R06.09       Discussion:  This is a 75 year old gentleman with various ongoing related chest symptoms, chest tightness dry cough nonproductive.  He was supposed to have a HRCT scan of the chest prior to seeing me today in the office.  Unfortunately this did not happen.  Plan: I have reordered the HRCT imaging. This needs to be done. I reviewed images with the patient from his 2021 imaging. He has basilar changes that were concerning for early honeycombing. Do not want 2022 to miss a potential underlying diagnosis of ILD/IPF. Pending upon CT scan results we will get him to see Dr. [place] in ILD clinic. I explained this to him again in the office. At this time he can continue albuterol and Breo. HRCT next available    Current Outpatient Medications:    albuterol (VENTOLIN HFA) 108 (90 Base) MCG/ACT inhaler, Inhale 2 puffs into the lungs every 6 (six) hours as needed for wheezing or shortness of breath., Disp: 8 g, Rfl: 5   aspirin 81 MG EC tablet, Take 81 mg by mouth daily. ,  Disp: , Rfl:    Cholecalciferol (VITAMIN D3) 25 MCG (1000 UT) CAPS, Take 1,000 Units by mouth daily., Disp: , Rfl:    cyanocobalamin 1000 MCG tablet, Take 1,000 mcg by mouth daily., Disp: , Rfl:    empagliflozin (JARDIANCE) 10 MG TABS tablet, Take 5 mg by mouth in the morning and at bedtime., Disp: , Rfl:    ferrous sulfate 325  (65 FE) MG tablet, Take 325 mg by mouth daily with breakfast., Disp: , Rfl:    fluticasone furoate-vilanterol (BREO ELLIPTA) 200-25 MCG/INH AEPB, Inhale 1 puff into the lungs daily., Disp: 60 each, Rfl: 5   glipiZIDE (GLUCOTROL) 10 MG tablet, Take 5 mg by mouth 2 (two) times daily before a meal., Disp: , Rfl:    losartan (COZAAR) 25 MG tablet, Take 1 tablet (25 mg total) by mouth daily., Disp: 90 tablet, Rfl: 3   magnesium oxide (MAG-OX) 400 MG tablet, Take 400 mg by mouth 2 (two) times daily., Disp: , Rfl:    meclizine (ANTIVERT) 25 MG tablet, Take 25 mg by mouth 3 (three) times daily as needed for dizziness., Disp: , Rfl:    metFORMIN (GLUCOPHAGE) 1000 MG tablet, Take 1 tablet (1,000 mg total) by mouth in the morning and at bedtime., Disp: , Rfl:    Multiple Vitamin (MULTI-VITAMIN) tablet, Take 1 tablet by mouth daily., Disp: , Rfl:    omeprazole (PRILOSEC) 20 MG capsule, Take 20 mg by mouth See admin instructions. Every other night, Disp: , Rfl:    rosuvastatin (CRESTOR) 40 MG tablet, Take 40 mg by mouth at bedtime., Disp: , Rfl:    Josephine Igo, DO  Pulmonary Critical Care 11/23/2020 2:55 PM

## 2020-11-23 NOTE — Patient Instructions (Addendum)
Thank you for visiting Dr. Tonia Brooms at [facility]. Today we recommend the following:  Orders Placed This Encounter  Procedures   CT CHEST HIGH RESOLUTION   No matter where you get the HRCT complete here or [facility], please send me a copy of the disc and results.   HRCT to be scheduled next available.   Return in about 6 months (around 05/24/2021) for with APP or Dr. Tonia Brooms.    Please do your part to reduce the spread of COVID-19.

## 2020-11-27 ENCOUNTER — Ambulatory Visit (HOSPITAL_COMMUNITY)
Admission: RE | Admit: 2020-11-27 | Discharge: 2020-11-27 | Disposition: A | Discharge status: Home / Self Care | Payer: No Typology Code available for payment source | Source: Ambulatory Visit | Attending: Pulmonary Disease | Admitting: Pulmonary Disease

## 2020-11-27 ENCOUNTER — Other Ambulatory Visit: Payer: Self-pay

## 2020-11-27 DIAGNOSIS — J849 Interstitial pulmonary disease, unspecified: Secondary | ICD-10-CM | POA: Diagnosis not present

## 2020-12-14 NOTE — Progress Notes (Signed)
Please let patient know that I have reviewed his CT scan results.  I think he would benefit from seeing one of our colleagues in the interstitial lung disease clinic.  Please schedule appointment with Dr. Marchelle Gearing for next available consult.  Can let him know that he has CT changes that are concerning for evidence of scarring that I believe needs further work-up.  Please send ILD questionnaire for patient to complete prior to office appointment with Dr. Marchelle Gearing.  Thanks,  BLI  Josephine Igo, DO [facility] 12/14/2020 10:46 AM

## 2020-12-20 ENCOUNTER — Ambulatory Visit (INDEPENDENT_AMBULATORY_CARE_PROVIDER_SITE_OTHER): Payer: Medicare HMO | Admitting: Internal Medicine

## 2020-12-20 ENCOUNTER — Other Ambulatory Visit: Payer: Self-pay

## 2020-12-20 ENCOUNTER — Encounter: Payer: Self-pay | Admitting: Internal Medicine

## 2020-12-20 ENCOUNTER — Telehealth: Payer: Self-pay | Admitting: Internal Medicine

## 2020-12-20 VITALS — BP 132/70 | HR 81 | Ht 69.0 in | Wt 179.4 lb

## 2020-12-20 DIAGNOSIS — J849 Interstitial pulmonary disease, unspecified: Secondary | ICD-10-CM | POA: Diagnosis not present

## 2020-12-20 DIAGNOSIS — R053 Chronic cough: Secondary | ICD-10-CM

## 2020-12-20 DIAGNOSIS — R0609 Other forms of dyspnea: Secondary | ICD-10-CM

## 2020-12-20 DIAGNOSIS — J84112 Idiopathic pulmonary fibrosis: Secondary | ICD-10-CM

## 2020-12-20 LAB — CBC WITH DIFFERENTIAL/PLATELET
Basophils Absolute: 0 10*3/uL (ref 0.0–0.1)
Basophils Relative: 0.5 % (ref 0.0–3.0)
Eosinophils Absolute: 0.3 10*3/uL (ref 0.0–0.7)
Eosinophils Relative: 3.8 % (ref 0.0–5.0)
HCT: 39.8 % (ref 39.0–52.0)
Hemoglobin: 13.2 g/dL (ref 13.0–17.0)
Lymphocytes Relative: 15.7 % (ref 12.0–46.0)
Lymphs Abs: 1.2 10*3/uL (ref 0.7–4.0)
MCHC: 33.3 g/dL (ref 30.0–36.0)
MCV: 88.1 fl (ref 78.0–100.0)
Monocytes Absolute: 0.6 10*3/uL (ref 0.1–1.0)
Monocytes Relative: 8.3 % (ref 3.0–12.0)
Neutro Abs: 5.3 10*3/uL (ref 1.4–7.7)
Neutrophils Relative %: 71.7 % (ref 43.0–77.0)
Platelets: 205 10*3/uL (ref 150.0–400.0)
RBC: 4.52 Mil/uL (ref 4.22–5.81)
RDW: 15 % (ref 11.5–15.5)
WBC: 7.4 10*3/uL (ref 4.0–10.5)

## 2020-12-20 LAB — BASIC METABOLIC PANEL
BUN: 14 mg/dL (ref 6–23)
CO2: 30 mEq/L (ref 19–32)
Calcium: 10.1 mg/dL (ref 8.4–10.5)
Chloride: 101 mEq/L (ref 96–112)
Creatinine, Ser: 1.08 mg/dL (ref 0.40–1.50)
GFR: 66.76 mL/min (ref >60.00)
Glucose, Bld: 166 mg/dL — ABNORMAL HIGH (ref 70–99)
Potassium: 4.2 mEq/L (ref 3.5–5.1)
Sodium: 139 mEq/L (ref 135–145)

## 2020-12-20 LAB — HEPATIC FUNCTION PANEL
ALT: 32 U/L (ref 0–53)
AST: 23 U/L (ref 0–37)
Albumin: 4.6 g/dL (ref 3.5–5.2)
Alkaline Phosphatase: 53 U/L (ref 39–117)
Bilirubin, Direct: 0.2 mg/dL (ref 0.0–0.3)
Total Bilirubin: 0.5 mg/dL (ref 0.2–1.2)
Total Protein: 7.1 g/dL (ref 6.0–8.3)

## 2020-12-20 LAB — SEDIMENTATION RATE: Sed Rate: 7 mm/hr (ref 0–20)

## 2020-12-20 NOTE — Telephone Encounter (Signed)
Scott Price is veteran. Needs esbriet  Thanks    SIGNATURE    Dr. Kalman Shan, M.D., F.C.C.P,  Pulmonary and Critical Care Medicine Staff Physician, [facility] Center Director - Interstitial Lung Disease  Program  Pulmonary Fibrosis [facility] Network at [facility] [place], [place], [address]  NPI Number:  NPI #[id number]  Pager: [phone], If no answer  -> Check AMION or Try [phone] Telephone (clinical office): [phone] Telephone (research): [phone]  3:14 PM 12/20/2020

## 2020-12-20 NOTE — Progress Notes (Signed)
IOV august 2022 for SOB by [facility]  Subjective:   [name]: male DOB: 08/08/1945, MRN: [id number]  Chief Complaint  Patient presents with   Consult    Pt is being referred due to chronic cough.  Pt states that he does have complaints of chest tightness and SOB with activities.      PMH DMII, high cholesterol. He has recurrent issues with chest tightness. Dry cough, usually non-productive. Occasionally in the morning her has productive cough. He has gerd and takes a PPI. If he does any activity it makes the episodes worse. Hot shower and activity makes it worse. No seasonal changes that he notices. He does have sinus issues at times. Retired at a Agricultural consultant. Was in [place], [place] 1966 - 1971. No pets in the house. PFTS at [facility] ratio normal, FEV1 2L, normal dlco, normal tlc. He stays active and still working part time. He has used albuterol in the past prior to getting his pacemaker.   CT scan of the chest was completed in November 2021 which revealed lower lobe honeycombing and early evidence of interstitial lung disease. xxxxxx  OV 11/23/2020: here to day for follow up. Unfortunately he did not have his CT Chest complete. It was set up through the [place] and he was not prepared to pay for it. His cough is better. His chest tightness is better as well.  Has been using his inhalers.  OV 12/20/2020 -transfer of care to the ILD center to Dr. Chase Caller.  Referred by Dr. June Leap  Subjective:  [name], male , DOB: 1945-04-05 , age 75 y.o. , MRN: [id number] , ADDRESS: [place] [address] [facility]CP [facility] Patient Care Team: [facility] as PCP - General Debara Pickett Nadean Corwin, MD as PCP - Cardiology (Cardiology)  This Provider for this visit: Treatment Team:  Attending Provider: Brand Males, MD    12/20/2020 -   Chief Complaint  Patient presents with   Follow-up    Chest  tightness     [name] 75 y.o. -seen in August 2022 in October 2022 by Dr. Valeta Harms.  He is here with his wife.  He is a English as a second language teacher. Dr Gwenette Greet at the [facility] in the area is retired.  Therefore he is seeing [place].  He reports insidious onset of shortness of breath at least a year.  This started around the time he had his pacemaker.  The pacemaker did get rid of his syncope but not the shortness of breath.  Is present on exertion relieved by rest.  Recently Dr. Valeta Harms started [facility] and this is helped his chest tightness on exertion but he still continues have significant shortness of breath with exertion and cough.  Documented below.  He is aware that he has pulmonary fibrosis.  His symptoms are somewhat progressive.  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  Shared with the family his results.  Review of the records indicate that he has not had serology so far.  An ILD questionnaire packet was mailed out to him but he has not received it yet so we do not have the data.  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  His other medications include Breo and omeprazole.  He has a history of diverticulitis.  No coronary artery disease but he has a pacemaker.  He is not on anticoagulation he is on aspirin  only.  His walking desaturation test shows adequate oxygenation with minimal pulse ox drop.  SYMPTOM SCALE - ILD 12/20/2020  Current weight   O2 use ra  Shortness of Breath 0 -> 5 scale with 5 being worst (score 6 If unable to do)  At rest 2  Simple tasks - showers, clothes change, eating, shaving 3  Household (dishes, doing bed, laundry) 4  Shopping 3  Walking level at own pace 3  Walking up Stairs Does not do  Total (30-36) Dyspnea Score 15  How bad is your cough? 4  How bad is your fatigue 4  How bad is nausea 0  How bad is vomiting?  0  How bad is diarrhea? 1  How bad is anxiety? 5  How bad is depression 0  Any chronic pain - if so where  and how bad 0      Simple office walk 185 feet x  3 laps goal with forehead probe 12/20/2020    O2 used ra   Number laps completed 3   Comments about pace 3   Resting Pulse Ox/HR 99% and 78/min   Final Pulse Ox/HR 97% and 96/min   Desaturated </= 88% no   Desaturated <= 3% points no   Got Tachycardic >/= 90/min yes   Symptoms at end of test Mild dyspnea   Miscellaneous comments x      CT Chest data - HRCT 11/27/20  Study Result  Narrative & Impression  CLINICAL DATA:  75 year old male with history of interstitial lung disease. Follow-up study.   EXAM: CT CHEST WITHOUT CONTRAST   TECHNIQUE: Multidetector CT imaging of the chest was performed following the standard protocol without intravenous contrast. High resolution imaging of the lungs, as well as inspiratory and expiratory imaging, was performed.   COMPARISON:  Chest CT 12/19/2019.   FINDINGS: Cardiovascular: Heart size is normal. There is no significant pericardial fluid, thickening or pericardial calcification. There is aortic atherosclerosis, as well as atherosclerosis of the great vessels of the mediastinum and the coronary arteries, including calcified atherosclerotic plaque in the left main, left anterior descending, left circumflex and right coronary arteries. Severe calcifications of the aortic valve and mitral annulus. Left-sided pacemaker device in place with lead tips terminating in the right atrial appendage and right ventricular apex.   Mediastinum/Nodes: No pathologically enlarged mediastinal or hilar lymph nodes. Please note that accurate exclusion of hilar adenopathy is limited on noncontrast CT scans. Esophagus is unremarkable in appearance. No axillary lymphadenopathy.   Lungs/Pleura: High-resolution images demonstrate widespread but patchy areas of peripheral predominant ground-glass attenuation, septal thickening, subpleural reticulation, cylindrical bronchiectasis and peripheral  bronchiolectasis. Findings appear progressive compared to the prior study. Findings have a definitive craniocaudal gradient. No frank honeycombing is confidently identified at this time. Inspiratory and expiratory imaging demonstrates some very mild air trapping in the lung bases indicative of mild small airways disease. No acute consolidative airspace disease. No pleural effusions. No suspicious appearing pulmonary nodules or masses are noted.   Upper Abdomen: Aortic atherosclerosis.   Musculoskeletal: Orthopedic fixation hardware in the lower cervical spine incidentally noted. There are no aggressive appearing lytic or blastic lesions noted in the visualized portions of the skeleton.   IMPRESSION: 1. Progressively worsening interstitial lung disease, 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 2. Aortic atherosclerosis, in addition to left main and 3 vessel coronary artery disease. Assessment for potential risk factor modification, dietary therapy or pharmacologic therapy may  be warranted, if clinically indicated. 3. There are calcifications of the aortic valve and mitral annulus. Echocardiographic correlation for evaluation of potential valvular dysfunction may be warranted if clinically indicated.   Aortic Atherosclerosis (ICD10-I70.0).     Electronically Signed   By: Vinnie Langton M.D.   On: 11/28/2020 12:46    No results found.  Cardoiac cath  oct 2021 Mid RCA lesion is 25% stenosed. Prox Cx to Mid Cx lesion is 25% stenosed. Prox LAD to Mid LAD lesion is 25% stenosed. The left ventricular systolic function is normal. LV end diastolic pressure is normal. The left ventricular ejection fraction is 55-65% by visual estimate. There is no aortic valve stenosis.   Nonobstructive CAD.  Continue preventive therapy  PFT  No flowsheet data found.   Latest Reference Range & Units 11/19/19 12:17   Creatinine 0.61 - 1.24 mg/dL 1.00    Latest Reference Range & Units 11/19/19 12:17  Hemoglobin 13.0 - 17.0 g/dL 14.1     has a past medical history of Diabetes mellitus without complication (HCC) and High cholesterol.   reports that he quit smoking about 22 years ago. His smoking use included cigarettes. He has never used smokeless tobacco.  Past Surgical History:  Procedure Laterality Date   BACK SURGERY     LEFT HEART CATH AND CORONARY ANGIOGRAPHY N/A 11/22/2019   Procedure: LEFT HEART CATH AND CORONARY ANGIOGRAPHY;  Surgeon: Jettie Booze, MD;  Location: [facility];  Service: Cardiovascular;  Laterality: N/A;   NECK SURGERY     PACEMAKER IMPLANT N/A 12/26/2019   Procedure: PACEMAKER IMPLANT;  Surgeon: Evans Lance, MD;  Location: [facility];  Service: Cardiovascular;  Laterality: N/A;    Allergies  Allergen Reactions   Doxycycline Shortness Of Breath and Rash   Lisinopril Cough   Amoxicillin Rash   Lasix [Furosemide] Rash   Prednisone     High blood sugar   Sulfa Antibiotics Rash    Headache/ Chest tightness/ stiff neck    Immunization History  Administered Date(s) Administered   Influenza Split 11/22/2012   Influenza, High Dose Seasonal PF 01/09/2020, 11/15/2020   Moderna Sars-Covid-2 Vaccination 04/11/2019, 05/09/2019, 01/17/2020, 06/26/2020   Pneumococcal Polysaccharide-23 09/24/2020    Family History  Problem Relation Age of Onset   CAD Brother    CAD Brother      Current Outpatient Medications:    albuterol (VENTOLIN HFA) 108 (90 Base) MCG/ACT inhaler, Inhale 2 puffs into the lungs every 6 (six) hours as needed for wheezing or shortness of breath., Disp: 8 g, Rfl: 5   aspirin 81 MG EC tablet, Take 81 mg by mouth daily. , Disp: , Rfl:    Cholecalciferol (VITAMIN D3) 25 MCG (1000 UT) CAPS, Take 1,000 Units by mouth daily., Disp: , Rfl:    cyanocobalamin 1000 MCG tablet, Take 1,000 mcg by mouth daily., Disp: , Rfl:    empagliflozin  (JARDIANCE) 10 MG TABS tablet, Take 5 mg by mouth in the morning and at bedtime., Disp: , Rfl:    ferrous sulfate 325 (65 FE) MG tablet, Take 325 mg by mouth daily with breakfast., Disp: , Rfl:    fluticasone furoate-vilanterol (BREO ELLIPTA) 100-25 MCG/ACT AEPB, Inhale 1 puff into the lungs daily., Disp: 60 each, Rfl: 6   glipiZIDE (GLUCOTROL) 10 MG tablet, Take 5 mg by mouth 2 (two) times daily before a meal., Disp: , Rfl:    losartan (COZAAR) 25 MG tablet, Take 1 tablet (25 mg total) by  mouth daily., Disp: 90 tablet, Rfl: 3   magnesium oxide (MAG-OX) 400 MG tablet, Take 400 mg by mouth 2 (two) times daily., Disp: , Rfl:    meclizine (ANTIVERT) 25 MG tablet, Take 25 mg by mouth 3 (three) times daily as needed for dizziness., Disp: , Rfl:    metFORMIN (GLUCOPHAGE) 1000 MG tablet, Take 1 tablet (1,000 mg total) by mouth in the morning and at bedtime., Disp: , Rfl:    Multiple Vitamin (MULTI-VITAMIN) tablet, Take 1 tablet by mouth daily., Disp: , Rfl:    omeprazole (PRILOSEC) 20 MG capsule, Take 20 mg by mouth See admin instructions. Every other night, Disp: , Rfl:    rosuvastatin (CRESTOR) 40 MG tablet, Take 40 mg by mouth at bedtime., Disp: , Rfl:    fluticasone furoate-vilanterol (BREO ELLIPTA) 200-25 MCG/INH AEPB, Inhale 1 puff into the lungs daily. (Patient not taking: Reported on 12/20/2020), Disp: 60 each, Rfl: 5      Objective:   Vitals:   12/20/20 1407  BP: 132/70  Pulse: 81  SpO2: 97%  Weight: 179 lb 6.4 oz (81.4 kg)  Height: _0  (1.753 m)    Estimated body mass index is 26.49 kg/m as calculated from the following:   Height as of this encounter: _1  (1.753 m).   Weight as of this encounter: 179 lb 6.4 oz (81.4 kg).  _2 @  [employer]   12/20/20 1407  Weight: 179 lb 6.4 oz (81.4 kg)     Physical Exam  General Appearance:    Alert, cooperative, no distress, appears stated age - yes , Deconditioned looking - no , OBESE  - no, Sitting on Wheelchair -  no   Head:    Normocephalic, without obvious abnormality, atraumatic  Eyes:    PERRL, conjunctiva/corneas clear,  Ears:    Normal TM's and external ear canals, both ears  Nose:   Nares normal, septum midline, mucosa normal, no drainage    or sinus tenderness. OXYGEN ON  - no . Patient is @ ra   Throat:   Lips, mucosa, and tongue normal; teeth and gums normal. Cyanosis on lips - no  Neck:   Supple, symmetrical, trachea midline, no adenopathy;    thyroid:  no enlargement/tenderness/nodules; no carotid   bruit or JVD  Back:     Symmetric, no curvature, ROM normal, no CVA tenderness  Lungs:     Distress - no , Wheeze no, Barrell Chest - no, Purse lip breathing - no, Crackles - yes at base, velcro   Chest Wall:    No tenderness or deformity.    Heart:    Regular rate and rhythm, S1 and S2 normal, no rub   or gallop, Murmur - no  Breast Exam:    NOT DONE  Abdomen:     Soft, non-tender, bowel sounds active all four quadrants,    no masses, no organomegaly. Visceral obesity - no  Genitalia:   NOT DONE  Rectal:   NOT DONE  Extremities:   Extremities - normal, Has Cane - no, Clubbing - no, Edema - no  Pulses:   2+ and symmetric all extremities  Skin:   Stigmata of Connective Tissue Disease - no  Lymph nodes:   Cervical, supraclavicular, and axillary nodes normal  Psychiatric:  Neurologic:   Pleasant - yes, Anxious - no, Flat affect - no  CAm-ICU - neg, Alert and Oriented x 3 - yes, Moves all 4s - yes, Speech - normal, Cognition - intact  Assessment:       ICD-10-CM   1. Interstitial pulmonary disease ([place])  J84.9     2. DOE (dyspnea on exertion)  R06.09     3. Chronic cough  R05.3          Plan:     Patient Instructions     ICD-10-CM   1. Interstitial pulmonary disease ([place])  J84.9     2. DOE (dyspnea on exertion)  R06.09     3. 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 Diagnois is based on age > 92, ex smoker,  progressive disease, probable UIP pattern on CT.  Plan  - do ILD questionnaire and bring it back next time  - do blood work Serum: ESR, ACE, ANA, DS-DNA, RF, anti-CCP,   scl-70, ssA, ssB, Quantiferon Gold , cbc, chemistry, LFT  - start esbriet per protocol   - keeping ofev as 2nd line due to history of diverticulitis    - cMA will refer you to our pharmacist - coontinue breo for your sypomtoms - continue omeprazole  -  Other 6 pillars of ILD /IPF / Pulmonary Fibrosis Managment to be discussed over time - as applicable case by case and on shared decision making  -- pulmonary rehabilitation rehab  - - local support groups - email ptipff_0 .com  - education resource - www.pulmonaryfibrosis.org  - research trials - www.pulmonix.com   Followup  - 6-7 weeks video visit with Dr Chase Caller or app to discuss uptake with esbriet     SIGNATURE    Dr. Brand Males, M.D., F.C.C.P,  Pulmonary and Critical Care Medicine Staff Physician, [place] Director - Interstitial Lung Disease  Program  Pulmonary [place] at [place], [place], [address]  Pager: [phone], If no answer or between  15:00h - 7:00h: call [phone] Telephone: [phone]  3:00 PM 12/20/2020

## 2020-12-20 NOTE — Patient Instructions (Addendum)
ICD-10-CM   1. Interstitial pulmonary disease ([place])  J84.9     2. DOE (dyspnea on exertion)  R06.09     3. 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 Diagnois is based on age > 64, ex smoker, progressive disease, probable UIP pattern on CT.  Plan  - do ILD questionnaire and bring it back next time  - do blood work Serum: ESR, ACE, ANA, DS-DNA, RF, anti-CCP,   scl-70, ssA, ssB, Quantiferon Gold , cbc, chemistry, LFT  - start esbriet per protocol   - keeping ofev as 2nd line due to history of diverticulitis    - cMA will refer you to our pharmacist - coontinue breo for your sypomtoms - continue omeprazole  -  Other 6 pillars of ILD /IPF / Pulmonary Fibrosis Managment to be discussed over time - as applicable case by case and on shared decision making  -- pulmonary rehabilitation rehab  - - local support groups - email ptipff@gmail .com  - education resource - www.pulmonaryfibrosis.org  - research trials - www.pulmonix.com   Followup  - 6-7 weeks video visit with Dr Chase Caller or app to discuss uptake with esbriet

## 2020-12-20 NOTE — Telephone Encounter (Addendum)
Specialty medications are generally covered under [facility] authorization as long as it's on [place] formulary with no copay for patient. Prescription can be faxed or e-scribed to [facility]. Will escribe rx to [facility] pharmacy pending labs drawn today and will fax chart notes.   [facility] Fax: [phone] Phone: [phone], [phone]   Cassie's, case manager with [facility], phone number: [phone]  Chesley Mires, PharmD, MPH, BCPS Clinical Pharmacist (Rheumatology and Pulmonology)

## 2020-12-25 LAB — ANTI-DNA ANTIBODY, DOUBLE-STRANDED: ds DNA Ab: <1 IU/mL

## 2020-12-25 LAB — SJOGRENS SYNDROME-A EXTRACTABLE NUCLEAR ANTIBODY: SSA (Ro) (ENA) Antibody, IgG: <1 AI

## 2020-12-25 LAB — ANTI-SCLERODERMA ANTIBODY: Scleroderma (Scl-70) (ENA) Antibody, IgG: <1 AI

## 2020-12-25 LAB — QUANTIFERON-TB GOLD PLUS
Mitogen-NIL: 9.89 IU/mL
NIL: 0.03 IU/mL
QuantiFERON-TB Gold Plus: NEGATIVE
TB1-NIL: 0.01 IU/mL
TB2-NIL: 0.01 IU/mL

## 2020-12-25 LAB — ANA: Anti Nuclear Antibody (ANA): NEGATIVE

## 2020-12-25 LAB — ANGIOTENSIN CONVERTING ENZYME: Angiotensin-Converting Enzyme: 20 U/L (ref 9–67)

## 2020-12-25 LAB — SJOGRENS SYNDROME-B EXTRACTABLE NUCLEAR ANTIBODY: SSB (La) (ENA) Antibody, IgG: <1 AI

## 2020-12-25 LAB — RHEUMATOID FACTOR: Rheumatoid fact SerPl-aCnc: <14 IU/mL (ref <14)

## 2021-01-02 MED ORDER — PIRFENIDONE 267 MG PO TABS: 801.0000 mg | ORAL_TABLET | Freq: Three times a day (TID) | ORAL | 1 refills | Status: DC

## 2021-01-02 MED ORDER — PIRFENIDONE 267 MG PO TABS: ORAL_TABLET | ORAL | 0 refills | Status: DC

## 2021-01-02 NOTE — Telephone Encounter (Signed)
Rx for Esbriet 267 mg 1 tab three times daily x 7 days, then 2 tabs three times daily x7 days, then 3 tabs three times daily thereafter sent to [facility]   Chart notes and HRCT sent to [facility].  LFTs and kidney function labs drawn on 12/20/20 wnl   Fax: [phone] Phone: [phone], [phone]  Chesley Mires, PharmD, MPH, BCPS Clinical Pharmacist (Rheumatology and Pulmonology)

## 2021-01-08 ENCOUNTER — Ambulatory Visit (INDEPENDENT_AMBULATORY_CARE_PROVIDER_SITE_OTHER): Payer: Medicare HMO

## 2021-01-08 DIAGNOSIS — I442 Atrioventricular block, complete: Secondary | ICD-10-CM

## 2021-01-08 LAB — CUP PACEART REMOTE DEVICE CHECK
Battery Remaining Longevity: 150 mo
Battery Remaining Percentage: 100 %
Brady Statistic RA Percent Paced: 2 %
Brady Statistic RV Percent Paced: 100 %
Date Time Interrogation Session: 20221206050500
Lead Channel Impedance Value: 612 Ohm
Lead Channel Impedance Value: 823 Ohm
Lead Channel Pacing Threshold Amplitude: 0.5 V
Lead Channel Pacing Threshold Amplitude: 0.7 V
Lead Channel Pacing Threshold Pulse Width: 0.4 ms
Lead Channel Pacing Threshold Pulse Width: 0.4 ms
Lead Channel Setting Pacing Amplitude: 2 V
Lead Channel Setting Pacing Amplitude: 2.5 V
Lead Channel Setting Pacing Pulse Width: 0.4 ms
Lead Channel Setting Sensing Sensitivity: 2.5 mV
Pulse Gen Serial Number: 962270

## 2021-01-16 NOTE — Progress Notes (Signed)
Remote pacemaker transmission.   

## 2021-01-21 NOTE — Telephone Encounter (Signed)
ATC patient regarding Esbriet/pirfenidone status. Preferred number is work number - phone rang and went to busy tone. Called mobile number - left VM requesting return call  Chesley Mires, PharmD, MPH, BCPS Clinical Pharmacist (Rheumatology and Pulmonology)

## 2021-02-08 ENCOUNTER — Telehealth (INDEPENDENT_AMBULATORY_CARE_PROVIDER_SITE_OTHER): Payer: No Typology Code available for payment source | Admitting: Internal Medicine

## 2021-02-08 ENCOUNTER — Other Ambulatory Visit: Payer: Self-pay

## 2021-02-08 DIAGNOSIS — R053 Chronic cough: Secondary | ICD-10-CM

## 2021-02-08 DIAGNOSIS — J84112 Idiopathic pulmonary fibrosis: Secondary | ICD-10-CM

## 2021-02-08 MED ORDER — BENZONATATE 200 MG PO CAPS: 200.0000 mg | ORAL_CAPSULE | Freq: Three times a day (TID) | ORAL | 2 refills | Status: DC | PRN

## 2021-02-08 NOTE — Patient Instructions (Addendum)
ICD-10-CM   1. IPF (idiopathic pulmonary fibrosis) ([place])  J84.112     2. Chronic cough  R05.3        Continue esbriet per protocol Check LFT in 4-6 weeks  For cough  - can try OTC robitussin  - start teassolon cough perles 200mg  three times daily as needed - [facility]   Followup  -4-6 weeks face to face ith DR Chase Caller - 03/19/21 - 11.30am  - simple walk test and symptom score  - bring ILD questionnaire back with you

## 2021-02-08 NOTE — Progress Notes (Signed)
OV august 2022 for SOB by [facility]  Subjective:   [name] GENDER: male DOB: 06-Jan-1946, MRN: [id number]  Chief Complaint  Patient presents with   Consult    Pt is being referred due to chronic cough.  Pt states that he does have complaints of chest tightness and SOB with activities.      PMH DMII, high cholesterol. He has recurrent issues with chest tightness. Dry cough, usually non-productive. Occasionally in the morning her has productive cough. He has gerd and takes a PPI. If he does any activity it makes the episodes worse. Hot shower and activity makes it worse. No seasonal changes that he notices. He does have sinus issues at times. Retired at a Programme researcher, broadcasting/film/video. Was in [employer], [place] 1966 - 1971. No pets in the house. PFTS at [facility] ratio normal, FEV1 2L, normal dlco, normal tlc. He stays active and still working part time. He has used albuterol in the past prior to getting his pacemaker.   CT scan of the chest was completed in November 2021 which revealed lower lobe honeycombing and early evidence of interstitial lung disease. xxxxxx  OV 11/23/2020: here to day for follow up. Unfortunately he did not have his CT Chest complete. It was set up through the [place] and he was not prepared to pay for it. His cough is better. His chest tightness is better as well.  Has been using his inhalers.  OV 12/20/2020 -transfer of care to the ILD center to Dr. Marchelle Gearing.  Referred by Dr. Audie Box  Subjective:  [name], male , DOB: 1945-04-29 , age 76 y.o. , MRN: [id number] , ADDRESS: [address] [place] [address] PCP [facility] Patient Care Team: [facility] as PCP - General Rennis Golden Lisette Abu, MD as PCP - Cardiology (Cardiology)  This Provider for this visit: Treatment Team:  Attending Provider: Kalman Shan, MD    12/20/2020 -   Chief Complaint  Patient presents with   Follow-up    Chest  tightness     [name] 76 y.o. -seen in August 2022 in October 2022 by Dr. Tonia Brooms.  He is here with his wife.  He is a Cytogeneticist. Dr Shelle Iron at the [facility] in the area is retired.  Therefore he is seeing [place].  He reports insidious onset of shortness of breath at least a year.  This started around the time he had his pacemaker.  The pacemaker did get rid of his syncope but not the shortness of breath.  Is present on exertion relieved by rest.  Recently Dr. Tonia Brooms started [facility] and this is helped his chest tightness on exertion but he still continues have significant shortness of breath with exertion and cough.  Documented below.  He is aware that he has pulmonary fibrosis.  His symptoms are somewhat progressive.  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  Shared with the family his results.  Review of the records indicate that he has not had serology so far.  An ILD questionnaire packet was mailed out to him but he has not received it yet so we do not have the data.  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  His other medications include Breo and omeprazole.  He has a history of diverticulitis.  No coronary artery disease but he has a pacemaker.  He is not on anticoagulation he is on  aspirin only.  His walking desaturation test shows adequate oxygenation with minimal pulse ox drop.   CT Chest data - HRCT 11/27/20  Study Result  Narrative & Impression  CLINICAL DATA:  76 year old male with history of interstitial lung disease. Follow-up study.   EXAM: CT CHEST WITHOUT CONTRAST   TECHNIQUE: Multidetector CT imaging of the chest was performed following the standard protocol without intravenous contrast. High resolution imaging of the lungs, as well as inspiratory and expiratory imaging, was performed.   COMPARISON:  Chest CT 12/19/2019.   FINDINGS: Cardiovascular: Heart size is normal. There is no significant pericardial fluid,  thickening or pericardial calcification. There is aortic atherosclerosis, as well as atherosclerosis of the great vessels of the mediastinum and the coronary arteries, including calcified atherosclerotic plaque in the left main, left anterior descending, left circumflex and right coronary arteries. Severe calcifications of the aortic valve and mitral annulus. Left-sided pacemaker device in place with lead tips terminating in the right atrial appendage and right ventricular apex.   Mediastinum/Nodes: No pathologically enlarged mediastinal or hilar lymph nodes. Please note that accurate exclusion of hilar adenopathy is limited on noncontrast CT scans. Esophagus is unremarkable in appearance. No axillary lymphadenopathy.   Lungs/Pleura: High-resolution images demonstrate widespread but patchy areas of peripheral predominant ground-glass attenuation, septal thickening, subpleural reticulation, cylindrical bronchiectasis and peripheral bronchiolectasis. Findings appear progressive compared to the prior study. Findings have a definitive craniocaudal gradient. No frank honeycombing is confidently identified at this time. Inspiratory and expiratory imaging demonstrates some very mild air trapping in the lung bases indicative of mild small airways disease. No acute consolidative airspace disease. No pleural effusions. No suspicious appearing pulmonary nodules or masses are noted.   Upper Abdomen: Aortic atherosclerosis.   Musculoskeletal: Orthopedic fixation hardware in the lower cervical spine incidentally noted. There are no aggressive appearing lytic or blastic lesions noted in the visualized portions of the skeleton.   IMPRESSION: 1. Progressively worsening interstitial lung disease, 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 2. Aortic atherosclerosis, in addition to left main and 3 vessel coronary artery disease.  Assessment for potential risk factor modification, dietary therapy or pharmacologic therapy may be warranted, if clinically indicated. 3. There are calcifications of the aortic valve and mitral annulus. Echocardiographic correlation for evaluation of potential valvular dysfunction may be warranted if clinically indicated.   Aortic Atherosclerosis (ICD10-I70.0).     Electronically Signed   By: Trudie Reed M.D.   On: 11/28/2020 12:46    No results found.  Cardoiac cath  oct 2021 Mid RCA lesion is 25% stenosed. Prox Cx to Mid Cx lesion is 25% stenosed. Prox LAD to Mid LAD lesion is 25% stenosed. The left ventricular systolic function is normal. LV end diastolic pressure is normal. The left ventricular ejection fraction is 55-65% by visual estimate. There is no aortic valve stenosis.   Nonobstructive CAD.  Continue preventive therapy  PFT  No flowsheet data found.   Latest Reference Range & Units 11/19/19 12:17  Creatinine 0.61 - 1.24 mg/dL 6.55    Latest Reference Range & Units 11/19/19 12:17  Hemoglobin 13.0 - 17.0 g/dL 37.4     OV 09/04/7076  Subjective:  [name], male , DOB: Jun 28, 1945 , age 53 y.o. , MRN: [id number] , ADDRESS: [address] [place] [address] PCP [facility], [facility] Patient Care Team: [facility] as PCP - General Rennis Golden Lisette Abu, MD as PCP - Cardiology (  Cardiology)  This Provider for this visit: Treatment Team:  Attending Provider: Kalman Shan, MD  Type of visit:Video Circumstance: COVID-19 national emergency Identification of patient Scott Price with 06/18/1945 and MRN [id number] - 2 person identifier Risks: Risks, benefits, limitations of telephone visit explained. Patient understood and verbalized agreement to proceed Anyone else on call: his wife on side Patient location: [address] [place] [address] This provider location: [address], [address]; [place]; [place] [address].  [facility]. [phone]    02/08/2021 - clinical IPF followup - to assess esbriet uptake    [name] 76 y.o. - only on 2nd week of esbreit. On 2 pills tid. Sunday 02/10/21 ends 2pills tid. On Monday 02/11/21 will go to 3 pills tid. No side effects. Spacing 5-6h apart . Does not wear sunscreen bu wears hat and long sleeve clothes. Does have sunscren. Reviewd serology and cbc/chmeistry - these are normal  However, reports chest congestion. Hacks and coughs. Sputum +. Says it is baseline few months ago. Rates  it as 4-5 of 5.Does not know sputum color - but thinks it is clear. Not wheezing. Clears throat. fEels he says he cannot take prednisone - even 5 days - drives him hyperglucemic 400s     SYMPTOM SCALE - ILD 12/20/2020  Current weight   O2 use ra  Shortness of Breath 0 -> 5 scale with 5 being worst (score 6 If unable to do)  At rest 2  Simple tasks - showers, clothes change, eating, shaving 3  Household (dishes, doing bed, laundry) 4  Shopping 3  Walking level at own pace 3  Walking up Stairs Does not do  Total (30-36) Dyspnea Score 15  How bad is your cough? 4  How bad is your fatigue 4  How bad is nausea 0  How bad is vomiting?  0  How bad is diarrhea? 1  How bad is anxiety? 5  How bad is depression 0  Any chronic pain - if so where and how bad 0      Simple office walk 185 feet x  3 laps goal with forehead probe 12/20/2020    O2 used ra   Number laps completed 3   Comments about pace 3   Resting Pulse Ox/HR 99% and 78/min   Final Pulse Ox/HR 97% and 96/min   Desaturated </= 88% no   Desaturated <= 3% points no   Got Tachycardic >/= 90/min yes   Symptoms at end of test Mild dyspnea   Miscellaneous comments x    AUTOIMMUNE 12/20/20   Latest Reference Range & Units 11/19/19 12:17 12/14/19 09:41 04/27/20 15:59 12/20/20 15:13  Sed Rate 0 - 20 mm/hr    7  Glucose 70 - 99 mg/dL 323 (H) 557 (H) 322 (H) 166 (H)  Anti Nuclear Antibody (ANA)  NEGATIVE     NEGATIVE  Angiotensin-Converting Enzyme 9 - 67 U/L    20  ds DNA Ab IU/mL    <1  RA Latex Turbid. <14 IU/mL    <14  SSA (Ro) (ENA) Antibody, IgG <1.0 NEG AI    <1.0 NEG  SSB (La) (ENA) Antibody, IgG <1.0 NEG AI    <1.0 NEG  Scleroderma (Scl-70) (ENA) Antibody, IgG <1.0 NEG AI    <1.0 NEG  QUANTIFERON-TB GOLD PLUS     Rpt  (H): Data is abnormally high Rpt: View report in Results Review for more information   PFT  No  flowsheet data found.     has a past medical history of Diabetes mellitus without complication (HCC) and High cholesterol.   reports that he quit smoking about 23 years ago. His smoking use included cigarettes. He has never used smokeless tobacco.  Past Surgical History:  Procedure Laterality Date   BACK SURGERY     LEFT HEART CATH AND CORONARY ANGIOGRAPHY N/A 11/22/2019   Procedure: LEFT HEART CATH AND CORONARY ANGIOGRAPHY;  Surgeon: Corky CraftsVaranasi, Jayadeep S, MD;  [facility];  Service: Cardiovascular;  Laterality: N/A;   NECK SURGERY     PACEMAKER IMPLANT N/A 12/26/2019   Procedure: PACEMAKER IMPLANT;  Surgeon: Marinus Mawaylor, Gregg W, MD;  Location: [facility] INVAS[facility] CV LAB;  Service: Cardiovascular;  Laterality: N/A;    Allergies  Allergen Reactions   Doxycycline Shortness Of Breath and Rash   Lisinopril Cough   Amoxicillin Rash   Lasix [Furosemide] Rash   Prednisone     High blood sugar   Sulfa Antibiotics Rash    Headache/ Chest tightness/ stiff neck    Immunization History  Administered Date(s) Administered   Influenza Split 11/22/2012   Influenza, High Dose Seasonal PF 01/09/2020, 11/15/2020   Moderna Sars-Covid-2 Vaccination 04/11/2019, 05/09/2019, 01/17/2020, 06/26/2020   Pneumococcal Polysaccharide-23 09/24/2020    Family History  Problem Relation Age of Onset   CAD Brother    CAD Brother      Current Outpatient Medications:    albuterol (VENTOLIN HFA) 108 (90 Base) MCG/ACT inhaler, Inhale 2 puffs into the lungs every 6  (six) hours as needed for wheezing or shortness of breath., Disp: 8 g, Rfl: 5   aspirin 81 MG EC tablet, Take 81 mg by mouth daily. , Disp: , Rfl:    Cholecalciferol (VITAMIN D3) 25 MCG (1000 UT) CAPS, Take 1,000 Units by mouth daily., Disp: , Rfl:    cyanocobalamin 1000 MCG tablet, Take 1,000 mcg by mouth daily., Disp: , Rfl:    empagliflozin (JARDIANCE) 10 MG TABS tablet, Take 5 mg by mouth in the morning and at bedtime., Disp: , Rfl:    ferrous sulfate 325 (65 FE) MG tablet, Take 325 mg by mouth daily with breakfast., Disp: , Rfl:    fluticasone furoate-vilanterol (BREO ELLIPTA) 100-25 MCG/ACT AEPB, Inhale 1 puff into the lungs daily., Disp: 60 each, Rfl: 6   fluticasone furoate-vilanterol (BREO ELLIPTA) 200-25 MCG/INH AEPB, Inhale 1 puff into the lungs daily. (Patient not taking: Reported on 12/20/2020), Disp: 60 each, Rfl: 5   glipiZIDE (GLUCOTROL) 10 MG tablet, Take 5 mg by mouth 2 (two) times daily before a meal., Disp: , Rfl:    losartan (COZAAR) 25 MG tablet, Take 1 tablet (25 mg total) by mouth daily., Disp: 90 tablet, Rfl: 3   magnesium oxide (MAG-OX) 400 MG tablet, Take 400 mg by mouth 2 (two) times daily., Disp: , Rfl:    meclizine (ANTIVERT) 25 MG tablet, Take 25 mg by mouth 3 (three) times daily as needed for dizziness., Disp: , Rfl:    metFORMIN (GLUCOPHAGE) 1000 MG tablet, Take 1 tablet (1,000 mg total) by mouth in the morning and at bedtime., Disp: , Rfl:    Multiple Vitamin (MULTI-VITAMIN) tablet, Take 1 tablet by mouth daily., Disp: , Rfl:    omeprazole (PRILOSEC) 20 MG capsule, Take 20 mg by mouth See admin instructions. Every other night, Disp: , Rfl:    Pirfenidone (ESBRIET) 267 MG TABS, Take 1 tab three times daily for 7 days, then 2 tabs three times daily  for 7 days, then 3 tabs three times daily thereafter., Disp: 270 tablet, Rfl: 0   Pirfenidone (ESBRIET) 267 MG TABS, Take 3 tablets (801 mg total) by mouth 3 (three) times daily with meals., Disp: 270 tablet, Rfl: 1    rosuvastatin (CRESTOR) 40 MG tablet, Take 40 mg by mouth at bedtime., Disp: , Rfl:       Objective:   There were no vitals filed for this visit.  Estimated body mass index is 26.49 kg/m as calculated from the following:   Height as of 12/20/20: 5\' 9"  (1.753 m).   Weight as of 12/20/20: 179 lb 6.4 oz (81.4 kg).  @WEIGHTCHANGE @  There were no vitals filed for this visit.   Physical Exam Looks well but is coughing and clearing his throat and coughing/clearing       Assessment:       ICD-10-CM   1. IPF (idiopathic pulmonary fibrosis) (HCC)  J84.112     2. Chronic cough  R05.3          Plan:     Patient Instructions     ICD-10-CM   1. IPF (idiopathic pulmonary fibrosis) (HCC)  J84.112     2. Chronic cough  R05.3        Continue esbriet per protocol Check LFT in 4-6 weeks  For cough  - can try OTC robitussin  - start teassolon cough perles 200mg  three times daily as needed   Followup  -4-6 weeks face to face ith DR Marchelle Gearingamaswamy - 03/19/21 - 11.30am  - simple walk test and symptom score  - bring ILD questionnaire back with you    SIGNATURE    Dr. Kalman ShanMurali Kristina Bertone, M.D., F.C.C.P,  Pulmonary and Critical Care Medicine Staff Physician, [facility] Director - Interstitial Lung Disease  Program  Pulmonary Fibrosis [facility] [place], [place], [id number]  Pager: [phone], If no answer or between  15:00h - 7:00h: call [phone] Telephone: [phone]  1:55 PM 02/08/2021

## 2021-03-19 ENCOUNTER — Other Ambulatory Visit: Payer: Self-pay

## 2021-03-19 ENCOUNTER — Encounter: Payer: Self-pay | Admitting: Internal Medicine

## 2021-03-19 ENCOUNTER — Telehealth: Payer: Self-pay | Admitting: Internal Medicine

## 2021-03-19 ENCOUNTER — Ambulatory Visit (INDEPENDENT_AMBULATORY_CARE_PROVIDER_SITE_OTHER): Payer: No Typology Code available for payment source | Admitting: Internal Medicine

## 2021-03-19 VITALS — BP 114/68 | HR 79 | Temp 97.9°F | Ht 69.0 in | Wt 183.0 lb

## 2021-03-19 DIAGNOSIS — Z5181 Encounter for therapeutic drug level monitoring: Secondary | ICD-10-CM

## 2021-03-19 DIAGNOSIS — K219 Gastro-esophageal reflux disease without esophagitis: Secondary | ICD-10-CM

## 2021-03-19 DIAGNOSIS — J84112 Idiopathic pulmonary fibrosis: Secondary | ICD-10-CM | POA: Diagnosis not present

## 2021-03-19 DIAGNOSIS — R053 Chronic cough: Secondary | ICD-10-CM

## 2021-03-19 DIAGNOSIS — R0982 Postnasal drip: Secondary | ICD-10-CM

## 2021-03-19 NOTE — Telephone Encounter (Signed)
Rx for pirfenidone was sent on 01/02/21 for 6 month supply. Patient should be able to order refill from [facility].   Will send refill once labs from today result  Chesley Mires, PharmD, MPH, BCPS Clinical Pharmacist (Rheumatology and Pulmonology)

## 2021-03-19 NOTE — Progress Notes (Signed)
IOV august 2022 for SOB by Clinic, Scott Price  Subjective:   [name]: male DOB: February 02, 1946, MRN: [id number]  Chief Complaint  Patient presents with   Consult    Pt is being referred due to chronic cough.  Pt states that he does have complaints of chest tightness and SOB with activities.      PMH DMII, high cholesterol. He has recurrent issues with chest tightness. Dry cough, usually non-productive. Occasionally in the morning her has productive cough. He has gerd and takes a PPI. If he does any activity it makes the episodes worse. Hot shower and activity makes it worse. No seasonal changes that he notices. He does have sinus issues at times. Retired at a Agricultural consultant. Was in [place], [place] 1966 - 1971. No pets in the house. PFTS at [facility] ratio normal, FEV1 2L, normal dlco, normal tlc. He stays active and still working part time. He has used albuterol in the past prior to getting his pacemaker.   CT scan of the chest was completed in November 2021 which revealed lower lobe honeycombing and early evidence of interstitial lung disease. xxxxxx  OV 11/23/2020: here to day for follow up. Unfortunately he did not have his CT Chest complete. It was set up through the [place] and he was not prepared to pay for it. His cough is better. His chest tightness is better as well.  Has been using his inhalers.  OV 12/20/2020 -transfer of care to the ILD center to Scott Price.  Referred by Scott Price  Subjective:  [name], male , DOB: 07-17-1945 , age 76 y.o. , MRN: [id number] , ADDRESS: [place] [address] PCP Clinic, Scott Price Patient Care Team: Clinic, Scott Price as PCP - General Scott Pickett Nadean Corwin, MD as PCP - Cardiology (Cardiology)  This Provider for this visit: Treatment Team:  Attending Provider: Brand Males, MD    12/20/2020 -   Chief Complaint  Patient presents with   Follow-up    Chest  tightness     [name] 76 y.o. -seen in August 2022 in October 2022 by Scott Price.  He is here with his wife.  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  Therefore he is seeing [place].  He reports insidious onset of shortness of breath at least a year.  This started around the time he had his pacemaker.  The pacemaker did get rid of his syncope but not the shortness of breath.  Is present on exertion relieved by rest.  Recently Scott Price started [facility] and this is helped his chest tightness on exertion but he still continues have significant shortness of breath with exertion and cough.  Documented below.  He is aware that he has pulmonary fibrosis.  His symptoms are somewhat progressive.  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  Shared with the family his results.  Review of the records indicate that he has not had serology so far.  An ILD questionnaire packet was mailed out to him but he has not received it yet so we do not have the data.  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  His other medications include Breo and omeprazole.  He has a history of diverticulitis.  No coronary artery disease but he has a pacemaker.  He is not on anticoagulation he  is on aspirin only.  His walking desaturation test shows adequate oxygenation with minimal pulse ox drop.   [place] Integrated Comprehensive ILD Questionnaire -dictated on 03/19/2021  Symptoms:  x    Past Medical History :  -Denies any asthma or COPD or heart failure.  Denies collagen vascular disease..  Denies sleep apnea -Does have acid reflux -Does have diabetes.  Intolerant to prednisone.  Makes him very hyperglycemic -Intolerant to gabapentin -Never had COVID   ROS:  -Does have exertional fatigue -10 pound weight loss prior to the November 2022 consult -Dry eyes present -Does have occasional acid reflux which is fairly well controlled with  omeprazole  FAMILY HISTORY of LUNG DISEASE:  -Mom had asthma  PERSONAL EXPOSURE HISTORY:  -Smoke cigarettes stop smoking 23 years ago.  Never did vaping in the past smoked some amount of marijuana but not much.  No cocaine use no intravenous drug use  HOME  EXPOSURE and HOBBY DETAILS :  -[place]ives in the urban house [place] 1997.  The house was 77 years old.  No dampness no mold or mildew.  Does not use CPAP.  Extensive organic antigen exposure history is negative  OCCUPATIONAL HISTORY (122 questions) : -He is a veteran but no agent orange exposure.  Has done gardening.  Has done woodwork.  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  Has done a garage repair.  Has done furniture work.  Has done some car Field seismologist work.  Has done machine operating.  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  Intolerant to prednisone. -For postnasal drip has tried nasal spray saline and steroid nasal spray and it does not work  INVESTIGATIONS: -Below   CT Chest data - HRCT 11/27/20  Study Result  Narrative & Impression  CLINICAL DATA:  76 year old male with history of interstitial lung disease. Follow-up study.   EXAM: CT CHEST WITHOUT CONTRAST   TECHNIQUE: Multidetector CT imaging of the chest was performed following the standard protocol without intravenous contrast. High resolution imaging of the lungs, as well as inspiratory and expiratory imaging, was performed.   COMPARISON:  Chest CT 12/19/2019.   FINDINGS: Cardiovascular: Heart size is normal. There is no significant pericardial fluid, thickening or pericardial calcification. There is aortic atherosclerosis, as well as atherosclerosis of the great vessels of the mediastinum and the coronary arteries, including calcified atherosclerotic plaque in the left main, left anterior descending, left circumflex and right coronary  arteries. Severe calcifications of the aortic valve and mitral annulus. Left-sided pacemaker device in place with lead tips terminating in the right atrial appendage and right ventricular apex.   Mediastinum/Nodes: No pathologically enlarged mediastinal or hilar lymph nodes. Please note that accurate exclusion of hilar adenopathy is limited on noncontrast CT scans. Esophagus is unremarkable in appearance. No axillary lymphadenopathy.   Lungs/Pleura: High-resolution images demonstrate widespread but patchy areas of peripheral predominant ground-glass attenuation, septal thickening, subpleural reticulation, cylindrical bronchiectasis and peripheral bronchiolectasis. Findings appear progressive compared to the prior study. Findings have a definitive craniocaudal gradient. No frank honeycombing is confidently identified at this time. Inspiratory and expiratory imaging demonstrates some very mild air trapping in the lung bases indicative of mild small airways disease. No acute consolidative airspace disease. No pleural effusions. No suspicious appearing pulmonary nodules or masses are noted.   Upper Abdomen: Aortic atherosclerosis.   Musculoskeletal: Orthopedic fixation hardware in the lower cervical spine incidentally noted.  There are no aggressive appearing lytic or blastic lesions noted in the visualized portions of the skeleton.   IMPRESSION: 1. Progressively worsening interstitial lung disease, 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 2. Aortic atherosclerosis, in addition to left main and 3 vessel coronary artery disease. Assessment for potential risk factor modification, dietary therapy or pharmacologic therapy may be warranted, if clinically indicated. 3. There are calcifications of the aortic valve and mitral annulus. Echocardiographic correlation for evaluation of potential valvular dysfunction may be  warranted if clinically indicated.   Aortic Atherosclerosis (ICD10-I70.0).     Electronically Signed   By: Vinnie Langton M.D.   On: 11/28/2020 12:46    No results found.  Cardoiac cath  oct 2021 Mid RCA lesion is 25% stenosed. Prox Cx to Mid Cx lesion is 25% stenosed. Prox LAD to Mid LAD lesion is 25% stenosed. The left ventricular systolic function is normal. LV end diastolic pressure is normal. The left ventricular ejection fraction is 55-65% by visual estimate. There is no aortic valve stenosis.   Nonobstructive CAD.  Continue preventive therapy  PFT  No flowsheet data found.   Latest Reference Range & Units 11/19/19 12:17  Creatinine 0.61 - 1.24 mg/dL 1.00    Latest Reference Range & Units 11/19/19 12:17  Hemoglobin 13.0 - 17.0 g/dL 14.1       OV 02/08/2021  Subjective:  [name], male , DOB: 1945-08-10 , age 80 y.o. , MRN: [id number] , ADDRESS: [place] [address] PCP Clinic, Scott Price Patient Care Team: Clinic, Scott Price as PCP - General Scott Pickett Nadean Corwin, MD as PCP - Cardiology (Cardiology)  This Provider for this visit: Treatment Team:  Attending Provider: Brand Males, MD  Type of visit:Video Circumstance: COVID-19 national emergency Identification of patient ARONDE PREUSS with 04-Jan-1946 and MRN [id number] - 2 person identifier Risks: Risks, benefits, limitations of telephone visit explained. Patient understood and verbalized agreement to proceed Anyone else on call: his wife on side Patient location: [place] [address] This provider location: [address]; [place]; [place] [address]. [place] [facility]. [phone]    02/08/2021 - clinical IPF followup - to assess esbriet uptake    [name] 76 y.o. - only on 2nd week of esbreit. On 2 pills tid. Sunday 02/10/21 ends 2pills tid. On Monday 02/11/21 will go to 3 pills tid. No side effects. Spacing 5-6h apart .  Does not wear sunscreen bu wears hat and long sleeve clothes. Does have sunscren. Reviewd serology and cbc/chmeistry - these are normal  However, reports chest congestion. Hacks and coughs. Sputum +. Says it is baseline few months ago. Rates  it as 4-5 of 5.Does not know sputum color - but thinks it is clear. Not wheezing. Clears throat. fEels he says he cannot take prednisone - even 5 days - drives him hyperglucemic 400s  AUTOIMMUNE 12/20/20   Latest Reference Range & Units 11/19/19 12:17 12/14/19 09:41 04/27/20 15:59 12/20/20 15:13  Sed Rate 0 - 20 mm/hr    7  Glucose 70 - 99 mg/dL 156 (H) 207 (H) 157 (H) 166 (H)  Anti Nuclear Antibody (ANA) NEGATIVE     NEGATIVE  Angiotensin-Converting Enzyme 9 - 67 U/L    20  ds DNA Ab IU/mL    <1  RA Latex Turbid. <14 IU/mL    <14  SSA (Ro) (ENA)  Antibody, IgG <1.0 NEG AI    <1.0 NEG  SSB (La) (ENA) Antibody, IgG <1.0 NEG AI    <1.0 NEG  Scleroderma (Scl-70) (ENA) Antibody, IgG <1.0 NEG AI    <1.0 NEG  QUANTIFERON-TB GOLD PLUS     Rpt  (H): Data is abnormally high Rpt: View report in Results Review for more information   PFT  No flowsheet data found  OV 03/19/2021  Subjective:  [name], male , DOB: 01-16-1946 , age 32 y.o. , MRN: [id number] , ADDRESS: [address] [place] [address] PCP Clinic, Scott Price Patient Care Team: Clinic, Scott Price as PCP - General Scott Pickett Nadean Corwin, MD as PCP - Cardiology (Cardiology)  This Provider for this visit: Treatment Team:  Attending Provider: Brand Males, MD    03/19/2021 -   Chief Complaint  Patient presents with   Follow-up    Pt states he has been doing okay since last visit. States he does still become SOB with exertion and still has complaints of coughing that is worse in the mornings.   Diagnois is based on age > 66, ex smoker, progressive disease, probable UIP pattern on CT., negative serolgy and ILD qquewtionaire and acid reflux  -Start pirfenidone  around Christmas 2022 -Significant out of proportion cough  [name] 76 y.o. -returns for follow-up.  I reviewed his ILD questionnaire and his exposures consistent with IPF.  Currently he is on pirfenidone.  He is now on 3 pills 3 times daily for at least a month.  He says ever since going to 3 pills 3 times daily he has occasional mild acid reflux which his wife also test.  He feels a heartburn.  It is mild and tolerable.  He does take omeprazole but he is taking it with food as opposed to empty stomach.  There is no nausea.  No vomiting no diarrhea.  He feels his weight is stable.  Overall he is pleased with his Esbriet tolerance.  His chronic symptoms of shortness of breath and cough are stable although the cough is the most significant of symptoms.  He rates it as a 3 out of 5.  It is worse early in the morning than the rest of the day.  There is clear sputum.  Last visit I prescribed Robitussin which is helping him.  I also suggested he take Tessalon but he he is unable to afford a $66 co-pay.  We discussed gabapentin but this makes him sleepy from a previous neuropathic experience.  We discussed prednisone but this makes him hyperglycemic he does not want to do it.  We discussed opioids but he does not want to do that either.  He is on chronic Afrin nasal spray.  We discussed taking saline nasal spray or nasal steroids for his sinus drainage but he feels those things do not help.  Nevertheless he is willing to try it.  He is going to have a liver function test today  We discussed participation in clinical trials and he is interested in the future.   1. Scientific Purpose  Clinical research is designed to produce generalizable knowledge and to answer questions about the safety and efficacy of intervention(s) under study in order to determine whether or not they may be useful for the care of future patients.  2. Study Procedures  Participation in a trial may involve procedures or tests,  in addition to the intervention(s) under study, that are intended only or primarily  to generate scientific knowledge and that are otherwise not necessary for patient care.   3. Uncertainty  For intervention(s) under study in clinical research, there often is less knowledge and more uncertainty about the risks and benefits to a population of trial participants than there is when a doctor offers a patient standard interventions.   4. Adherence to Protocol  Administration of the intervention(s) under study is typically based on a strict protocol with defined dose, scheduling, and use or avoidance of concurrent medications, compared to administration of standard interventions.  5. Clinician as Investigator  Clinicians who are in health care settings provide treatment; in a clinical trial setting, they are also investigating safety and efficacy of an intervention. In otherwise your doctor or nurse practitioner can be wearing 2 hats - one as care giver another as Oceanographer  6. Patient as Engineer, agricultural Subject  Patients participating in research trials are research subjects or volunteers. In other words participating in research is 100% voluntary and at one's own free weill. The decision to participate or not participate will NOT affect patient care and the doctor-patient relationship in any way      SYMPTOM SCALE - ILD 12/20/2020  Current weight   O2 use ra  Shortness of Breath 0 -> 5 scale with 5 being worst (score 6 If unable to do)  At rest 2  Simple tasks - showers, clothes change, eating, shaving 3  Household (dishes, doing bed, laundry) 4  Shopping 3  Walking level at own pace 3  Walking up Stairs Does not do  Total (30-36) Dyspnea Score 15  How bad is your cough? 4  How bad is your fatigue 4  How bad is nausea 0  How bad is vomiting?  0  How bad is diarrhea? 1  How bad is anxiety? 5  How bad is depression 0  Any chronic pain - if so where and how bad 0       Simple office walk 185 feet x  3 laps goal with forehead probe 12/20/2020  03/19/2021   O2 used ra ra  Number laps completed 3 3  Comments about pace 3   Resting Pulse Ox/HR 99% and 78/min 100 and 79  Final Pulse Ox/HR 97% and 96/min 100% and 90  Desaturated </= 88% no no  Desaturated <= 3% points no no  Got Tachycardic >/= 90/min yes yes  Symptoms at end of test Mild dyspnea Mild dyspnea  Miscellaneous comments x x    CT Chest data  No results found.    PFT  No flowsheet data found.     has a past medical history of Diabetes mellitus without complication (HCC) and High cholesterol.   reports that he quit smoking about 23 years ago. His smoking use included cigarettes. He has never used smokeless tobacco.  Past Surgical History:  Procedure Laterality Date   BACK SURGERY     LEFT HEART CATH AND CORONARY ANGIOGRAPHY N/A 11/22/2019   Procedure: LEFT HEART CATH AND CORONARY ANGIOGRAPHY;  Surgeon: Corky Crafts, MD;  Location: [facility] INVASIVE CV LAB;  Service: Cardiovascular;  Laterality: N/A;   NECK SURGERY     PACEMAKER IMPLANT N/A 12/26/2019   Procedure: PACEMAKER IMPLANT;  Surgeon: Marinus Maw, MD;  Location: [facility] INVASIVE CV LAB;  Service: Cardiovascular;  Laterality: N/A;    Allergies  Allergen Reactions   Doxycycline Shortness Of Breath and Rash   Lisinopril Cough   Amoxicillin Rash   Lasix [Furosemide]  Rash   Prednisone     High blood sugar   Sulfa Antibiotics Rash    Headache/ Chest tightness/ stiff neck    Immunization History  Administered Date(s) Administered   Influenza Split 11/22/2012   Influenza, High Dose Seasonal PF 01/09/2020, 11/15/2020   Moderna Sars-Covid-2 Vaccination 04/11/2019, 05/09/2019, 01/17/2020, 06/26/2020   Pneumococcal Polysaccharide-23 09/24/2020    Family History  Problem Relation Age of Onset   CAD Brother    CAD Brother      Current Outpatient Medications:    albuterol (VENTOLIN HFA) 108 (90 Base)  MCG/ACT inhaler, Inhale 2 puffs into the lungs every 6 (six) hours as needed for wheezing or shortness of breath., Disp: 8 g, Rfl: 5   aspirin 81 MG EC tablet, Take 81 mg by mouth daily. , Disp: , Rfl:    Cholecalciferol (VITAMIN D3) 25 MCG (1000 UT) CAPS, Take 1,000 Units by mouth daily., Disp: , Rfl:    cyanocobalamin 1000 MCG tablet, Take 1,000 mcg by mouth daily., Disp: , Rfl:    empagliflozin (JARDIANCE) 10 MG TABS tablet, Take 5 mg by mouth in the morning and at bedtime., Disp: , Rfl:    ferrous sulfate 325 (65 FE) MG tablet, Take 325 mg by mouth daily with breakfast., Disp: , Rfl:    fluticasone furoate-vilanterol (BREO ELLIPTA) 100-25 MCG/ACT AEPB, Inhale 1 puff into the lungs daily., Disp: 60 each, Rfl: 6   fluticasone furoate-vilanterol (BREO ELLIPTA) 200-25 MCG/INH AEPB, Inhale 1 puff into the lungs daily., Disp: 60 each, Rfl: 5   glipiZIDE (GLUCOTROL) 10 MG tablet, Take 5 mg by mouth 2 (two) times daily before a meal., Disp: , Rfl:    magnesium oxide (MAG-OX) 400 MG tablet, Take 400 mg by mouth 2 (two) times daily., Disp: , Rfl:    meclizine (ANTIVERT) 25 MG tablet, Take 25 mg by mouth 3 (three) times daily as needed for dizziness., Disp: , Rfl:    metFORMIN (GLUCOPHAGE) 1000 MG tablet, Take 1 tablet (1,000 mg total) by mouth in the morning and at bedtime., Disp: , Rfl:    Multiple Vitamin (MULTI-VITAMIN) tablet, Take 1 tablet by mouth daily., Disp: , Rfl:    omeprazole (PRILOSEC) 20 MG capsule, Take 20 mg by mouth See admin instructions. Every other night, Disp: , Rfl:    Pirfenidone (ESBRIET) 267 MG TABS, Take 1 tab three times daily for 7 days, then 2 tabs three times daily for 7 days, then 3 tabs three times daily thereafter., Disp: 270 tablet, Rfl: 0   Pirfenidone (ESBRIET) 267 MG TABS, Take 3 tablets (801 mg total) by mouth 3 (three) times daily with meals., Disp: 270 tablet, Rfl: 1   rosuvastatin (CRESTOR) 40 MG tablet, Take 40 mg by mouth at bedtime., Disp: , Rfl:    losartan  (COZAAR) 25 MG tablet, Take 1 tablet (25 mg total) by mouth daily., Disp: 90 tablet, Rfl: 3      Objective:   Vitals:   03/19/21 1057  BP: 114/68  Pulse: 79  Temp: 97.9 F (36.6 C)  TempSrc: Oral  SpO2: 100%  Weight: 183 lb (83 kg)  Height: 5\' 9"  (1.753 m)    Estimated body mass index is 27.02 kg/m as calculated from the following:   Height as of this encounter: 5\' 9"  (1.753 m).   Weight as of this encounter: 183 lb (83 kg).  @WEIGHTCHANGE @  [employer]   03/19/21 1057  Weight: 183 lb (83 kg)     Physical Exam  General: No distress. Looks well Neuro: Alert and Oriented x 3. GCS 15. Speech normal Psych: Pleasant Resp:  Barrel Chest - no.  Wheeze - no, Crackles - mild maybe, No overt respiratory distress CVS: Normal heart sounds. Murmurs - no Ext: Stigmata of Connective Tissue Disease - no HEENT: Normal upper airway. PEERL +. No post nasal drip        Assessment:       ICD-10-CM   1. IPF (idiopathic pulmonary fibrosis) (HCC)  J84.112 Hepatic function panel    Hepatic function panel    2. Chronic cough  R05.3     3. Post-nasal drip  R09.82     4. Gastroesophageal reflux disease, unspecified whether esophagitis present  K21.9     5. Encounter for medication monitoring  Z51.81 Hepatic function panel    Hepatic function panel         Plan:     Patient Instructions     ICD-10-CM   1. IPF (idiopathic pulmonary fibrosis) ([place])  J84.112     2. Chronic cough  R05.3     3. Post-nasal drip  R09.82     4. Gastroesophageal reflux disease, unspecified whether esophagitis present  K21.9     5. Encounter for medication monitoring  Z51.81      IPF: Clinically stable  Postnasal drip: This is also contributing to cough and you are on chronic Afrin  Symptoms:  -Shortness of breath: Is stable.  Respect reluctant to do pulmonary rehabilitation  -Cough: Is stable but still present.  Unable to tolerate gabapentin or prednisone and Tessalon cough also is  too expensive.  Currently managed with chronic Afrin and over-the-counter Robitussin  Acid reflux  -Mildly present and aggravated by pirfenidone  Plan  - Check liver function test today -Regarding acid reflux:  - Change omeprazole to taking it early in the morning on empty stomach to see if acid reflux will improve -Regarding cough and postnasal drip:  - Try over-the-counter saline nasal spray and steroid nasal spray in addition to Afrin.  Also continue with the Robitussin.  Based on shared decision making we will hold off on Tessalon, gabapentin or opioids -Continue pirfenidone  -I have sent a message to the pharmacist to help refill this  -Discussed clinical trials and we will consider you for the future especially cough trial   Follow-up -Do liver function test again in 1 month - Do spirometry and DLCO in 2 months -Return to see Scott Price for a 30-minute visit in 2 months  ( Level 05 visit: Estb 40-54 min   in  visit type: on-site physical face to visit  in total care time and counseling or/and coordination of care by this undersigned MD - Dr Scott Price. This includes one or more of the following on this same day 03/19/2021: pre-charting, chart review, note writing, documentation discussion of test results, diagnostic or treatment recommendations, prognosis, risks and benefits of management options, instructions, education, compliance or risk-factor reduction. It excludes time spent by the [facility] or office staff in the care of the patient. Actual time 94 min)   SIGNATURE    Dr. Brand Price, M.D., F.C.C.P,  Pulmonary and Critical Care Medicine Staff Physician, [place] Director - Interstitial Lung Disease  Program  Pulmonary [place] [facility] [place], [place], [address]  Pager: [phone], If no answer or between  15:00h - 7:00h: call [phone] Telephone: [phone]  12:31  PM 03/19/2021

## 2021-03-19 NOTE — Telephone Encounter (Signed)
Scott  Dennie Price  Is running out of esbriet. Please ensure refill

## 2021-03-19 NOTE — Patient Instructions (Addendum)
ICD-10-CM   1. IPF (idiopathic pulmonary fibrosis) ([place])  J84.112     2. Chronic cough  R05.3     3. Post-nasal drip  R09.82     4. Gastroesophageal reflux disease, unspecified whether esophagitis present  K21.9     5. Encounter for medication monitoring  Z51.81      IPF: Clinically stable  Postnasal drip: This is also contributing to cough and you are on chronic Afrin  Symptoms:  -Shortness of breath: Is stable.  Respect reluctant to do pulmonary rehabilitation  -Cough: Is stable but still present.  Unable to tolerate gabapentin or prednisone and Tessalon cough also is too expensive.  Currently managed with chronic Afrin and over-the-counter Robitussin  Acid reflux  -Mildly present and aggravated by pirfenidone  Plan  - Check liver function test today -Regarding acid reflux:  - Change omeprazole to taking it early in the morning on empty stomach to see if acid reflux will improve -Regarding cough and postnasal drip:  - Try over-the-counter saline nasal spray and steroid nasal spray in addition to Afrin.  Also continue with the Robitussin.  Based on shared decision making we will hold off on Tessalon, gabapentin or opioids -Continue pirfenidone  -I have sent a message to the pharmacist to help refill this  -Discussed clinical trials and we will consider you for the future especially cough trial   Follow-up -Do liver function test again in 1 month - Do spirometry and DLCO in 2 months -Return to see Dr. Chase Caller for a 30-minute visit in 2 months

## 2021-03-20 LAB — HEPATIC FUNCTION PANEL
ALT: 21 U/L (ref 0–53)
AST: 22 U/L (ref 0–37)
Albumin: 4.8 g/dL (ref 3.5–5.2)
Alkaline Phosphatase: 53 U/L (ref 39–117)
Bilirubin, Direct: 0.1 mg/dL (ref 0.0–0.3)
Total Bilirubin: 0.3 mg/dL (ref 0.2–1.2)
Total Protein: 8.1 g/dL (ref 6.0–8.3)

## 2021-03-21 MED ORDER — PIRFENIDONE 801 MG PO TABS: 801.0000 mg | ORAL_TABLET | Freq: Three times a day (TID) | ORAL | 1 refills | Status: DC

## 2021-03-21 NOTE — Telephone Encounter (Signed)
Hepatic function panel wnl. ATC patient on home and mobile - unable to reach. Home phone rang and went to busy tone. Called patient on mobile - left VM advising that rx was sent for higher strength tablet to reduce pill burden. Requested call back with any questions or concerns  Chesley Mires, PharmD, MPH, BCPS Clinical Pharmacist (Rheumatology and Pulmonology)

## 2021-04-09 ENCOUNTER — Ambulatory Visit (INDEPENDENT_AMBULATORY_CARE_PROVIDER_SITE_OTHER): Payer: No Typology Code available for payment source

## 2021-04-09 DIAGNOSIS — I442 Atrioventricular block, complete: Secondary | ICD-10-CM

## 2021-04-10 LAB — CUP PACEART REMOTE DEVICE CHECK
Battery Remaining Longevity: 150 mo
Battery Remaining Percentage: 100 %
Brady Statistic RA Percent Paced: 3 %
Brady Statistic RV Percent Paced: 100 %
Date Time Interrogation Session: 20230307212700
Lead Channel Impedance Value: 595 Ohm
Lead Channel Impedance Value: 818 Ohm
Lead Channel Pacing Threshold Amplitude: 0.5 V
Lead Channel Pacing Threshold Amplitude: 0.7 V
Lead Channel Pacing Threshold Pulse Width: 0.4 ms
Lead Channel Pacing Threshold Pulse Width: 0.4 ms
Lead Channel Setting Pacing Amplitude: 2 V
Lead Channel Setting Pacing Amplitude: 2.5 V
Lead Channel Setting Pacing Pulse Width: 0.4 ms
Lead Channel Setting Sensing Sensitivity: 2.5 mV
Pulse Gen Serial Number: 962270

## 2021-04-16 ENCOUNTER — Other Ambulatory Visit: Payer: Self-pay | Admitting: *Deleted

## 2021-04-16 ENCOUNTER — Other Ambulatory Visit (INDEPENDENT_AMBULATORY_CARE_PROVIDER_SITE_OTHER): Payer: No Typology Code available for payment source

## 2021-04-16 DIAGNOSIS — J84112 Idiopathic pulmonary fibrosis: Secondary | ICD-10-CM

## 2021-04-16 DIAGNOSIS — Z5181 Encounter for therapeutic drug level monitoring: Secondary | ICD-10-CM | POA: Diagnosis not present

## 2021-04-16 LAB — HEPATIC FUNCTION PANEL
ALT: 20 U/L (ref 0–53)
AST: 18 U/L (ref 0–37)
Albumin: 4.6 g/dL (ref 3.5–5.2)
Alkaline Phosphatase: 65 U/L (ref 39–117)
Bilirubin, Direct: 0.1 mg/dL (ref 0.0–0.3)
Total Bilirubin: 0.3 mg/dL (ref 0.2–1.2)
Total Protein: 6.8 g/dL (ref 6.0–8.3)

## 2021-04-22 NOTE — Progress Notes (Signed)
Remote pacemaker transmission.   

## 2021-04-26 ENCOUNTER — Ambulatory Visit: Payer: Medicare HMO | Admitting: Internal Medicine

## 2021-04-26 ENCOUNTER — Encounter: Payer: Self-pay | Admitting: Internal Medicine

## 2021-04-26 ENCOUNTER — Other Ambulatory Visit: Payer: Self-pay

## 2021-04-26 VITALS — BP 138/58 | HR 81 | Ht 69.0 in | Wt 185.2 lb

## 2021-04-26 DIAGNOSIS — E782 Mixed hyperlipidemia: Secondary | ICD-10-CM

## 2021-04-26 DIAGNOSIS — Z950 Presence of cardiac pacemaker: Secondary | ICD-10-CM | POA: Diagnosis not present

## 2021-04-26 DIAGNOSIS — I2510 Atherosclerotic heart disease of native coronary artery without angina pectoris: Secondary | ICD-10-CM

## 2021-04-26 DIAGNOSIS — I442 Atrioventricular block, complete: Secondary | ICD-10-CM

## 2021-04-26 DIAGNOSIS — I10 Essential (primary) hypertension: Secondary | ICD-10-CM

## 2021-04-26 NOTE — Patient Instructions (Addendum)
Medication Instructions:  ?Your physician recommends that you continue on your current medications as directed. Please refer to the Current Medication list given to you today. ? ?*If you need a refill on your cardiac medications before your next appointment, please call your pharmacy* ? ?Lab Work: ?Fasting Lipid Panel ? ?Follow-Up: ?At [facility], you and your health needs are our priority.  As part of our continuing mission to provide you with exceptional heart care, we have created designated Provider Care Teams.  These Care Teams include your primary Cardiologist (physician) and Advanced Practice Providers (APPs -  Physician Assistants and Nurse Practitioners) who all work together to provide you with the care you need, when you need it. ? ?We recommend signing up for the patient portal called "MyChart".  Sign up information is provided on this After Visit Summary.  MyChart is used to connect with patients for Virtual Visits (Telemedicine).  Patients are able to view lab/test results, encounter notes, upcoming appointments, etc.  Non-urgent messages can be sent to your provider as well.   ?To learn more about what you can do with MyChart, go to [email or web address].   ? ?Your next appointment:   ?1 year with Dr. Rennis Golden  ? ?You are due for a routine appointment with Dr. Ladona Ridgel. His scheduler will contact you about an appointment.  ? ? ? ?

## 2021-04-26 NOTE — Progress Notes (Signed)
? ?OFFICE NOTE ? ?Chief Complaint:  ?Follow-up ? ?Primary Care Physician: ?[facility] ? ?HPI:  ?Scott Price is a 76 y.o. male with a past medial history significant for type 2 diabetes and dyslipidemia with a family history of heart disease in multiple brothers.  Mr. Darko was seen over the weekend in the [facility], ER with progressive chest discomfort shortness of breath and dizziness.  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  He says during these episodes he gets chest tightness, fatigue and dizziness and has to sit down with some relief but his symptoms have been worsening.  He was seen in the ER again and ruled out for MI.  During his monitoring, he was noted to have predominantly sinus rhythm although he did have one short strip of heart block.  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  Today in follow-up his EKG demonstrates normal sinus rhythm at 76 without ischemic changes. ? ?12/13/2019 ? ?Mr. Frayre returns today for follow-up.  When I last saw him I was highly concerned about unstable angina.  He was scheduled the next day for cardiac catheterization.  Cardiac cath on November 22, 2019 showed only mild nonobstructive coronary disease with about 25% stenoses in all 3 coronary vessels.  Normal LVEF of 55 to 65% with normal LVEDP and no evidence of aortic valve stenosis.  Overall very reassuring however since then he has continued to have lifestyle limiting symptoms of chest pain, fatigue, dizziness and shortness of breath.  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  He does not have a primary care provider but did establish with a PCP again at the [facility] hospital but does not have an appointment until December.  He reportedly has a significant smoking history but quit a  number of years ago.  I would estimate between 50 to 75 pack years.  This is concerning for possible COPD as a cause of his symptoms.  Other possible causes could be aneurysm or perhaps pulmonary embolus.  I think that is less likely since his symptoms have been going on for some time but should be considered. ? ?03/05/2020 ? ?Mr. Sleight returns today for follow-up.  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  Prior to that he had a heart catheterization in October 2021 which showed nonobstructive multivessel coronary disease.  EF was normal.  He does have coronary calcification.  Since he had his pacemaker placed he is done very well.  A dual-chamber [employer] device.  He did not receive a remote monitor but is scheduled for remote pacer check in February.  He also has follow-up with Dr. Ladona Ridgel on February 28.  His CT scan did show honeycombing and some signs of interstitial lung disease in November.  I advised him to get pulmonary function testing and was again referred him to pulmonary however he would like to do this testing and follow-up through the [facility].  Currently seeing a provider there to schedule this. ? ?04/27/2020 ? ?Mr. Swanner is seen today for follow-up.  He has continued to have worsening shortness of breath.  He says he has to sleep upright in a recliner.  Has  had a little bit of swelling in his right ankle and continues to have some persistent crackles in the left lung base.  He underwent pulmonary function testing at the [place] interpreted by Dr. Marcelyn Bruins.  This demonstrated no evidence of obstruction, restriction or diffusion defect.  Based on that I am more concerned that this could be some heart failure.  Previously his CT angio did show honeycombing and suggestion of interstitial lung disease however possibly this was fluid.  His BNP however had been low at the time. ? ?06/11/2020 ? ?Mr. Foutz is seen today in  follow-up.  After starting him on some Lasix he reports no significant improvement in his shortness of breath however did have some improvement in his swelling.  BNP was low at retest, negative for heart failure.  He also had an unfortunate rash on Lasix.  Apparently has a history of sulfa allergy and it may have been the sulfa moiety in the drug that caused him the reaction.  Repeat echo showed normal systolic function with mild diastolic dysfunction but did not really suggest heart failure.  He still reports shortness of breath and that he does get situational chest tightness.  He says it feels like a squeezing in the mid central chest which is worse with exertion and relieved by rest.  These findings more suggestive of angina although his catheterization showed only mild nonobstructive coronary disease last year.  He is a diabetic and therefore possibly could have some small vessel disease. ? ?10/16/2020 ? ?Mr. Gloria returns today for follow-up.  Overall he says he seems to be doing a little bit better.  He did not really note a big difference in his chest discomfort with isosorbide however was placed on Breo Ellipta by Dr. Tonia Brooms and noted a significant improvement in this.  Unfortunately recently ran out of the medicine.  He does have a follow-up high-resolution CT to reexamine his interstitial prominence and sees Dr. Tonia Brooms in October.  I have encouraged him to reach out for refill of that medication.  He denies any further syncopal events after pacemaker for complete heart block. ? ?04/26/2021 ? ?Mr. Kail is seen today in follow-up.  Overall he says he is doing pretty well.  He recently ran out of his Breo inhaler.  He has follow-up with pulmonary next month including PFTs.  I encouraged him to reach out to them.  He denies any chest pain.  He is due for office pacemaker check with Dr. Ladona Ridgel.  Recurrent atrial fibrillation.  EKG today is a paced rhythm.  He does get care through the [place] in [place].  I do not  see any recent lipid testing. ? ?PMHx:  ?Past Medical History:  ?Diagnosis Date  ? Diabetes mellitus without complication (HCC)   ? High cholesterol   ? ? ?Past Surgical History:  ?Procedure Laterality Date  ? BACK SURGERY    ? LEFT HEART CATH AND CORONARY ANGIOGRAPHY N/A 11/22/2019  ? Procedure: LEFT HEART CATH AND CORONARY ANGIOGRAPHY;  Surgeon: Corky Crafts, MD;  Location: [facility] INVASIVE CV LAB;  Service: Cardiovascular;  Laterality: N/A;  ? NECK SURGERY    ? PACEMAKER IMPLANT N/A 12/26/2019  ? Procedure: PACEMAKER IMPLANT;  Surgeon: Marinus Maw, MD;  Location: [facility] INVASIVE CV LAB;  Service: Cardiovascular;  Laterality: N/A;  ? ? ?FAMHx:  ?Family History  ?Problem Relation Age of Onset  ? CAD Brother   ? CAD Brother   ? ? ?SOCHx:  ? reports that he  quit smoking about 23 years ago. His smoking use included cigarettes. He has never used smokeless tobacco. He reports that he does not currently use alcohol. He reports current drug use. Drug: Marijuana. ? ?ALLERGIES:  ?Allergies  ?Allergen Reactions  ? Doxycycline Shortness Of Breath and Rash  ? Lisinopril Cough  ? Amoxicillin Rash  ? Lasix [Furosemide] Rash  ? Prednisone   ?  High blood sugar  ? Sulfa Antibiotics Rash  ?  Headache/ Chest tightness/ stiff neck  ? ? ?ROS: ?Pertinent items noted in HPI and remainder of comprehensive ROS otherwise negative. ? ?HOME MEDS: ?Current Outpatient Medications on File Prior to Visit  ?Medication Sig Dispense Refill  ? albuterol (VENTOLIN HFA) 108 (90 Base) MCG/ACT inhaler Inhale 2 puffs into the lungs every 6 (six) hours as needed for wheezing or shortness of breath. 8 g 5  ? aspirin 81 MG EC tablet Take 81 mg by mouth daily.     ? Cholecalciferol (VITAMIN D3) 25 MCG (1000 UT) CAPS Take 1,000 Units by mouth daily.    ? cyanocobalamin 1000 MCG tablet Take 1,000 mcg by mouth daily.    ? empagliflozin (JARDIANCE) 10 MG TABS tablet Take 5 mg by mouth in the morning and at bedtime.    ? ferrous sulfate 325 (65 FE) MG tablet  Take 325 mg by mouth daily with breakfast.    ? glipiZIDE (GLUCOTROL) 10 MG tablet Take 5 mg by mouth 2 (two) times daily before a meal.    ? losartan (COZAAR) 25 MG tablet Take 1 tablet (25 mg total)

## 2021-05-01 LAB — LIPID PANEL
Chol/HDL Ratio: 2.4 ratio (ref 0.0–5.0)
Cholesterol, Total: 94 mg/dL — ABNORMAL LOW (ref 100–199)
HDL: 39 mg/dL — ABNORMAL LOW (ref >39)
LDL Chol Calc (NIH): 29 mg/dL (ref 0–99)
Triglycerides: 154 mg/dL — ABNORMAL HIGH (ref 0–149)
VLDL Cholesterol Cal: 26 mg/dL (ref 5–40)

## 2021-05-21 ENCOUNTER — Telehealth: Payer: Self-pay | Admitting: Internal Medicine

## 2021-05-21 NOTE — Telephone Encounter (Signed)
Not likely to be causes by a medication that he has been taking for several months and not had an issue. Sounds like a food allergy/contact dermatitis. Have him take allegra and benadryl. Think about any new foods he ate in the last 24-48 hours. Shell fish, protein, fruits/vegetables, sauces. Should see PCP. Needs ED eval if develops any shortness of breath

## 2021-05-21 NOTE — Telephone Encounter (Signed)
I called and spoke with the pt and notified of response per [facility]. He verbalized understanding. Nothing further needed.  ?

## 2021-05-21 NOTE — Telephone Encounter (Signed)
Spoke with the pt  ?He states having some swelling in his lips and lips feel painful for the past 2 wks  ?He states his face feels itchy and is "tingling around my head"  ?He denies any increased SOB or trouble swallowing  ?He states does not see any rash  ?He has been taking esbriet for several months and wonders if this is causing him these symptoms  ?He has not been started on any new meds  ?No openings today in the schedule  ?Please advise thanks! ? ?Allergies  ?Allergen Reactions  ? Doxycycline Shortness Of Breath and Rash  ? Lisinopril Cough  ? Amoxicillin Rash  ? Lasix [Furosemide] Rash  ? Prednisone   ?  High blood sugar  ? Sulfa Antibiotics Rash  ?  Headache/ Chest tightness/ stiff neck  ? ? ?

## 2021-05-23 NOTE — Progress Notes (Signed)
? ?Cardiology Office Note ?Date:  05/23/2021  ?Patient ID:  Scott BodeBilly L Price, DOB 01/01/1946, MRN [id number] ?[facility]  ?Cardiologist:  Dr. Rennis GoldenHilty ?Electrophysiologist: Dr. Ladona Ridgelaylor ? ?  ?Chief Complaint: annual EP visit ? ?History of Present Illness: ?Scott CossBilly L Price is a 76 y.o. 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 ?He comes in today to be seen for Dr. Ladona Ridgelaylor, last seen by him Feb 2022, doing well post pacing, no changes were made.  Discussed NOD by cath, preserved LVEF with reports of CP, SOB though better post pacing ? ?Follows with pulmonary team for ongoing chronic cough/CP/SOB, progressive disease, probable UIP pattern, on on pirfenidone, also has GERD ? ?Just saw Dr. Rennis GoldenHilty 04/26/21, had run out of his Virgel BouquetBreo, was pending f/u with his pulm team, discussed AFib (?), EKG was paced, [facility] labs [facility] ? ?NOT on a/c ? ?TODAY ?He is accompanied by his wife today. ?He has no new complaints since his visit with Dr. Orpah CobbHikty ?They have called for his Breo, but they have not auth refills yet.  He sees pulmonary team next week, but says he doesn't feel any different without it. 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 ?He will get SOB with longer walks/heavier exertion, this pre-dates his pacer implant. ?No CP, palpitations ?No near syncope or syncope since the pacer was placed ? ? ?Device information ?[employer]Sci dual chamber PPM implanted 12/26/2019 ? ? ?Past Medical History:  ?Diagnosis Date  ? Diabetes mellitus without complication (HCC)   ? High cholesterol   ? ? ?Past Surgical History:  ?Procedure Laterality Date  ? BACK SURGERY    ? LEFT HEART CATH AND CORONARY ANGIOGRAPHY N/A 11/22/2019  ? Procedure: LEFT HEART CATH AND CORONARY ANGIOGRAPHY;  Surgeon: Corky CraftsVaranasi, Jayadeep S, MD;  [facility];  Service: Cardiovascular;  Laterality: N/A;  ? NECK SURGERY    ?  PACEMAKER IMPLANT N/A 12/26/2019  ? Procedure: PACEMAKER IMPLANT;  Surgeon: Marinus Mawaylor, Gregg W, MD;  [facility];  Service: Cardiovascular;  Laterality: N/A;  ? ? ?Current Outpatient Medications  ?Medication Sig Dispense Refill  ? albuterol (VENTOLIN HFA) 108 (90 Base) MCG/ACT inhaler Inhale 2 puffs into the lungs every 6 (six) hours as needed for wheezing or shortness of breath. 8 g 5  ? aspirin 81 MG EC tablet Take 81 mg by mouth daily.     ? Cholecalciferol (VITAMIN D3) 25 MCG (1000 UT) CAPS Take 1,000 Units by mouth daily.    ? cyanocobalamin 1000 MCG tablet Take 1,000 mcg by mouth daily.    ? empagliflozin (JARDIANCE) 10 MG TABS tablet Take 5 mg by mouth in the morning and at bedtime.    ? ferrous sulfate 325 (65 FE) MG tablet Take 325 mg by mouth daily with breakfast.    ? fluticasone furoate-vilanterol (BREO ELLIPTA) 100-25 MCG/ACT AEPB Inhale 1 puff into the lungs daily. (Patient not taking: Reported on 04/26/2021) 60 each 6  ? fluticasone furoate-vilanterol (BREO ELLIPTA) 200-25 MCG/INH AEPB Inhale 1 puff into the lungs daily. (Patient not taking: Reported on 04/26/2021) 60 each 5  ? glipiZIDE (GLUCOTROL) 10 MG tablet Take 5 mg by mouth 2 (two) times daily before a meal.    ? losartan (COZAAR) 25 MG tablet Take 1 tablet (25 mg total) by mouth daily. 90  tablet 3  ? magnesium oxide (MAG-OX) 400 MG tablet Take 400 mg by mouth 2 (two) times daily.    ? meclizine (ANTIVERT) 25 MG tablet Take 25 mg by mouth 3 (three) times daily as needed for dizziness.    ? metFORMIN (GLUCOPHAGE) 1000 MG tablet Take 1 tablet (1,000 mg total) by mouth in the morning and at bedtime.    ? Multiple Vitamin (MULTI-VITAMIN) tablet Take 1 tablet by mouth daily.    ? omeprazole (PRILOSEC) 20 MG capsule Take 20 mg by mouth See admin instructions. Every other night    ? Pirfenidone (ESBRIET) 801 MG TABS Take 801 mg by mouth 3 (three) times daily with meals. 270 tablet 1  ? rosuvastatin (CRESTOR) 40 MG tablet Take 40 mg by  mouth at bedtime.    ? ?No current facility-administered medications for this visit.  ? ? ?Allergies:   Doxycycline, Lisinopril, Amoxicillin, Lasix [furosemide], Prednisone, and Sulfa antibiotics  ? ?Social History:  The patient  reports that he quit smoking about 23 years ago. His smoking use included cigarettes. He has never used smokeless tobacco. He reports that he does not currently use alcohol. He reports current drug use. Drug: Marijuana.  ? ?Family History:  The patient's family history includes CAD in his brother and brother. ? ?ROS:  Please see the history of present illness.    ?All other systems are reviewed and otherwise negative.  ? ?PHYSICAL EXAM:  ?VS:  There were no vitals taken for this visit. BMI: There is no height or weight on file to calculate BMI. ?Well nourished, well developed, in no acute distress ?HEENT: normocephalic, atraumatic ?Neck: no JVD, carotid bruits or masses ?Cardiac:  RRR; no significant murmurs, no rubs, or gallops ?Lungs:  CTA b/l, he has a slight inspiratory wheeze at the L base, no rhonchi or rales ?Abd: soft, nontender ?MS: no deformity or atrophy ?Ext: no edema ?Skin: warm and dry, no rash ?Neuro:  No gross deficits appreciated ?Psych: euthymic mood, full affect ? ?PPM site is stable, no tethering or discomfort ? ? ?EKG:  not done today ? ?Device interrogation done today and reviewed by myself:  ?Battery and lead measurements are good ?No R waves today at 50 ?No retrograde conduction today, noting a couple PMT episodes. ?He did have one Aflutter, 3 seconds ? ? ?05/25/2020: TTE ? 1. Left ventricular ejection fraction, by estimation, is 55 to 60%. The  ?left ventricle has normal function. The left ventricle has no regional  ?wall motion abnormalities. Left ventricular diastolic parameters are  ?consistent with Grade I diastolic  ?dysfunction (impaired relaxation).  ? 2. Right ventricular systolic function is normal. The right ventricular  ?size is normal. There is normal  pulmonary artery systolic pressure.  ? 3. The aortic valve is tricuspid. There is mild calcification of the  ?aortic valve. There is mild thickening of the aortic valve. Aortic valve  ?regurgitation is not visualized. No aortic stenosis is present.  ? 4. The mitral valve is grossly normal. Trivial mitral valve  ?regurgitation.  ? 5. Aortic dilatation noted. There is borderline dilatation of the aortic  ?root, measuring 38 mm.  ? 6. The inferior vena cava is normal in size with greater than 50%  ?respiratory variability, suggesting right atrial pressure of 3 mmHg.  ? ?11/22/2019: LHC ?Mid RCA lesion is 25% stenosed. ?Prox Cx to Mid Cx lesion is 25% stenosed. ?Prox LAD to Mid LAD lesion is 25% stenosed. ?The left ventricular systolic function is normal. ?LV  end diastolic pressure is normal. ?The left ventricular ejection fraction is 55-65% by visual estimate. ?There is no aortic valve stenosis. ?  ?Nonobstructive CAD.  Continue preventive therapy.  ? ?Recent Labs: ?12/20/2020: BUN 14; Creatinine, Ser 1.08; Hemoglobin 13.2; Platelets 205.0; Potassium 4.2; Sodium 139 ?04/16/2021: ALT 20  ?04/30/2021: Chol/HDL Ratio 2.4; Cholesterol, Total 94; HDL 39; LDL Chol Calc (NIH) 29; Triglycerides 154  ? ?CrCl cannot be calculated (Patient's most recent lab result is older than the maximum 21 days allowed.).  ? ?Wt Readings from Last 3 Encounters:  ?04/26/21 185 lb 3.2 oz (84 kg)  ?03/19/21 183 lb (83 kg)  ?12/20/20 179 lb 6.4 oz (81.4 kg)  ?  ? ?Other studies reviewed: ?Additional studies/records reviewed today include: summarized above ? ?ASSESSMENT AND PLAN: ? ?PPM ?Intact function, no programming changes made ? ?HTN ?Looks good ? ? ? ?Disposition: remotes as usual, in clinic with EP again in a year, sooner if needed ? ?Current medicines are reviewed at length with the patient today.  The patient did not have any concerns regarding medicines. ? ?Signed, ?Francis Dowse, PA-C ?05/23/2021 5:23 PM    ? ?[facility] ?[address] ?[address] ?[place] [address] ?[phone] (office)  ?[phone] (fax) ? ? ?

## 2021-05-24 ENCOUNTER — Encounter: Payer: Self-pay | Admitting: Physician Assistant

## 2021-05-24 ENCOUNTER — Ambulatory Visit (INDEPENDENT_AMBULATORY_CARE_PROVIDER_SITE_OTHER): Payer: No Typology Code available for payment source | Admitting: Physician Assistant

## 2021-05-24 VITALS — BP 108/60 | HR 83 | Ht 69.0 in | Wt 183.0 lb

## 2021-05-24 DIAGNOSIS — R001 Bradycardia, unspecified: Secondary | ICD-10-CM | POA: Diagnosis not present

## 2021-05-24 DIAGNOSIS — Z950 Presence of cardiac pacemaker: Secondary | ICD-10-CM

## 2021-05-24 DIAGNOSIS — I10 Essential (primary) hypertension: Secondary | ICD-10-CM

## 2021-05-24 DIAGNOSIS — I442 Atrioventricular block, complete: Secondary | ICD-10-CM

## 2021-05-24 LAB — CUP PACEART INCLINIC DEVICE CHECK
Date Time Interrogation Session: 20230421160202
Implantable Pulse Generator Implant Date: 20221122
Lead Channel Impedance Value: 615 Ohm
Lead Channel Impedance Value: 836 Ohm
Lead Channel Pacing Threshold Amplitude: 0.6 V
Lead Channel Pacing Threshold Amplitude: 0.9 V
Lead Channel Pacing Threshold Pulse Width: 0.4 ms
Lead Channel Pacing Threshold Pulse Width: 0.4 ms
Lead Channel Sensing Intrinsic Amplitude: 6.2 mV
Lead Channel Setting Pacing Amplitude: 2 V
Lead Channel Setting Pacing Amplitude: 2.5 V
Lead Channel Setting Pacing Pulse Width: 0.4 ms
Lead Channel Setting Sensing Sensitivity: 2.5 mV
Pulse Gen Serial Number: 962270

## 2021-05-24 NOTE — Patient Instructions (Signed)
Medication Instructions:   Your physician recommends that you continue on your current medications as directed. Please refer to the Current Medication list given to you today.  *If you need a refill on your cardiac medications before your next appointment, please call your pharmacy*   Lab Work: NONE ORDERED  TODAY   If you have labs (blood work) drawn today and your tests are completely normal, you will receive your results only by: MyChart Message (if you have MyChart) OR A paper copy in the mail If you have any lab test that is abnormal or we need to change your treatment, we will call you to review the results.   Testing/Procedures: NONE ORDERED  TODAY    Follow-Up: At [facility], you and your health needs are our priority.  As part of our continuing mission to provide you with exceptional heart care, we have created designated Provider Care Teams.  These Care Teams include your primary Cardiologist (physician) and Advanced Practice Providers (APPs -  Physician Assistants and Nurse Practitioners) who all work together to provide you with the care you need, when you need it.  We recommend signing up for the patient portal called "MyChart".  Sign up information is provided on this After Visit Summary.  MyChart is used to connect with patients for Virtual Visits (Telemedicine).  Patients are able to view lab/test results, encounter notes, upcoming appointments, etc.  Non-urgent messages can be sent to your provider as well.   To learn more about what you can do with MyChart, go to [email or web address].    Your next appointment:   1 year(s)  The format for your next appointment:   In Person  Provider:   You may see Dr.Taylor  or one of the following Advanced Practice Providers on your designated Care Team:   Renee Ursuy, PA-C   }   Other Instructions   Important Information About Sugar       

## 2021-05-29 ENCOUNTER — Other Ambulatory Visit: Payer: Self-pay | Admitting: *Deleted

## 2021-05-29 DIAGNOSIS — J84112 Idiopathic pulmonary fibrosis: Secondary | ICD-10-CM

## 2021-05-30 ENCOUNTER — Ambulatory Visit (INDEPENDENT_AMBULATORY_CARE_PROVIDER_SITE_OTHER): Payer: No Typology Code available for payment source | Admitting: Internal Medicine

## 2021-05-30 ENCOUNTER — Encounter: Payer: Self-pay | Admitting: Internal Medicine

## 2021-05-30 VITALS — BP 128/62 | HR 68 | Ht 70.0 in | Wt 182.8 lb

## 2021-05-30 DIAGNOSIS — Z5181 Encounter for therapeutic drug level monitoring: Secondary | ICD-10-CM | POA: Diagnosis not present

## 2021-05-30 DIAGNOSIS — L270 Generalized skin eruption due to drugs and medicaments taken internally: Secondary | ICD-10-CM | POA: Diagnosis not present

## 2021-05-30 DIAGNOSIS — R432 Parageusia: Secondary | ICD-10-CM | POA: Diagnosis not present

## 2021-05-30 DIAGNOSIS — J84112 Idiopathic pulmonary fibrosis: Secondary | ICD-10-CM

## 2021-05-30 LAB — PULMONARY FUNCTION TEST
DL/VA % pred: 84 %
DL/VA: 3.37 ml/min/mmHg/L
DLCO cor % pred: 83 %
DLCO cor: 20.11 ml/min/mmHg
DLCO unc % pred: 83 %
DLCO unc: 20.11 ml/min/mmHg
FEF 25-75 Pre: 4.48 L/sec
FEF2575-%Pred-Pre: 214 %
FEV1-%Pred-Pre: 123 %
FEV1-Pre: 3.57 L
FEV1FVC-%Pred-Pre: 114 %
FEV6-%Pred-Pre: 114 %
FEV6-Pre: 4.3 L
FEV6FVC-%Pred-Pre: 106 %
FVC-%Pred-Pre: 107 %
FVC-Pre: 4.32 L
Pre FEV1/FVC ratio: 83 %
Pre FEV6/FVC Ratio: 100 %

## 2021-05-30 NOTE — Addendum Note (Signed)
Addended by: Wyvonne Lenz on: 05/30/2021 04:14 PM ? ? Modules accepted: Orders ? ?

## 2021-05-30 NOTE — Progress Notes (Signed)
? ? ? ? ?IOV august 2022 for SOB by [facility] ? ?Subjective:  ? ?[name] GENDER: male DOB: 1945-12-13, MRN: [id number] ? ?Chief Complaint  ?Patient presents with  ? Consult  ?  Pt is being referred due to chronic cough.  Pt states that he does have complaints of chest tightness and SOB with activities.  ? ? ? ? ?PMH DMII, high cholesterol. He has recurrent issues with chest tightness. Dry cough, usually non-productive. Occasionally in the morning her has productive cough. He has gerd and takes a PPI. If he does any activity it makes the episodes worse. Hot shower and activity makes it worse. No seasonal changes that he notices. He does have sinus issues at times. Retired at a Agricultural consultant. Was in [place], [place] 1966 - 1971. No pets in the house. PFTS at [facility] ratio normal, FEV1 2L, normal dlco, normal tlc. He stays active and still working part time. He has used albuterol in the past prior to getting his pacemaker.  ? ?CT scan of the chest was completed in November 2021 which revealed lower lobe honeycombing and early evidence of interstitial lung disease. ?xxxxxx ? ?OV 11/23/2020: here to day for follow up. Unfortunately he did not have his CT Chest complete. It was set up through the [place] and he was not prepared to pay for it. His cough is better. His chest tightness is better as well.  Has been using his inhalers. ? ?OV 12/20/2020 -transfer of care to the ILD center to Dr. Chase Caller.  Referred by Dr. Leory Plowman Icard ? ?Subjective:  ?[name], male , DOB: July 25, 1945 , age 76 y.o. , MRN: [id number] , AD[address]ESS: [address] ?[place] [id number] ?PCP [facility] ?Patient Care Team: ?[facility] as [facility] - General ?Pixie Casino, MD as PCP - Cardiology (Cardiology) ? ?This Provider for this visit: Treatment Team:  ?Attending Provider: Brand Males, MD ? ? ? ?12/20/2020 -   ?Chief Complaint  ?Patient presents with  ? Follow-up  ?  Chest  tightness  ? ? ? ?HPI ?Scott Price 76 y.o. -seen in August 2022 in October 2022 by Dr. Valeta Harms.  He is here with his wife.  He is a English as a second language teacher. Dr Gwenette Greet at the [facility] in the area is retired.  Therefore he is seeing [place].  He reports insidious onset of shortness of breath at least a year.  This started around the time he had his pacemaker.  The pacemaker did get rid of his syncope but not the shortness of breath.  Is present on exertion relieved by rest.  Recently Dr. Valeta Harms started [facility] and this is helped his chest tightness on exertion but he still continues have significant shortness of breath with exertion and cough.  Documented below.  He is aware that he has pulmonary fibrosis.  His symptoms are somewhat progressive.  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  Shared with the family his results.  Review of the records indicate that he has not had serology so far.  An ILD questionnaire packet was mailed out to him but he has not received it yet so we do not have the data.  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  His other medications include Breo and omeprazole.  He has a history of diverticulitis.  No coronary artery disease but he has a pacemaker.  He is not on anticoagulation he  is on aspirin only.  His walking desaturation test shows adequate oxygenation with minimal pulse ox drop. ? ? ?[facility] Comprehensive ILD Questionnaire -dictated on 03/19/2021 ? ?Symptoms:  ?x ? ? ? ?Past Medical History :  ?-Denies any asthma or COPD or heart failure.  Denies collagen vascular disease..  Denies sleep apnea ?-Does have acid reflux ?-Does have diabetes.  Intolerant to prednisone.  Makes him very hyperglycemic ?-Intolerant to gabapentin ?-Never had COVID ? ? ?ROS:  ?-Does have exertional fatigue ?-10 pound weight loss prior to the November 2022 consult ?-Dry eyes present ?-Does have occasional acid reflux which is fairly well controlled with  omeprazole ? ?FAMILY HISTORY of LUNG DISEASE:  ?-Mom had asthma ? ?PERSONAL EXPOSURE HISTORY:  ?-Smoke cigarettes stop smoking 23 years ago.  Never did vaping in the past smoked some amount of marijuana but not much.  No cocaine use no intravenous drug use ? ?HOME  EXPOSURE and HOBBY DETAILS :  ?-Lives in the urban house since 1997.  The house was 77 years old.  No dampness no mold or mildew.  Does not use CPAP.  Extensive organic antigen exposure history is negative ? ?OCCUPATIONAL HISTORY (122 questions) : ?-He is a veteran but no agent orange exposure.  Has done gardening.  Has done woodwork.  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  Has done a garage repair.  Has done furniture work.  Has done some car Field seismologist work.  Has done machine operating.  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 ?PULMONARY TOXICITY HISTORY (27 items):  ?-Denies but is intolerant to gabapentin makes him sleepy.  Intolerant to prednisone. ?-For postnasal drip has tried nasal spray saline and steroid nasal spray and it does not work ? ?INVESTIGATIONS: ?-Below ? ? ?CT Chest data - HRCT 11/27/20 ? ?Study Result ? ?Narrative & Impression  ?CLINICAL DATA:  76 year old male with history of interstitial lung ?disease. Follow-up study. ?  ?EXAM: ?CT CHEST WITHOUT CONTRAST ?  ?TECHNIQUE: ?Multidetector CT imaging of the chest was performed following the ?standard protocol without intravenous contrast. High resolution ?imaging of the lungs, as well as inspiratory and expiratory imaging, ?was performed. ?  ?COMPARISON:  Chest CT 12/19/2019. ?  ?FINDINGS: ?Cardiovascular: Heart size is normal. There is no significant ?pericardial fluid, thickening or pericardial calcification. There is ?aortic atherosclerosis, as well as atherosclerosis of the great ?vessels of the mediastinum and the coronary arteries, including ?calcified atherosclerotic plaque in the left main, left anterior ?descending, left circumflex and right coronary  arteries. Severe ?calcifications of the aortic valve and mitral annulus. Left-sided ?pacemaker device in place with lead tips terminating in the right ?atrial appendage and right ventricular apex. ?  ?Mediastinum/Nodes: No pathologically enlarged mediastinal or hilar ?lymph nodes. Please note that accurate exclusion of hilar adenopathy ?is limited on noncontrast CT scans. Esophagus is unremarkable in ?appearance. No axillary lymphadenopathy. ?  ?Lungs/Pleura: High-resolution images demonstrate widespread but ?patchy areas of peripheral predominant ground-glass attenuation, ?septal thickening, subpleural reticulation, cylindrical ?bronchiectasis and peripheral bronchiolectasis. Findings appear ?progressive compared to the prior study. Findings have a definitive ?craniocaudal gradient. No frank honeycombing is confidently ?identified at this time. Inspiratory and expiratory imaging ?demonstrates some very mild air trapping in the lung bases ?indicative of mild small airways disease. No acute consolidative ?airspace disease. No pleural effusions. No suspicious appearing ?pulmonary nodules or masses are noted. ?  ?Upper Abdomen: Aortic atherosclerosis. ?  ?Musculoskeletal: Orthopedic fixation hardware in the lower cervical ?spine incidentally noted.  There are no aggressive appearing lytic or ?blastic lesions noted in the visualized portions of the skeleton. ?  ?IMPRESSION: ?1. 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 ?2. 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 Assessment for potential risk factor ?modification, dietary therapy or pharmacologic therapy may be ?warranted, if clinically indicated. ?3. There are calcifications of the aortic valve and mitral annulus. ?Echocardiographic correlation for evaluation of potential valvular ?dysfunction may be  warranted if clinically indicated. ?  ?Aortic Atherosclerosis (ICD10-I70.0). ?  ?  ?Electronically Signed ?  By: Vinnie Langton M.D. ?  On: 11/28/2020 12:46  ? ? ?No results found. ? ?[place] cath  oct 2021 ?Mid

## 2021-05-30 NOTE — Patient Instructions (Signed)
Performed Spirometry and DLCO today.  ?

## 2021-05-30 NOTE — Progress Notes (Signed)
Performed Spirometry and DLCO today.  ?

## 2021-05-30 NOTE — Patient Instructions (Addendum)
ICD-10-CM   ?1. IPF (idiopathic pulmonary fibrosis) (HCC)  J84.112   ?  ?2. Encounter for medication monitoring  Z51.81   ?  ?3. Dysgeusia  R43.2   ?  ?4. Drug rash  L27.0   ?  ? ? ?Rash and taste issuse very likely due to esbriet ? ?IPF istelf stable ? ?Plan ? -check cbc with diff, blood chemistry. LFT  ?=- stop esbriet - we will list as allergy ? - apply sunscreen and  moisturizing lotion  ?- encourage seeing dermatology at [facility] ? ?Followup ? 7 - 14 days video with app to ensure rash is healing without esbiret ?6-8 weeks Dr Scott Price ? - 30 min to to discuss next steps incuding ofev consideration in setting of prior diverticulits ? ?

## 2021-05-31 LAB — CBC WITH DIFFERENTIAL/PLATELET
Basophils Absolute: 0 10*3/uL (ref 0.0–0.1)
Basophils Relative: 0.6 % (ref 0.0–3.0)
Eosinophils Absolute: 0.4 10*3/uL (ref 0.0–0.7)
Eosinophils Relative: 5.3 % — ABNORMAL HIGH (ref 0.0–5.0)
HCT: 41 % (ref 39.0–52.0)
Hemoglobin: 13.7 g/dL (ref 13.0–17.0)
Lymphocytes Relative: 20.4 % (ref 12.0–46.0)
Lymphs Abs: 1.4 10*3/uL (ref 0.7–4.0)
MCHC: 33.5 g/dL (ref 30.0–36.0)
MCV: 91.2 fl (ref 78.0–100.0)
Monocytes Absolute: 0.7 10*3/uL (ref 0.1–1.0)
Monocytes Relative: 10.5 % (ref 3.0–12.0)
Neutro Abs: 4.2 10*3/uL (ref 1.4–7.7)
Neutrophils Relative %: 63.2 % (ref 43.0–77.0)
Platelets: 187 10*3/uL (ref 150.0–400.0)
RBC: 4.5 Mil/uL (ref 4.22–5.81)
RDW: 15.3 % (ref 11.5–15.5)
WBC: 6.7 10*3/uL (ref 4.0–10.5)

## 2021-05-31 LAB — BASIC METABOLIC PANEL
BUN: 13 mg/dL (ref 6–23)
CO2: 26 mEq/L (ref 19–32)
Calcium: 9.9 mg/dL (ref 8.4–10.5)
Chloride: 103 mEq/L (ref 96–112)
Creatinine, Ser: 1.05 mg/dL (ref 0.40–1.50)
GFR: 68.84 mL/min (ref >60.00)
Glucose, Bld: 102 mg/dL — ABNORMAL HIGH (ref 70–99)
Potassium: 4.2 mEq/L (ref 3.5–5.1)
Sodium: 140 mEq/L (ref 135–145)

## 2021-05-31 LAB — HEPATIC FUNCTION PANEL
ALT: 23 U/L (ref 0–53)
AST: 22 U/L (ref 0–37)
Albumin: 4.6 g/dL (ref 3.5–5.2)
Alkaline Phosphatase: 58 U/L (ref 39–117)
Bilirubin, Direct: 0.1 mg/dL (ref 0.0–0.3)
Total Bilirubin: 0.4 mg/dL (ref 0.2–1.2)
Total Protein: 7.2 g/dL (ref 6.0–8.3)

## 2021-06-02 NOTE — Progress Notes (Signed)
Blood labs normal. Blood eos slightly high but still nromal. Prob allergy related

## 2021-06-13 ENCOUNTER — Telehealth (INDEPENDENT_AMBULATORY_CARE_PROVIDER_SITE_OTHER): Payer: No Typology Code available for payment source | Admitting: Primary Care

## 2021-06-13 DIAGNOSIS — L270 Generalized skin eruption due to drugs and medicaments taken internally: Secondary | ICD-10-CM | POA: Diagnosis not present

## 2021-06-13 DIAGNOSIS — J84112 Idiopathic pulmonary fibrosis: Secondary | ICD-10-CM

## 2021-06-13 NOTE — Progress Notes (Signed)
Virtual Visit via Video Note ? ?I connected with Scott Price on 06/13/21 at  9:00 AM EDT by a video enabled telemedicine application and verified that I am speaking with the correct person using two identifiers. ? ?Location: ?Patient: Home ?Provider: Office ?  ?I discussed the limitations of evaluation and management by telemedicine and the availability of in person appointments. The patient expressed understanding and agreed to proceed. ? ?History of Present Illness: ? ?76 year old male, former 6 motor.  Past medical history significant for hypertension, complete heart block, pacemaker, IPF. ? ?Previous [facility] pulmonary encounter: ?Diagnois is based on age > 56, ex smoker, progressive disease, probable UIP pattern on CT., negative serolgy and ILD qquewtionaire and acid reflux ?  ?-Start pirfenidone around Christmas 2022 - stopped 05/30/21 due to rash/taste ?-Significant out of proportion cough ?  ?HPI ?Scott Price 76 y.o. -returns for follow-up.  He is here with his wife.  He is a little bit upset because of running 30 minutes behind.  Apologize for this delay.  He tells me respiratory wise he stable.  He had pulmonary function test that looks for fairly robust.  However in the last 3 weeks he has had rash in his face.  This whole face is red.  He also some increased erythema in his bilateral forearms.  These are sun exposed areas.  He does admit to applying sunscreen but he is on pirfenidone at the same time.  2 weeks ago he ended up in the emergency department on 05/18/2021 and was given valacyclovir for blistering in the lips but this has not helped.  He is applying Chapstick.  He is frustrated by this.  Also simultaneously since starting pirfenidone he has had altered taste with food. ?  ?  ?We did discuss potential steroids for his rash but he wants to follow with his primary dermatologist through the [place].  He is also worried about steroids because of its negative effect with his blood sugars. ? ? ?06/13/2021-  Follow-up/rash  ?Patient contacted today for 2-week follow-up.  IPF itself is stable.  Esbriet was stopped due to rash and listed as an allergy. He is doing well today. Facial rash and lip swelling have almost completely resolved, he states that it is much better. He has been wearing sunscreen as instructed. [facility] but [facility] on [facility] up with them. He has a follow-up scheduled for July with Dr. Marchelle Gearing to discuss alternative antifibrotic OFEV.  ? ?  ?Observations/Objective: ? ?- Appears well; No overt shortness of breath or wheezing ? ?Assessment and Plan: ? ?Drug Rash: ?- Improved; Facial rash felt to be from drug allergy to Esbriet. Medication was stopped on 05/30/21 and rash has since nearly resolved. Continue to wear sunscreen when outside. Follow up with dermatology recommended  ? ?Follow Up Instructions: ? ?July with Dr. Marchelle Gearing to discuss alternate antifibrotics  ?  ?I discussed the assessment and treatment plan with the patient. The patient was provided an opportunity to ask questions and all were answered. The patient agreed with the plan and demonstrated an understanding of the instructions. ?  ?The patient was advised to call back or seek an in-person evaluation if the symptoms worsen or if the condition fails to improve as anticipated. ? ?I provided 18 minutes of non-face-to-face time during this encounter. ? ? ?Glenford Bayley, NP ? ?

## 2021-07-09 ENCOUNTER — Ambulatory Visit (INDEPENDENT_AMBULATORY_CARE_PROVIDER_SITE_OTHER): Payer: Medicare HMO

## 2021-07-09 DIAGNOSIS — I442 Atrioventricular block, complete: Secondary | ICD-10-CM

## 2021-07-10 LAB — CUP PACEART REMOTE DEVICE CHECK
Battery Remaining Longevity: 138 mo
Battery Remaining Percentage: 100 %
Brady Statistic RA Percent Paced: 6 %
Brady Statistic RV Percent Paced: 100 %
Date Time Interrogation Session: 20230606232000
Lead Channel Impedance Value: 593 Ohm
Lead Channel Impedance Value: 804 Ohm
Lead Channel Pacing Threshold Amplitude: 0.5 V
Lead Channel Pacing Threshold Amplitude: 0.7 V
Lead Channel Pacing Threshold Pulse Width: 0.4 ms
Lead Channel Pacing Threshold Pulse Width: 0.4 ms
Lead Channel Setting Pacing Amplitude: 2 V
Lead Channel Setting Pacing Amplitude: 2.5 V
Lead Channel Setting Pacing Pulse Width: 0.4 ms
Lead Channel Setting Sensing Sensitivity: 2.5 mV
Pulse Gen Serial Number: 962270

## 2021-07-23 NOTE — Progress Notes (Signed)
Remote pacemaker transmission.   

## 2021-08-23 ENCOUNTER — Ambulatory Visit (INDEPENDENT_AMBULATORY_CARE_PROVIDER_SITE_OTHER): Payer: No Typology Code available for payment source | Admitting: Internal Medicine

## 2021-08-23 ENCOUNTER — Encounter: Payer: Self-pay | Admitting: Internal Medicine

## 2021-08-23 VITALS — BP 110/60 | HR 71 | Temp 98.2°F | Ht 69.0 in | Wt 184.0 lb

## 2021-08-23 DIAGNOSIS — J84112 Idiopathic pulmonary fibrosis: Secondary | ICD-10-CM | POA: Diagnosis not present

## 2021-08-23 DIAGNOSIS — Z5181 Encounter for therapeutic drug level monitoring: Secondary | ICD-10-CM

## 2021-08-23 DIAGNOSIS — L270 Generalized skin eruption due to drugs and medicaments taken internally: Secondary | ICD-10-CM | POA: Diagnosis not present

## 2021-08-23 NOTE — Patient Instructions (Addendum)
Drug rash due to pirfenidone  -Glad rash and itching and taste issues resolved after stopping pirfenidone  Plan - No more pirfenidone [it already is listed as allergy]   IPF (idiopathic pulmonary fibrosis) (HCC) Encounter for medication monitoring  -IPF is clinically stable compared to last visit but you are currently not on any antifibrotic -Currently diverticulitis is not   Plan  -check cbc with diff, blood chemistry, BNP. LFT  -Start nintedanib 100 mg twice daily with food   -Our pharmacy to [facility] to get [facility] the [facility]  -This is a lower dose  -If you are  stable on this for a few months then we can escalate the dose  -Discussed side effect profile of diarrhea, rare diverticulitis and even rare cardiac issues but overall risk versus benefit -we have agreed to continue -Do spirometry and DLCO in 10-12 weeks  Chronic cough  Plan  - Okay for albuterol as needed - Start over-the-counter Mucinex as needed -If problems persist can consider cough recent study   Followup 6 weeks with nurse practitioner Dr. Marchelle Gearing to ensure uptake with nintedanib is going well 10-12 weeks 30-minute visit with Dr. Marchelle Gearing  -Symptom score and walking desaturation test at follow-up

## 2021-08-23 NOTE — Progress Notes (Signed)
IOV august 2022 for SOB by Clinic, Scott Price  Subjective:   [name]: male DOB: 05/17/1945, MRN: [id number]  Chief Complaint  Patient presents with   Consult    Pt is being referred due to chronic cough.  Pt states that he does have complaints of chest tightness and SOB with activities.      PMH DMII, high cholesterol. He has recurrent issues with chest tightness. Dry cough, usually non-productive. Occasionally in the morning her has productive cough. He has gerd and takes a PPI. If he does any activity it makes the episodes worse. Hot shower and activity makes it worse. No seasonal changes that he notices. He does have sinus issues at times. Retired at a Agricultural consultant. Was in [place], [place] 1966 - 1971. No pets in the house. PFTS at [facility] ratio normal, FEV1 2L, normal dlco, normal tlc. He stays active and still working part time. He has used albuterol in the past prior to getting his pacemaker.   CT scan of the chest was completed in November 2021 which revealed lower lobe honeycombing and early evidence of interstitial lung disease. xxxxxx  OV 11/23/2020: here to day for follow up. Unfortunately he did not have his CT Chest complete. It was set up through the [place] and he was not prepared to pay for it. His cough is better. His chest tightness is better as well.  Has been using his inhalers.  OV 12/20/2020 -transfer of care to the ILD center to Dr. Chase Caller.  Referred by Dr. June Leap  Subjective:  [name], male , DOB: Nov 30, 1945 , age 76 y.o. , MRN: [id number] , ADDRESS: [place] [address] PCP Clinic, Scott Price Patient Care Team: Clinic, Scott Price as PCP - General Debara Pickett Nadean Corwin, MD as PCP - Cardiology (Cardiology)  This Provider for this visit: Treatment Team:  Attending Provider: Brand Males, MD    12/20/2020 -   Chief Complaint  Patient presents with   Follow-up    Chest  tightness     [name] 76 y.o. -seen in August 2022 in October 2022 by Dr. Valeta Harms.  He is here with his wife.  He is a English as a second language teacher. Dr Gwenette Greet at the [facility] in the area is retired.  Therefore he is seeing [place].  He reports insidious onset of shortness of breath at least a year.  This started around the time he had his pacemaker.  The pacemaker did get rid of his syncope but not the shortness of breath.  Is present on exertion relieved by rest.  Recently Dr. Valeta Harms started [facility] and this is helped his chest tightness on exertion but he still continues have significant shortness of breath with exertion and cough.  Documented below.  He is aware that he has pulmonary fibrosis.  His symptoms are somewhat progressive.  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  Shared with the family his results.  Review of the records indicate that he has not had serology so far.  An ILD questionnaire packet was mailed out to him but he has not received it yet so we do not have the data.  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  His other medications include Breo and omeprazole.  He has a history of diverticulitis.  No coronary artery disease but he has a pacemaker.  He is not on anticoagulation  he is on aspirin only.  His walking desaturation test shows adequate oxygenation with minimal pulse ox drop.   [facility] Comprehensive ILD Questionnaire -dictated on 03/19/2021  Symptoms:  x    Past Medical History :  -Denies any asthma or COPD or heart failure.  Denies collagen vascular disease..  Denies sleep apnea -Does have acid reflux -Does have diabetes.  Intolerant to prednisone.  Makes him very hyperglycemic -Intolerant to gabapentin -Never had COVID   ROS:  -Does have exertional fatigue -10 pound weight loss prior to the November 2022 consult -Dry eyes present -Does have occasional acid reflux which is fairly well controlled with  omeprazole  FAMILY HISTORY of LUNG DISEASE:  -Mom had asthma  PERSONAL EXPOSURE HISTORY:  -Smoke cigarettes stop smoking 23 years ago.  Never did vaping in the past smoked some amount of marijuana but not much.  No cocaine use no intravenous drug use  HOME  EXPOSURE and HOBBY DETAILS :  -Lives in the urban house since 1997.  The house was 77 years old.  No dampness no mold or mildew.  Does not use CPAP.  Extensive organic antigen exposure history is negative  OCCUPATIONAL HISTORY (122 questions) : -He is a veteran but no agent orange exposure.  Has done gardening.  Has done woodwork.  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  Has done a garage repair.  Has done furniture work.  Has done some car Field seismologist work.  Has done machine operating.  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  Intolerant to prednisone. -For postnasal drip has tried nasal spray saline and steroid nasal spray and it does not work  INVESTIGATIONS: -Below   CT Chest data - HRCT 11/27/20  Study Result  Narrative & Impression  CLINICAL DATA:  76 year old male with history of interstitial lung disease. Follow-up study.   EXAM: CT CHEST WITHOUT CONTRAST   TECHNIQUE: Multidetector CT imaging of the chest was performed following the standard protocol without intravenous contrast. High resolution imaging of the lungs, as well as inspiratory and expiratory imaging, was performed.   COMPARISON:  Chest CT 12/19/2019.   FINDINGS: Cardiovascular: Heart size is normal. There is no significant pericardial fluid, thickening or pericardial calcification. There is aortic atherosclerosis, as well as atherosclerosis of the great vessels of the mediastinum and the coronary arteries, including calcified atherosclerotic plaque in the left main, left anterior descending, left circumflex and right coronary  arteries. Severe calcifications of the aortic valve and mitral annulus. Left-sided pacemaker device in place with lead tips terminating in the right atrial appendage and right ventricular apex.   Mediastinum/Nodes: No pathologically enlarged mediastinal or hilar lymph nodes. Please note that accurate exclusion of hilar adenopathy is limited on noncontrast CT scans. Esophagus is unremarkable in appearance. No axillary lymphadenopathy.   Lungs/Pleura: High-resolution images demonstrate widespread but patchy areas of peripheral predominant ground-glass attenuation, septal thickening, subpleural reticulation, cylindrical bronchiectasis and peripheral bronchiolectasis. Findings appear progressive compared to the prior study. Findings have a definitive craniocaudal gradient. No frank honeycombing is confidently identified at this time. Inspiratory and expiratory imaging demonstrates some very mild air trapping in the lung bases indicative of mild small airways disease. No acute consolidative airspace disease. No pleural effusions. No suspicious appearing pulmonary nodules or masses are noted.   Upper Abdomen: Aortic atherosclerosis.   Musculoskeletal: Orthopedic fixation hardware in the lower cervical spine incidentally  noted. There are no aggressive appearing lytic or blastic lesions noted in the visualized portions of the skeleton.   IMPRESSION: 1. Progressively worsening interstitial lung disease, 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 2. Aortic atherosclerosis, in addition to left main and 3 vessel coronary artery disease. Assessment for potential risk factor modification, dietary therapy or pharmacologic therapy may be warranted, if clinically indicated. 3. There are calcifications of the aortic valve and mitral annulus. Echocardiographic correlation for evaluation of potential valvular dysfunction may be  warranted if clinically indicated.   Aortic Atherosclerosis (ICD10-I70.0).     Electronically Signed   By: Vinnie Langton M.D.   On: 11/28/2020 12:46    No results found.  Cardoiac cath  oct 2021 Mid RCA lesion is 25% stenosed. Prox Cx to Mid Cx lesion is 25% stenosed. Prox LAD to Mid LAD lesion is 25% stenosed. The left ventricular systolic function is normal. LV end diastolic pressure is normal. The left ventricular ejection fraction is 55-65% by visual estimate. There is no aortic valve stenosis.   Nonobstructive CAD.  Continue preventive therapy  PFT  No flowsheet data found.   Latest Reference Range & Units 11/19/19 12:17  Creatinine 0.61 - 1.24 mg/dL 1.00    Latest Reference Range & Units 11/19/19 12:17  Hemoglobin 13.0 - 17.0 g/dL 14.1       OV 02/08/2021  Subjective:  [name], male , DOB: Jan 25, 1946 , age 17 y.o. , MRN: [id number] , ADDRESS: [place] [address] PCP Clinic, Scott Price Patient Care Team: Clinic, Scott Price as PCP - General Debara Pickett Nadean Corwin, MD as PCP - Cardiology (Cardiology)  This Provider for this visit: Treatment Team:  Attending Provider: Brand Males, MD  Type of visit:Video Circumstance: COVID-19 national emergency Identification of patient Scott Price with 09/02/1945 and MRN [id number] - 2 person identifier Risks: Risks, benefits, limitations of telephone visit explained. Patient understood and verbalized agreement to proceed Anyone else on call: his wife on side Patient location: [place] [address] This provider location: [address], [address]; [place]; [place] [address]. [facility]. [phone]    02/08/2021 - clinical IPF followup - to assess esbriet uptake    [name] 76 y.o. - only on 2nd week of esbreit. On 2 pills tid. Sunday 02/10/21 ends 2pills tid. On Monday 02/11/21 will go to 3 pills tid. No side effects. Spacing 5-6h apart .  Does not wear sunscreen bu wears hat and long sleeve clothes. Does have sunscren. Reviewd serology and cbc/chmeistry - these are normal  However, reports chest congestion. Hacks and coughs. Sputum +. Says it is baseline few months ago. Rates  it as 4-5 of 5.Does not know sputum color - but thinks it is clear. Not wheezing. Clears throat. fEels he says he cannot take prednisone - even 5 days - drives him hyperglucemic 400s  AUTOIMMUNE 12/20/20   Latest Reference Range & Units 11/19/19 12:17 12/14/19 09:41 04/27/20 15:59 12/20/20 15:13  Sed Rate 0 - 20 mm/hr    7  Glucose 70 - 99 mg/dL 156 (H) 207 (H) 157 (H) 166 (H)  Anti Nuclear Antibody (ANA) NEGATIVE     NEGATIVE  Angiotensin-Converting Enzyme 9 - 67 U/L    20  ds DNA Ab IU/mL    <1  RA Latex Turbid. <14 IU/mL    <14  SSA (Ro) (  ENA) Antibody, IgG <1.0 NEG AI    <1.0 NEG  SSB (La) (ENA) Antibody, IgG <1.0 NEG AI    <1.0 NEG  Scleroderma (Scl-70) (ENA) Antibody, IgG <1.0 NEG AI    <1.0 NEG  QUANTIFERON-TB GOLD PLUS     Rpt  (H): Data is abnormally high Rpt: View report in Results Review for more information   PFT  No flowsheet data found  OV 03/19/2021  Subjective:  [name], male , DOB: January 10, 1946 , age 52 y.o. , MRN: [id number] , ADDRESS: [place] [address] PCP Clinic, Scott Price Patient Care Team: Clinic, Scott Price as PCP - General Debara Pickett Nadean Corwin, MD as PCP - Cardiology (Cardiology)  This Provider for this visit: Treatment Team:  Attending Provider: Brand Males, MD    03/19/2021 -   Chief Complaint  Patient presents with   Follow-up    Pt states he has been doing okay since last visit. States he does still become SOB with exertion and still has complaints of coughing that is worse in the mornings.   Diagnois is based on age > 27, ex smoker, progressive disease, probable UIP pattern on CT., negative serolgy and ILD qquewtionaire and acid reflux  -Start pirfenidone  around Christmas 2022 -Significant out of proportion cough  [name] 76 y.o. -returns for follow-up.  I reviewed his ILD questionnaire and his exposures consistent with IPF.  Currently he is on pirfenidone.  He is now on 3 pills 3 times daily for at least a month.  He says ever since going to 3 pills 3 times daily he has occasional mild acid reflux which his wife also test.  He feels a heartburn.  It is mild and tolerable.  He does take omeprazole but he is taking it with food as opposed to empty stomach.  There is no nausea.  No vomiting no diarrhea.  He feels his weight is stable.  Overall he is pleased with his Esbriet tolerance.  His chronic symptoms of shortness of breath and cough are stable although the cough is the most significant of symptoms.  He rates it as a 3 out of 5.  It is worse early in the morning than the rest of the day.  There is clear sputum.  Last visit I prescribed Robitussin which is helping him.  I also suggested he take Tessalon but he he is unable to afford a $66 co-pay.  We discussed gabapentin but this makes him sleepy from a previous neuropathic experience.  We discussed prednisone but this makes him hyperglycemic he does not want to do it.  We discussed opioids but he does not want to do that either.  He is on chronic Afrin nasal spray.  We discussed taking saline nasal spray or nasal steroids for his sinus drainage but he feels those things do not help.  Nevertheless he is willing to try it.  He is going to have a liver function test today  We discussed participation in clinical trials and he is interested in the future.   1. Scientific Purpose  Clinical research is designed to produce generalizable knowledge and to answer questions about the safety and efficacy of intervention(s) under study in order to determine whether or not they may be useful for the care of future patients.  2. Study Procedures  Participation in a trial may involve procedures or tests,  in addition to the intervention(s) under study, that are intended only or  primarily to generate scientific knowledge and that are otherwise not necessary for patient care.   3. Uncertainty  For intervention(s) under study in clinical research, there often is less knowledge and more uncertainty about the risks and benefits to a population of trial participants than there is when a doctor offers a patient standard interventions.   4. Adherence to Protocol  Administration of the intervention(s) under study is typically based on a strict protocol with defined dose, scheduling, and use or avoidance of concurrent medications, compared to administration of standard interventions.  5. Clinician as Investigator  Clinicians who are in health care settings provide treatment; in a clinical trial setting, they are also investigating safety and efficacy of an intervention. In otherwise your doctor or nurse practitioner can be wearing 2 hats - one as care giver another as Company secretary  6. Patient as Visual merchandiser Subject  Patients participating in research trials are research subjects or volunteers. In other words participating in research is 100% voluntary and at one's own free weill. The decision to participate or not participate will NOT affect patient care and the doctor-patient relationship in any way    No results found.    PFT  No flowsheet data found.    OV 05/30/2021  Subjective:  [name], male , DOB: [birth year] , age 39 y.o. , MRN: [id number] , ADDRESS: [place] [address] PCP Clinic, Scott Price Patient Care Team: Clinic, Scott Price as PCP - General Debara Pickett Nadean Corwin, MD as PCP - Cardiology (Cardiology)  This Provider for this visit: Treatment Team:  Attending Provider: Brand Males, MD    05/30/2021 -   Chief Complaint  Patient presents with   Follow-up    PFT performed today.  Pt states that he began breaking  out on his face about 2 weeks ago. States his breathing has been about the same.     [name] 76 y.o. -returns for follow-up.  He is here with his wife.  He is a little bit upset because of running 30 minutes behind.  Apologize for this delay.  He tells me respiratory wise he stable.  He had pulmonary function test that looks for fairly robust.  However in the last 3 weeks he has had rash in his face.  This whole face is red.  He also some increased erythema in his bilateral forearms.  These are sun exposed areas.  He does admit to applying sunscreen but he is on pirfenidone at the same time.  2 weeks ago he ended up in the emergency department on 05/18/2021 and was given valacyclovir for blistering in the lips but this has not helped.  He is applying Chapstick.  He is frustrated by this.  Also simultaneously since starting pirfenidone he has had altered taste with food.   We did discuss potential steroids for his rash but he wants to follow with his primary dermatologist through the [place].  He is also worried about steroids because of its negative effect with his blood sugars.     OV 08/23/2021  Subjective:  [name], male , DOB: [birth year] , age 26 y.o. , MRN: [id number] , ADDRESS: [place] [address] PCP Clinic, Scott Price Patient Care Team: Clinic, Scott Price as PCP - General Debara Pickett Nadean Corwin, MD as PCP - Cardiology (Cardiology)  This Provider for this visit: Treatment Team:  Attending Provider: Brand Males, MD  08/23/2021 -   Chief Complaint  Patient presents with   Follow-up    Follow-up  Diagnois is based on age > 68, ex smoker, progressive disease, probable UIP pattern on CT., negative serolgy and ILD qquewtionaire and acid reflux  -Start pirfenidone around Christmas 2022 - stopped 05/30/21 due to rash/taste -Significant out of proportion cough   [name] 76 y.o. -returns for follow-up.  He is no longer on  pirfenidone after stopping pirfenidone in April 2023 his dysgeusia resolved.  Rash also is resolved itching also resolved.  He did see nurse practitioner in May 2023 and by this time his rash was resolving.  Currently is not on any antifibrotic.  Overall he feels stable.  He is aware that nintedanib the other antifibrotic was considered second line for him because of the history of diverticulitis.  He tells me that has not had diverticulitis flareup in a long time.  He does not have any heart disease issues except he has pacemaker.  He has never had a heart attack.  He is aware now that nintedanib is very rarely associated with slight increase in MI risk.  Recently the mayor of [place], [place] who is the longest serving [place] in any city [place] passed away from pulmonary fibrosis.  This was a public news.  He says he is feeling sad because of that.  Wife states "it hit him hard".  Therefore he is more inclined to try nintedanib.  He wants to get this through the [facility].  In terms of symptoms he still has his cough.  He clears his throat a lot.  Albuterol helps.  We talked about over-the-counter Mucinex he is open to this idea.  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  Because of this he did not do walk test.  Thank you Uzma next patient  SYMPTOM SCALE - ILD 12/20/2020 05/30/2021 Esbriet - rash 08/23/2021 Not on any antifibroic  Current weight     O2 use ra ra ra  Shortness of Breath 0 -> 5 scale with 5 being worst (score 6 If unable to do)    At rest 2 1 2   Simple tasks - showers, clothes change, eating, shaving 3 3 3   Household (dishes, doing bed, laundry) 4 3 3   Shopping 3 3 2   Walking level at own pace 3 4 3   Walking up Stairs Does not do 4 3  Total (30-36) Dyspnea Score 15 18 16   How bad is your cough? 4 4 3  - worse at night  How bad is your fatigue 4 5 3   How bad is nausea 0 0 0  How bad is vomiting?  0 0 0  How bad is diarrhea?  1 0 0  How bad is anxiety? 5 4 0  How bad is depression 0 1 0  Any chronic pain - if so where and how bad 0  Rt knoee      Simple office walk 185 feet x  3 laps goal with forehead probe 12/20/2020  03/19/2021   O2 used ra ra  Number laps completed 3 3  Comments about pace 3   Resting Pulse Ox/HR 99% and 78/min 100 and 79  Final Pulse Ox/HR 97% and 96/min 100% and 90  Desaturated </= 88% no no  Desaturated <= 3% points no no  Got Tachycardic >/= 90/min yes yes  Symptoms at end of test Mild dyspnea Mild dyspnea  Miscellaneous comments x x     PFT     Latest Ref Rng & Units 05/30/2021    1:11 PM  PFT Results  FVC-Pre L 4.32   FVC-Predicted Pre % 107   Pre FEV1/FVC % % 83   FEV1-Pre L 3.57   FEV1-Predicted Pre % 123   DLCO uncorrected ml/min/mmHg 20.11   DLCO UNC% % 83   DLCO corrected ml/min/mmHg 20.11   DLCO COR %Predicted % 83   DLVA Predicted % 84        has a past medical history of Diabetes mellitus without complication (HCC) and High cholesterol.   reports that he quit smoking about 23 years ago. His smoking use included cigarettes. He has never used smokeless tobacco.  Past Surgical History:  Procedure Laterality Date   BACK SURGERY     LEFT HEART CATH AND CORONARY ANGIOGRAPHY N/A 11/22/2019   Procedure: LEFT HEART CATH AND CORONARY ANGIOGRAPHY;  Surgeon: Jettie Booze, MD;  Location: [facility];  Service: Cardiovascular;  Laterality: N/A;   NECK SURGERY     PACEMAKER IMPLANT N/A 12/26/2019   Procedure: PACEMAKER IMPLANT;  Surgeon: Evans Lance, MD;  Location: [facility];  Service: Cardiovascular;  Laterality: N/A;    Allergies  Allergen Reactions   Doxycycline Shortness Of Breath and Rash   Lisinopril Cough   Amoxicillin Rash   Lasix [Furosemide] Rash   Pirfenidone Rash   Prednisone     High blood sugar   Sulfa Antibiotics Rash    Headache/ Chest tightness/ stiff neck    Immunization History  Administered Date(s)  Administered   Influenza Split 11/22/2012   Influenza, High Dose Seasonal PF 01/09/2020, 11/15/2020   Moderna Sars-Covid-2 Vaccination 04/11/2019, 05/09/2019, 01/17/2020, 06/26/2020   Pneumococcal Polysaccharide-23 09/24/2020    Family History  Problem Relation Age of Onset   CAD Brother    CAD Brother      Current Outpatient Medications:    albuterol (VENTOLIN HFA) 108 (90 Base) MCG/ACT inhaler, Inhale 2 puffs into the lungs every 6 (six) hours as needed for wheezing or shortness of breath., Disp: 8 g, Rfl: 5   aspirin 81 MG EC tablet, Take 81 mg by mouth daily. , Disp: , Rfl:    Cholecalciferol (VITAMIN D3) 25 MCG (1000 UT) CAPS, Take 1,000 Units by mouth daily., Disp: , Rfl:    cyanocobalamin 1000 MCG tablet, Take 1,000 mcg by mouth daily., Disp: , Rfl:    empagliflozin (JARDIANCE) 10 MG TABS tablet, Take 5 mg by mouth in the morning and at bedtime., Disp: , Rfl:    ferrous sulfate 325 (65 FE) MG tablet, Take 325 mg by mouth daily with breakfast., Disp: , Rfl:    fluticasone furoate-vilanterol (BREO ELLIPTA) 200-25 MCG/INH AEPB, Inhale 1 puff into the lungs daily. (Patient not taking: Reported on 05/30/2021), Disp: 60 each, Rfl: 5   glipiZIDE (GLUCOTROL) 10 MG tablet, Take 5 mg by mouth 2 (two) times daily before a meal., Disp: , Rfl:    losartan (COZAAR) 25 MG tablet, Take 1 tablet (25 mg total) by mouth daily., Disp: 90 tablet, Rfl: 3   magnesium oxide (MAG-OX) 400 MG tablet, Take 400 mg by mouth 2 (two) times daily., Disp: , Rfl:    meclizine (ANTIVERT) 25 MG tablet, Take 25 mg by mouth 3 (three) times daily as needed for dizziness., Disp: , Rfl:    metFORMIN (GLUCOPHAGE) 1000 MG tablet, Take 1 tablet (1,000 mg total) by mouth in  the morning and at bedtime., Disp: , Rfl:    Multiple Vitamin (MULTI-VITAMIN) tablet, Take 1 tablet by mouth daily., Disp: , Rfl:    omeprazole (PRILOSEC) 20 MG capsule, Take 20 mg by mouth See admin instructions. Every other night, Disp: , Rfl:     rosuvastatin (CRESTOR) 40 MG tablet, Take 40 mg by mouth at bedtime., Disp: , Rfl:       Objective:   Vitals:   08/23/21 1456  BP: 110/60  Pulse: 71  Temp: 98.2 F (36.8 C)  TempSrc: Oral  SpO2: 100%  Weight: 184 lb (83.5 kg)  Height: $Remove'5\' 9"'SzGSlSr$  (1.753 m)    Estimated body mass index is 27.17 kg/m as calculated from the following:   Height as of this encounter: $RemoveBeforeD'5\' 9"'JdZDdjNTIbfzpD$  (1.753 m).   Weight as of this encounter: 184 lb (83.5 kg).  $Rem'@WEIGHTCHANGE'Duaj$ @  [employer]   08/23/21 1456  Weight: 184 lb (83.5 kg)     Physical Exam    General: No distress. Looks well Neuro: Alert and Oriented x 3. GCS 15. Speech normal Psych: Pleasant Resp:  Barrel Chest - no.  Wheeze - no, Crackles - YES base, No overt respiratory distress CVS: Normal heart sounds. Murmurs - no Ext: Stigmata of Connective Tissue Disease - no. RT knee brace HEENT: Normal upper airway. PEERL +. No post nasal drip        Assessment:       ICD-10-CM   1. IPF (idiopathic pulmonary fibrosis) (HCC)  J84.112 Pulmonary function test    Hepatic function panel    Comp Met (CMET)    B Nat Peptide    CBC w/Diff    CBC w/Diff    B Nat Peptide    Comp Met (CMET)    Hepatic function panel    CANCELED: CBC w/Diff    CANCELED: B Nat Peptide    CANCELED: Comp Met (CMET)    CANCELED: Hepatic function panel    2. Encounter for medication monitoring  Z51.81     3. Drug rash  L27.0          Plan:     Patient Instructions  Drug rash due to pirfenidone  -Glad rash and itching and taste issues resolved after stopping pirfenidone  Plan - No more pirfenidone [it already is listed as allergy]   IPF (idiopathic pulmonary fibrosis) ([place]) Encounter for medication monitoring  -IPF is clinically stable compared to last visit but you are currently not on any antifibrotic -Currently diverticulitis is not   Plan  -check cbc with diff, blood chemistry, BNP. LFT  -Start nintedanib 100 mg twice daily with food   -Our  pharmacy to try to get it for you through the [facility]  -This is a lower dose  -If you are  stable on this for a few months then we can escalate the dose  -Discussed side effect profile of diarrhea, rare diverticulitis and even rare cardiac issues but overall risk versus benefit -we have agreed to continue -Do spirometry and DLCO in 10-12 weeks  Chronic cough  Plan  - Okay for albuterol as needed - Start over-the-counter Mucinex as needed -If problems persist can consider cough recent study   Followup 6 weeks with nurse practitioner Dr. Chase Caller to ensure uptake with nintedanib is going well 10-12 weeks 30-minute visit with Dr. Chase Caller  -Symptom score and walking desaturation test at follow-up  High complex medical condition requiring intensive therapeutic monitoring  SIGNATURE    Dr. Belva Crome  Chase Caller, M.D., F.C.C.P,  Pulmonary and Critical Care Medicine Staff Physician, [place] Director - Interstitial Lung Disease  Program  Pulmonary [place] [facility] [place], [place], [address]  Pager: [phone], If no answer or between  15:00h - 7:00h: call [phone] Telephone: [phone]  3:44 PM 08/23/2021

## 2021-08-24 LAB — CBC WITH DIFFERENTIAL/PLATELET
Absolute Monocytes: 774 cells/uL (ref 200–950)
Basophils Absolute: 27 cells/uL (ref 0–200)
Basophils Relative: 0.3 %
Eosinophils Absolute: 854 cells/uL — ABNORMAL HIGH (ref 15–500)
Eosinophils Relative: 9.6 %
HCT: 41.3 % (ref 38.5–50.0)
Hemoglobin: 13.6 g/dL (ref 13.2–17.1)
Lymphs Abs: 1380 cells/uL (ref 850–3900)
MCH: 30.1 pg (ref 27.0–33.0)
MCHC: 32.9 g/dL (ref 32.0–36.0)
MCV: 91.4 fL (ref 80.0–100.0)
MPV: 10.4 fL (ref 7.5–12.5)
Monocytes Relative: 8.7 %
Neutro Abs: 5865 cells/uL (ref 1500–7800)
Neutrophils Relative %: 65.9 %
Platelets: 193 10*3/uL (ref 140–400)
RBC: 4.52 10*6/uL (ref 4.20–5.80)
RDW: 13.3 % (ref 11.0–15.0)
Total Lymphocyte: 15.5 %
WBC: 8.9 10*3/uL (ref 3.8–10.8)

## 2021-08-24 LAB — HEPATIC FUNCTION PANEL
AG Ratio: 2 (calc) (ref 1.0–2.5)
ALT: 22 U/L (ref 9–46)
AST: 20 U/L (ref 10–35)
Albumin: 4.6 g/dL (ref 3.6–5.1)
Alkaline phosphatase (APISO): 54 U/L (ref 35–144)
Bilirubin, Direct: 0.1 mg/dL (ref 0.0–0.2)
Globulin: 2.3 g/dL (calc) (ref 1.9–3.7)
Indirect Bilirubin: 0.3 mg/dL (calc) (ref 0.2–1.2)
Total Bilirubin: 0.4 mg/dL (ref 0.2–1.2)
Total Protein: 6.9 g/dL (ref 6.1–8.1)

## 2021-08-24 LAB — BRAIN NATRIURETIC PEPTIDE: Brain Natriuretic Peptide: 22 pg/mL (ref <100)

## 2021-08-24 LAB — COMPREHENSIVE METABOLIC PANEL
AG Ratio: 2 (calc) (ref 1.0–2.5)
ALT: 22 U/L (ref 9–46)
AST: 20 U/L (ref 10–35)
Albumin: 4.6 g/dL (ref 3.6–5.1)
Alkaline phosphatase (APISO): 54 U/L (ref 35–144)
BUN: 13 mg/dL (ref 7–25)
CO2: 24 mmol/L (ref 20–32)
Calcium: 9.8 mg/dL (ref 8.6–10.3)
Chloride: 103 mmol/L (ref 98–110)
Creat: 1.25 mg/dL (ref 0.70–1.28)
Globulin: 2.3 g/dL (calc) (ref 1.9–3.7)
Glucose, Bld: 146 mg/dL — ABNORMAL HIGH (ref 65–99)
Potassium: 4 mmol/L (ref 3.5–5.3)
Sodium: 140 mmol/L (ref 135–146)
Total Bilirubin: 0.4 mg/dL (ref 0.2–1.2)
Total Protein: 6.9 g/dL (ref 6.1–8.1)

## 2021-08-26 ENCOUNTER — Telehealth: Payer: Self-pay | Admitting: Pharmacist

## 2021-08-26 DIAGNOSIS — J84112 Idiopathic pulmonary fibrosis: Secondary | ICD-10-CM

## 2021-08-26 MED ORDER — OFEV 100 MG PO CAPS: 100.0000 mg | ORAL_CAPSULE | Freq: Two times a day (BID) | ORAL | 5 refills | Status: DC

## 2021-08-26 NOTE — Telephone Encounter (Signed)
Received new start paperwork for Ofev. Patient's dose will be 100mg  twice daily based on OV note. LFTs on 08/23/21 wnl  He is a 08/25/21 patient. Rx for Ofev 100mg  sent to [facility] pharmacy. Clinicals have been faxed.  ATC patient to discuss. He did not pick up phone - left VM requesting return call  , PharmD, MPH, BCPS, CPP Clinical Pharmacist (Rheumatology and Pulmonology)

## 2021-08-30 NOTE — Telephone Encounter (Signed)
ATC patient to discuss Ofev. Unable to reach - Left VM for patient regarding Ofev.  Chesley Mires, PharmD, MPH, BCPS, CPP Clinical Pharmacist (Rheumatology and Pulmonology)

## 2021-09-03 NOTE — Telephone Encounter (Signed)
ATC patient to review Ofev. Unable to reach. Tried mobile number and work phone  Chesley Mires, PharmD, MPH, BCPS, CPP Clinical Pharmacist (Rheumatology and Pulmonology)

## 2021-09-24 ENCOUNTER — Telehealth: Payer: Self-pay | Admitting: Pharmacist

## 2021-09-24 DIAGNOSIS — Z5181 Encounter for therapeutic drug level monitoring: Secondary | ICD-10-CM

## 2021-09-24 DIAGNOSIS — J84112 Idiopathic pulmonary fibrosis: Secondary | ICD-10-CM

## 2021-09-24 NOTE — Telephone Encounter (Signed)
Spoke with patient and wife to provide Ofev counseling. He received Ofev from [facility] about a week ago but hasn't started due to fear of side effects. Counseling documented in separate telephone encounter  Chesley Mires, PharmD, MPH, BCPS, CPP Clinical Pharmacist (Rheumatology and Pulmonology)

## 2021-09-24 NOTE — Telephone Encounter (Signed)
Subjective:  Patient called today by [facility] Pulmonary pharmacy team for Ofev new start counseling.   Patient was last seen by Dr. Chase Caller on 08/23/21. Pertinent past medical history includes HTN, unstable angina, diverticulitis. He has a pacemaker. He was previously taking Esbriet- started in December 2022. He stopped this due to loss of taste and development of rash.  Patient received one bottle of Ofev from pharmacy a week ago. He reports that he has yet to start the medication. He states he is reluctant to start due to fear of side effects.  History of CAD: Yes History of MI: No Current anticoagulant use: No History of HTN: Yes  History of elevated LFTs: No History of diarrhea, nausea, vomiting: No  Objective: Allergies  Allergen Reactions   Doxycycline Shortness Of Breath and Rash   Lisinopril Cough   Amoxicillin Rash   Lasix [Furosemide] Rash   Pirfenidone Rash   Prednisone     High blood sugar   Sulfa Antibiotics Rash    Headache/ Chest tightness/ stiff neck    Outpatient Encounter Medications as of 08/26/2021  Medication Sig   Nintedanib (OFEV) 100 MG CAPS Take 1 capsule (100 mg total) by mouth 2 (two) times daily. Clinicals have been faxed   albuterol (VENTOLIN HFA) 108 (90 Base) MCG/ACT inhaler Inhale 2 puffs into the lungs every 6 (six) hours as needed for wheezing or shortness of breath.   aspirin 81 MG EC tablet Take 81 mg by mouth daily.    Cholecalciferol (VITAMIN D3) 25 MCG (1000 UT) CAPS Take 1,000 Units by mouth daily.   cyanocobalamin 1000 MCG tablet Take 1,000 mcg by mouth daily.   empagliflozin (JARDIANCE) 10 MG TABS tablet Take 5 mg by mouth in the morning and at bedtime.   ferrous sulfate 325 (65 FE) MG tablet Take 325 mg by mouth daily with breakfast.   fluticasone furoate-vilanterol (BREO ELLIPTA) 200-25 MCG/INH AEPB Inhale 1 puff into the lungs daily. (Patient not taking: Reported on 05/30/2021)   glipiZIDE (GLUCOTROL) 10 MG tablet Take 5 mg by mouth 2  (two) times daily before a meal.   losartan (COZAAR) 25 MG tablet Take 1 tablet (25 mg total) by mouth daily.   magnesium oxide (MAG-OX) 400 MG tablet Take 400 mg by mouth 2 (two) times daily.   meclizine (ANTIVERT) 25 MG tablet Take 25 mg by mouth 3 (three) times daily as needed for dizziness.   metFORMIN (GLUCOPHAGE) 1000 MG tablet Take 1 tablet (1,000 mg total) by mouth in the morning and at bedtime.   Multiple Vitamin (MULTI-VITAMIN) tablet Take 1 tablet by mouth daily.   omeprazole (PRILOSEC) 20 MG capsule Take 20 mg by mouth See admin instructions. Every other night   rosuvastatin (CRESTOR) 40 MG tablet Take 40 mg by mouth at bedtime.   No facility-administered encounter medications on file as of 08/26/2021.     Immunization History  Administered Date(s) Administered   Influenza Split 11/22/2012   Influenza, High Dose Seasonal PF 01/09/2020, 11/15/2020   Moderna Sars-Covid-2 Vaccination 04/11/2019, 05/09/2019, 01/17/2020, 06/26/2020   Pneumococcal Polysaccharide-23 09/24/2020      PFT's No results found for: "FEV1", "FVC", "FEV1FVC", "TLC", "DLCO"    CMP     Component Value Date/Time   NA 140 08/23/2021 1543   NA 142 04/27/2020 1559   K 4.0 08/23/2021 1543   CL 103 08/23/2021 1543   CO2 24 08/23/2021 1543   GLUCOSE 146 (H) 08/23/2021 1543   BUN 13 08/23/2021 1543  BUN 20 04/27/2020 1559   CREATININE 1.25 08/23/2021 1543   CALCIUM 9.8 08/23/2021 1543   PROT 6.9 08/23/2021 1543   PROT 6.9 08/23/2021 1543   ALBUMIN 4.6 05/30/2021 1629   AST 20 08/23/2021 1543   AST 20 08/23/2021 1543   ALT 22 08/23/2021 1543   ALT 22 08/23/2021 1543   ALKPHOS 58 05/30/2021 1629   BILITOT 0.4 08/23/2021 1543   BILITOT 0.4 08/23/2021 1543   GFRNONAA 57 (L) 12/14/2019 0941   GFRNONAA >60 11/19/2019 1217   GFRAA 65 12/14/2019 0941      CBC    Component Value Date/Time   WBC 8.9 08/23/2021 1543   RBC 4.52 08/23/2021 1543   HGB 13.6 08/23/2021 1543   HGB 11.9 (L) 12/21/2019  1004   HCT 41.3 08/23/2021 1543   HCT 36.1 (L) 12/21/2019 1004   PLT 193 08/23/2021 1543   PLT 203 12/21/2019 1004   MCV 91.4 08/23/2021 1543   MCV 91 12/21/2019 1004   MCH 30.1 08/23/2021 1543   MCHC 32.9 08/23/2021 1543   RDW 13.3 08/23/2021 1543   RDW 13.9 12/21/2019 1004   LYMPHSABS 1,380 08/23/2021 1543   LYMPHSABS 1.5 12/21/2019 1004   MONOABS 0.7 05/30/2021 1629   EOSABS 854 (H) 08/23/2021 1543   EOSABS 0.5 (H) 12/21/2019 1004   BASOSABS 27 08/23/2021 1543   BASOSABS 0.0 12/21/2019 1004      LFT's    Latest Ref Rng & Units 08/23/2021    3:43 PM 05/30/2021    4:29 PM 04/16/2021   11:18 AM  Hepatic Function  Total Protein 6.1 - 8.1 g/dL 6.1 - 8.1 g/dL 6.9    6.9  7.2  6.8   Albumin 3.5 - 5.2 g/dL  4.6  4.6   AST 10 - 35 U/L 10 - 35 U/L _0 ALT 9 - 46 U/L 9 - 46 U/L _1 Alk Phosphatase 39 - 117 U/L  58  65   Total Bilirubin 0.2 - 1.2 mg/dL 0.2 - 1.2 mg/dL 0.4    0.4  0.4  0.3   Bilirubin, Direct 0.0 - 0.2 mg/dL 0.1  0.1  0.1       HRCT (11/28/20) - Progressively worsening interstitial lung disease, 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  Assessment and Plan  Ofev Medication Management Thoroughly counseled patient on the efficacy, mechanism of action, dosing, administration, adverse effects, and monitoring parameters of Ofev. Patient verbalized understanding.   Patient states he will plan to start the Ofev but did not feel ready last week  Goals of Therapy: Will not stop or reverse the progression of ILD. It will slow the progression of ILD.  Inhibits tyrosine kinase inhibitors which slow the fibrosis/progression of ILD -Significant reduction in the rate of disease progression was observed after treatment (61.1% [before] vs 33.3% [after], P?=?0.008) over 42 weeks.  Dosing: 100 mg (one capsule) by mouth twice daily (approx 12 hours apart). Discussed  taking with food approximately 12 hours apart. Discussed that capsule should not be crushed or split.  Adverse Effects: Nausea, vomiting, diarrhea (2 in 3 patients) appetite loss, weight loss - management of diarrhea with loperamide discussed. Also reviewed that diarrhea is self-resolving after several days with discontinuation of medication. Reviewed that he will be on lower dose of Ofev to start out, hopefully build up  tolerability, and minimize risk for diarrhea. Abdominal pain (up to 1 in 5 patients) Risk of thrombosis (3%) and acute MI (2%) Dizziness Fatigue (10%)  Monitoring: Monitor for diarrhea, nausea and vomiting, GI perforation, hepatotoxicity  Monitor LFTs - baseline, monthly for first 6 months, then every 3 months routinely CBC w differential at baseline and every 3 months routinely Patient would like labwork completed at [facility] in [place]. He is aware that he should reach out to our clinic when he is ready for labs to be sent to [place].  Access: Approval of Ofev through: insurance Rx sent to: [facility] on 08/26/21  Medication Reconciliation A drug regimen assessment was performed, including review of allergies, interactions, disease-state management, dosing and immunization history. Medications were reviewed with the patient, including name, instructions, indication, goals of therapy, potential side effects, importance of adherence, and safe use.  Anticoagulant use: NO   Thank you for involving pharmacy to assist in providing this patient's care.   Knox Saliva, PharmD, MPH, BCPS, CPP Clinical Pharmacist (Rheumatology and Pulmonology)

## 2021-10-04 NOTE — Telephone Encounter (Signed)
Scott Price/  He started ofev end aug 2023. Needs LFT check end Sept 2023. I am not seeing him  till Oct 2023

## 2021-10-08 NOTE — Telephone Encounter (Signed)
Attempted to call pt but unable to reach. Left message for him to return call so we can make sure he knows to come at the end of this month for bloodwork.

## 2021-10-13 ENCOUNTER — Other Ambulatory Visit: Payer: Self-pay | Admitting: Internal Medicine

## 2021-10-14 NOTE — Telephone Encounter (Signed)
Attempted to call pt but unable to reach. Left message for him to return call. Did state in the message that we were needing him to come to office end September 2023 for bloodwork and told him to return call to let us know he was aware.

## 2021-11-01 ENCOUNTER — Ambulatory Visit (INDEPENDENT_AMBULATORY_CARE_PROVIDER_SITE_OTHER): Payer: Medicare HMO

## 2021-11-01 DIAGNOSIS — I442 Atrioventricular block, complete: Secondary | ICD-10-CM | POA: Diagnosis not present

## 2021-11-01 LAB — CUP PACEART REMOTE DEVICE CHECK
Battery Remaining Longevity: 132 mo
Battery Remaining Percentage: 100 %
Brady Statistic RA Percent Paced: 6 %
Brady Statistic RV Percent Paced: 100 %
Date Time Interrogation Session: 20230928132400
Lead Channel Impedance Value: 589 Ohm
Lead Channel Impedance Value: 671 Ohm
Lead Channel Pacing Threshold Amplitude: 0.5 V
Lead Channel Pacing Threshold Amplitude: 0.9 V
Lead Channel Pacing Threshold Pulse Width: 0.4 ms
Lead Channel Pacing Threshold Pulse Width: 0.4 ms
Lead Channel Setting Pacing Amplitude: 2 V
Lead Channel Setting Pacing Amplitude: 2.5 V
Lead Channel Setting Pacing Pulse Width: 0.4 ms
Lead Channel Setting Sensing Sensitivity: 2.5 mV
Pulse Gen Serial Number: 962270

## 2021-11-04 ENCOUNTER — Other Ambulatory Visit: Payer: Self-pay | Admitting: Pharmacist

## 2021-11-04 DIAGNOSIS — Z5181 Encounter for therapeutic drug level monitoring: Secondary | ICD-10-CM

## 2021-11-04 DIAGNOSIS — J84112 Idiopathic pulmonary fibrosis: Secondary | ICD-10-CM

## 2021-11-04 NOTE — Progress Notes (Signed)
Patient's wife called stating patient will be needing labs at [facility] in [place]. CMET lab order released today. Left VM with patient's wife to advise.  Knox Saliva, PharmD, MPH, BCPS, CPP Clinical Pharmacist (Rheumatology and Pulmonology)

## 2021-11-06 LAB — COMPREHENSIVE METABOLIC PANEL
ALT: 21 IU/L (ref 0–44)
AST: 26 IU/L (ref 0–40)
Albumin/Globulin Ratio: 2.4 — ABNORMAL HIGH (ref 1.2–2.2)
Albumin: 4.6 g/dL (ref 3.8–4.8)
Alkaline Phosphatase: 66 IU/L (ref 44–121)
BUN/Creatinine Ratio: 13 (ref 10–24)
BUN: 14 mg/dL (ref 8–27)
Bilirubin Total: 0.4 mg/dL (ref 0.0–1.2)
CO2: 22 mmol/L (ref 20–29)
Calcium: 9.5 mg/dL (ref 8.6–10.2)
Chloride: 100 mmol/L (ref 96–106)
Creatinine, Ser: 1.05 mg/dL (ref 0.76–1.27)
Globulin, Total: 1.9 g/dL (ref 1.5–4.5)
Glucose: 262 mg/dL — ABNORMAL HIGH (ref 70–99)
Potassium: 4.2 mmol/L (ref 3.5–5.2)
Sodium: 139 mmol/L (ref 134–144)
Total Protein: 6.5 g/dL (ref 6.0–8.5)
eGFR: 73 mL/min/{1.73_m2} (ref >59)

## 2021-11-06 NOTE — Progress Notes (Signed)
Remote pacemaker transmission.   

## 2021-11-28 ENCOUNTER — Ambulatory Visit (INDEPENDENT_AMBULATORY_CARE_PROVIDER_SITE_OTHER): Payer: No Typology Code available for payment source | Admitting: Internal Medicine

## 2021-11-28 ENCOUNTER — Encounter: Payer: Self-pay | Admitting: Internal Medicine

## 2021-11-28 VITALS — BP 114/58 | HR 70 | Temp 97.7°F | Ht 69.0 in | Wt 181.0 lb

## 2021-11-28 DIAGNOSIS — Z5181 Encounter for therapeutic drug level monitoring: Secondary | ICD-10-CM | POA: Diagnosis not present

## 2021-11-28 DIAGNOSIS — J84112 Idiopathic pulmonary fibrosis: Secondary | ICD-10-CM

## 2021-11-28 DIAGNOSIS — R053 Chronic cough: Secondary | ICD-10-CM

## 2021-11-28 LAB — CBC WITH DIFFERENTIAL/PLATELET
Basophils Absolute: 0.1 10*3/uL (ref 0.0–0.1)
Basophils Relative: 0.7 % (ref 0.0–3.0)
Eosinophils Absolute: 0.5 10*3/uL (ref 0.0–0.7)
Eosinophils Relative: 6.5 % — ABNORMAL HIGH (ref 0.0–5.0)
HCT: 40.1 % (ref 39.0–52.0)
Hemoglobin: 13.1 g/dL (ref 13.0–17.0)
Lymphocytes Relative: 14.1 % (ref 12.0–46.0)
Lymphs Abs: 1.1 10*3/uL (ref 0.7–4.0)
MCHC: 32.7 g/dL (ref 30.0–36.0)
MCV: 91.4 fl (ref 78.0–100.0)
Monocytes Absolute: 0.7 10*3/uL (ref 0.1–1.0)
Monocytes Relative: 8.9 % (ref 3.0–12.0)
Neutro Abs: 5.6 10*3/uL (ref 1.4–7.7)
Neutrophils Relative %: 69.8 % (ref 43.0–77.0)
Platelets: 197 10*3/uL (ref 150.0–400.0)
RBC: 4.38 Mil/uL (ref 4.22–5.81)
RDW: 15.1 % (ref 11.5–15.5)
WBC: 8.1 10*3/uL (ref 4.0–10.5)

## 2021-11-28 LAB — PULMONARY FUNCTION TEST
DL/VA % pred: 75 %
DL/VA: 2.97 ml/min/mmHg/L
DLCO cor % pred: 76 %
DLCO cor: 18.43 ml/min/mmHg
DLCO unc % pred: 76 %
DLCO unc: 18.43 ml/min/mmHg
FEF 25-75 Pre: 3.99 L/sec
FEF2575-%Pred-Pre: 196 %
FEV1-%Pred-Pre: 121 %
FEV1-Pre: 3.48 L
FEV1FVC-%Pred-Pre: 112 %
FEV6-%Pred-Pre: 113 %
FEV6-Pre: 4.23 L
FEV6FVC-%Pred-Pre: 106 %
FVC-%Pred-Pre: 107 %
FVC-Pre: 4.27 L
Pre FEV1/FVC ratio: 81 %
Pre FEV6/FVC Ratio: 100 %

## 2021-11-28 LAB — HEPATIC FUNCTION PANEL
ALT: 23 U/L (ref 0–53)
AST: 25 U/L (ref 0–37)
Albumin: 4.8 g/dL (ref 3.5–5.2)
Alkaline Phosphatase: 46 U/L (ref 39–117)
Bilirubin, Direct: 0.1 mg/dL (ref 0.0–0.3)
Total Bilirubin: 0.4 mg/dL (ref 0.2–1.2)
Total Protein: 7.5 g/dL (ref 6.0–8.3)

## 2021-11-28 LAB — NITRIC OXIDE: FeNO level (ppb): 13

## 2021-11-28 MED ORDER — FLUTICASONE FUROATE-VILANTEROL 100-25 MCG/ACT IN AEPB: 1.0000 | INHALATION_SPRAY | Freq: Every day | RESPIRATORY_TRACT | 5 refills | Status: DC

## 2021-11-28 NOTE — Patient Instructions (Signed)
Spirometry and DLCO Performed Today.  

## 2021-11-28 NOTE — Patient Instructions (Addendum)
   IPF (idiopathic pulmonary fibrosis) ([place]) Encounter for medication monitoring  -IPF is clinically stable compared to last visit  though the slight reduction in DLCO is concerning - Tolerting  month 2 of low dose ofev well (Start sept 2023)   Plan  -check LFT 11/28/2021 - if not already done today -Continue nintedanib 100 mg twice daily with food   -Our pharmacy to try to get it for you through the [facility]  -This is a lower dose  -If you are  stable on this through spring 2024 then we can escalate the dose  -Discussed side effect profile of diarrhea, rare diverticulitis and even rare cardiac issues but overall risk versus benefit -we have agreed to continue -Do spirometry and DLCO in 12 weeks - RSV vaccine in fall  Chronic cough  ? Partly due to season allergies +.- cough variant asthma; prior blood eosinophil high (prior breo worked early 2023)  Plan - do IgE, cbc with diff and RAST allergy profile  - Okay for albuterol as needed - restart breo but 119mcg dose 1 puff daily  - albuterol as needed - Start over-the-counter Mucinex as needed -If problems persist can consider cough recent study   Followup 12 weeks 30-minute visit with Dr. Chase Caller but after PFT  -Symptom score and walking desaturation test at follow-up

## 2021-11-28 NOTE — Progress Notes (Signed)
IOV august 2022 for SOB by [facility]  Subjective:   [name]: male DOB: 05/17/1945, MRN: [id number]  Chief Complaint  Patient presents with   Consult    Pt is being referred due to chronic cough.  Pt states that he does have complaints of chest tightness and SOB with activities.      PMH DMII, high cholesterol. He has recurrent issues with chest tightness. Dry cough, usually non-productive. Occasionally in the morning her has productive cough. He has gerd and takes a PPI. If he does any activity it makes the episodes worse. Hot shower and activity makes it worse. No seasonal changes that he notices. He does have sinus issues at times. Retired at a Agricultural consultant. Was in [place], [place] 1966 - 1971. No pets in the house. PFTS at [facility] ratio normal, FEV1 2L, normal dlco, normal tlc. He stays active and still working part time. He has used albuterol in the past prior to getting his pacemaker.   CT scan of the chest was completed in November 2021 which revealed lower lobe honeycombing and early evidence of interstitial lung disease. xxxxxx  OV 11/23/2020: here to day for follow up. Unfortunately he did not have his CT Chest complete. It was set up through the [place] and he was not prepared to pay for it. His cough is better. His chest tightness is better as well.  Has been using his inhalers.  OV 12/20/2020 -transfer of care to the ILD center to Dr. Chase Caller.  Referred by Dr. June Leap  Subjective:  [name], male , DOB: Nov 30, 1945 , age 76 y.o. , MRN: [id number] , ADDRESS: [place] [address] [facility]CP [facility] Patient Care Team: [facility] as PCP - General Debara Pickett Nadean Corwin, MD as PCP - Cardiology (Cardiology)  This Provider for this visit: Treatment Team:  Attending Provider: Brand Males, MD    12/20/2020 -   Chief Complaint  Patient presents with   Follow-up    Chest  tightness     [name] 76 y.o. -seen in August 2022 in October 2022 by Dr. Valeta Harms.  He is here with his wife.  He is a English as a second language teacher. Dr Gwenette Greet at the [facility] in the area is retired.  Therefore he is seeing [place].  He reports insidious onset of shortness of breath at least a year.  This started around the time he had his pacemaker.  The pacemaker did get rid of his syncope but not the shortness of breath.  Is present on exertion relieved by rest.  Recently Dr. Valeta Harms started [facility] and this is helped his chest tightness on exertion but he still continues have significant shortness of breath with exertion and cough.  Documented below.  He is aware that he has pulmonary fibrosis.  His symptoms are somewhat progressive.  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  Shared with the family his results.  Review of the records indicate that he has not had serology so far.  An ILD questionnaire packet was mailed out to him but he has not received it yet so we do not have the data.  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  His other medications include Breo and omeprazole.  He has a history of diverticulitis.  No coronary artery disease but he has a pacemaker.  He is not on anticoagulation  he is on aspirin only.  His walking desaturation test shows adequate oxygenation with minimal pulse ox drop.   [facility] Comprehensive ILD Questionnaire -dictated on 03/19/2021  Symptoms:  x    Past Medical History :  -Denies any asthma or COPD or heart failure.  Denies collagen vascular disease..  Denies sleep apnea -Does have acid reflux -Does have diabetes.  Intolerant to prednisone.  Makes him very hyperglycemic -Intolerant to gabapentin -Never had COVID   ROS:  -Does have exertional fatigue -10 pound weight loss prior to the November 2022 consult -Dry eyes present -Does have occasional acid reflux which is fairly well controlled with  omeprazole  FAMILY HISTORY of LUNG DISEASE:  -Mom had asthma  PERSONAL EXPOSURE HISTORY:  -Smoke cigarettes stop smoking 23 years ago.  Never did vaping in the past smoked some amount of marijuana but not much.  No cocaine use no intravenous drug use  HOME  EXPOSURE and HOBBY DETAILS :  -Lives in the urban house since 1997.  The house was 77 years old.  No dampness no mold or mildew.  Does not use CPAP.  Extensive organic antigen exposure history is negative  OCCUPATIONAL HISTORY (122 questions) : -He is a veteran but no agent orange exposure.  Has done gardening.  Has done woodwork.  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  Has done a garage repair.  Has done furniture work.  Has done some car Field seismologist work.  Has done machine operating.  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  Intolerant to prednisone. -For postnasal drip has tried nasal spray saline and steroid nasal spray and it does not work  INVESTIGATIONS: -Below   CT Chest data - HRCT 11/27/20  Study Result  Narrative & Impression  CLINICAL DATA:  76 year old male with history of interstitial lung disease. Follow-up study.   EXAM: CT CHEST WITHOUT CONTRAST   TECHNIQUE: Multidetector CT imaging of the chest was performed following the standard protocol without intravenous contrast. High resolution imaging of the lungs, as well as inspiratory and expiratory imaging, was performed.   COMPARISON:  Chest CT 12/19/2019.   FINDINGS: Cardiovascular: Heart size is normal. There is no significant pericardial fluid, thickening or pericardial calcification. There is aortic atherosclerosis, as well as atherosclerosis of the great vessels of the mediastinum and the coronary arteries, including calcified atherosclerotic plaque in the left main, left anterior descending, left circumflex and right coronary  arteries. Severe calcifications of the aortic valve and mitral annulus. Left-sided pacemaker device in place with lead tips terminating in the right atrial appendage and right ventricular apex.   Mediastinum/Nodes: No pathologically enlarged mediastinal or hilar lymph nodes. Please note that accurate exclusion of hilar adenopathy is limited on noncontrast CT scans. Esophagus is unremarkable in appearance. No axillary lymphadenopathy.   Lungs/Pleura: High-resolution images demonstrate widespread but patchy areas of peripheral predominant ground-glass attenuation, septal thickening, subpleural reticulation, cylindrical bronchiectasis and peripheral bronchiolectasis. Findings appear progressive compared to the prior study. Findings have a definitive craniocaudal gradient. No frank honeycombing is confidently identified at this time. Inspiratory and expiratory imaging demonstrates some very mild air trapping in the lung bases indicative of mild small airways disease. No acute consolidative airspace disease. No pleural effusions. No suspicious appearing pulmonary nodules or masses are noted.   Upper Abdomen: Aortic atherosclerosis.   Musculoskeletal: Orthopedic fixation hardware in the lower cervical spine incidentally  noted. There are no aggressive appearing lytic or blastic lesions noted in the visualized portions of the skeleton.   IMPRESSION: 1. Progressively worsening interstitial lung disease, 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 2. Aortic atherosclerosis, in addition to left main and 3 vessel coronary artery disease. Assessment for potential risk factor modification, dietary therapy or pharmacologic therapy may be warranted, if clinically indicated. 3. There are calcifications of the aortic valve and mitral annulus. Echocardiographic correlation for evaluation of potential valvular dysfunction may be  warranted if clinically indicated.   Aortic Atherosclerosis (ICD10-I70.0).     Electronically Signed   By: Vinnie Langton M.D.   On: 11/28/2020 12:46    No results found.  Cardoiac cath  oct 2021 Mid RCA lesion is 25% stenosed. Prox Cx to Mid Cx lesion is 25% stenosed. Prox LAD to Mid LAD lesion is 25% stenosed. The left ventricular systolic function is normal. LV end diastolic pressure is normal. The left ventricular ejection fraction is 55-65% by visual estimate. There is no aortic valve stenosis.   Nonobstructive CAD.  Continue preventive therapy  PFT  No flowsheet data found.   Latest Reference Range & Units 11/19/19 12:17  Creatinine 0.61 - 1.24 mg/dL 1.00    Latest Reference Range & Units 11/19/19 12:17  Hemoglobin 13.0 - 17.0 g/dL 14.1       OV 02/08/2021  Subjective:  [name], male , DOB: Jan 25, 1946 , age 17 y.o. , MRN: [id number] , ADDRESS: [place] [address] [facility]CP [facility] Patient Care Team: [facility] as PCP - General Debara Pickett Nadean Corwin, MD as PCP - Cardiology (Cardiology)  This Provider for this visit: Treatment Team:  Attending Provider: Brand Males, MD  Type of visit:Video Circumstance: COVID-19 national emergency Identification of patient DEVEON KISIEL with 09/02/1945 and MRN [id number] - 2 person identifier Risks: Risks, benefits, limitations of telephone visit explained. Patient understood and verbalized agreement to proceed Anyone else on call: his wife on side Patient location: [place] [address] This provider location: [address], [address]; [place]; [place] [address]. [facility]. [phone]    02/08/2021 - clinical IPF followup - to assess esbriet uptake    [name] 76 y.o. - only on 2nd week of esbreit. On 2 pills tid. Sunday 02/10/21 ends 2pills tid. On Monday 02/11/21 will go to 3 pills tid. No side effects. Spacing 5-6h apart .  Does not wear sunscreen bu wears hat and long sleeve clothes. Does have sunscren. Reviewd serology and cbc/chmeistry - these are normal  However, reports chest congestion. Hacks and coughs. Sputum +. Says it is baseline few months ago. Rates  it as 4-5 of 5.Does not know sputum color - but thinks it is clear. Not wheezing. Clears throat. fEels he says he cannot take prednisone - even 5 days - drives him hyperglucemic 400s  AUTOIMMUNE 12/20/20   Latest Reference Range & Units 11/19/19 12:17 12/14/19 09:41 04/27/20 15:59 12/20/20 15:13  Sed Rate 0 - 20 mm/hr    7  Glucose 70 - 99 mg/dL 156 (H) 207 (H) 157 (H) 166 (H)  Anti Nuclear Antibody (ANA) NEGATIVE     NEGATIVE  Angiotensin-Converting Enzyme 9 - 67 U/L    20  ds DNA Ab IU/mL    <1  RA Latex Turbid. <14 IU/mL    <14  SSA (Ro) (  ENA) Antibody, IgG <1.0 NEG AI    <1.0 NEG  SSB (La) (ENA) Antibody, IgG <1.0 NEG AI    <1.0 NEG  Scleroderma (Scl-70) (ENA) Antibody, IgG <1.0 NEG AI    <1.0 NEG  QUANTIFERON-TB GOLD PLUS     Rpt  (H): Data is abnormally high Rpt: View report in Results Review for more information   PFT  No flowsheet data found  OV 03/19/2021  Subjective:  [name], male , DOB: January 10, 1946 , age 52 y.o. , MRN: [id number] , ADDRESS: [place] [address] [facility]CP [facility] Patient Care Team: [facility] as PCP - General Debara Pickett Nadean Corwin, MD as PCP - Cardiology (Cardiology)  This Provider for this visit: Treatment Team:  Attending Provider: Brand Males, MD    03/19/2021 -   Chief Complaint  Patient presents with   Follow-up    Pt states he has been doing okay since last visit. States he does still become SOB with exertion and still has complaints of coughing that is worse in the mornings.   Diagnois is based on age > 27, ex smoker, progressive disease, probable UIP pattern on CT., negative serolgy and ILD qquewtionaire and acid reflux  -Start pirfenidone  around Christmas 2022 -Significant out of proportion cough  [name] 76 y.o. -returns for follow-up.  I reviewed his ILD questionnaire and his exposures consistent with IPF.  Currently he is on pirfenidone.  He is now on 3 pills 3 times daily for at least a month.  He says ever since going to 3 pills 3 times daily he has occasional mild acid reflux which his wife also test.  He feels a heartburn.  It is mild and tolerable.  He does take omeprazole but he is taking it with food as opposed to empty stomach.  There is no nausea.  No vomiting no diarrhea.  He feels his weight is stable.  Overall he is pleased with his Esbriet tolerance.  His chronic symptoms of shortness of breath and cough are stable although the cough is the most significant of symptoms.  He rates it as a 3 out of 5.  It is worse early in the morning than the rest of the day.  There is clear sputum.  Last visit I prescribed Robitussin which is helping him.  I also suggested he take Tessalon but he he is unable to afford a $66 co-pay.  We discussed gabapentin but this makes him sleepy from a previous neuropathic experience.  We discussed prednisone but this makes him hyperglycemic he does not want to do it.  We discussed opioids but he does not want to do that either.  He is on chronic Afrin nasal spray.  We discussed taking saline nasal spray or nasal steroids for his sinus drainage but he feels those things do not help.  Nevertheless he is willing to try it.  He is going to have a liver function test today  We discussed participation in clinical trials and he is interested in the future.   1. Scientific Purpose  Clinical research is designed to produce generalizable knowledge and to answer questions about the safety and efficacy of intervention(s) under study in order to determine whether or not they may be useful for the care of future patients.  2. Study Procedures  Participation in a trial may involve procedures or tests,  in addition to the intervention(s) under study, that are intended only or  primarily to generate scientific knowledge and that are otherwise not necessary for patient care.   3. Uncertainty  For intervention(s) under study in clinical research, there often is less knowledge and more uncertainty about the risks and benefits to a population of trial participants than there is when a doctor offers a patient standard interventions.   4. Adherence to Protocol  Administration of the intervention(s) under study is typically based on a strict protocol with defined dose, scheduling, and use or avoidance of concurrent medications, compared to administration of standard interventions.  5. Clinician as Investigator  Clinicians who are in health care settings provide treatment; in a clinical trial setting, they are also investigating safety and efficacy of an intervention. In otherwise your doctor or nurse practitioner can be wearing 2 hats - one as care giver another as Company secretary  6. Patient as Visual merchandiser Subject  Patients participating in research trials are research subjects or volunteers. In other words participating in research is 100% voluntary and at one's own free weill. The decision to participate or not participate will NOT affect patient care and the doctor-patient relationship in any way    No results found.    PFT  No flowsheet data found.    OV 05/30/2021  Subjective:  [name], male , DOB: [birth year] , age 39 y.o. , MRN: [id number] , ADDRESS: [place] [address] [facility]CP [facility] Patient Care Team: [facility] as PCP - General Debara Pickett Nadean Corwin, MD as PCP - Cardiology (Cardiology)  This Provider for this visit: Treatment Team:  Attending Provider: Brand Males, MD    05/30/2021 -   Chief Complaint  Patient presents with   Follow-up    PFT performed today.  Pt states that he began breaking  out on his face about 2 weeks ago. States his breathing has been about the same.     [name] 76 y.o. -returns for follow-up.  He is here with his wife.  He is a little bit upset because of running 30 minutes behind.  Apologize for this delay.  He tells me respiratory wise he stable.  He had pulmonary function test that looks for fairly robust.  However in the last 3 weeks he has had rash in his face.  This whole face is red.  He also some increased erythema in his bilateral forearms.  These are sun exposed areas.  He does admit to applying sunscreen but he is on pirfenidone at the same time.  2 weeks ago he ended up in the emergency department on 05/18/2021 and was given valacyclovir for blistering in the lips but this has not helped.  He is applying Chapstick.  He is frustrated by this.  Also simultaneously since starting pirfenidone he has had altered taste with food.   We did discuss potential steroids for his rash but he wants to follow with his primary dermatologist through the [place].  He is also worried about steroids because of its negative effect with his blood sugars.     OV 08/23/2021  Subjective:  [name], male , DOB: [birth year] , age 26 y.o. , MRN: [id number] , ADDRESS: [place] [address] [facility]CP [facility] Patient Care Team: [facility] as PCP - General Debara Pickett Nadean Corwin, MD as PCP - Cardiology (Cardiology)  This Provider for this visit: Treatment Team:  Attending Provider: Brand Males, MD  08/23/2021 -   Chief Complaint  Patient presents with   Follow-up    Follow-up   HPI TREVARIS PENNELLA 76 y.o. -returns for follow-up.  He is no longer on pirfenidone after stopping pirfenidone in April 2023 his dysgeusia resolved.  Rash also is resolved itching also resolved.  He did see nurse practitioner in May 2023 and by this time his rash was resolving.  Currently is not on any antifibrotic.  Overall he feels stable.   He is aware that nintedanib the other antifibrotic was considered second line for him because of the history of diverticulitis.  He tells me that has not had diverticulitis flareup in a long time.  He does not have any heart disease issues except he has pacemaker.  He has never had a heart attack.  He is aware now that nintedanib is very rarely associated with slight increase in MI risk.  Recently the mayor of [place] who is the longest serving mayor in any city [place] passed away from pulmonary fibrosis.  This was a public news.  He says he is feeling sad because of that.  Wife states "it hit him hard".  Therefore he is more inclined to try nintedanib.  He wants to get this through the [facility].  In terms of symptoms he still has his cough.  He clears his throat a lot.  Albuterol helps.  We talked about over-the-counter Mucinex he is open to this idea.  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  Because of this he did not do walk test.    OV 11/28/2021  Subjective:  [name], male , DOB: Jun 03, 1945 , age 37 y.o. , MRN: [id number] , ADDRESS: [address] [place] [address] [facility]CP [facility] Patient Care Team: [facility] as PCP - General Rennis Golden Lisette Abu, MD as PCP - Cardiology (Cardiology)  This Provider for this visit: Treatment Team:  Attending Provider: Kalman Shan, MD  Diagnois is based on age > 43, ex smoker, progressive disease, probable UIP pattern on CT., negative serolgy and ILD qquewtionaire and acid reflux  -Start pirfenidone around Christmas 2022 - stopped 05/30/21 due to rash/taste  -Started low-dose nintedanib 100 mg twice daily September 2023  -Significant out of proportion cough -  -Associated with elevated blood eosinophils in 2023  -Mom with asthma  -2023 response to Breo positively    11/28/2021 -   Chief Complaint  Patient presents with   Office Visit     Had PFT today PT states no changes since last visit     [name] 76 y.o. -for follow-up.  Presents with his wife.  He is currently on a second bottle of Ofev 100 mg twice daily.  He is tolerating it well without any GI symptoms.  His symptoms are stable.  His pulmonary function test is stable except for very mild reduction in DLCO which could be technical variation.  He has no change in his baseline symptoms other than the fact he has got cough that is still present it is mild to moderate in severity and annoying.  Review of the records indicate that his mom had asthma and earlier in the year he did feel better with the chest tightness and some cough with Breo.  Review of the labs indicate that he is all very had elevated blood eosinophils 5 cells per cubic millimeter in 2023.  He  has never had allergy testing but he does report seasonal variation with a cough.    SYMPTOM SCALE - ILD 12/20/2020 05/30/2021 Esbriet - rash 08/23/2021 Not on any antifibroic 11/28/2021 Ofev 100mg  bid  Current weight      O2 use ra ra ra ra  Shortness of Breath 0 -> 5 scale with 5 being worst (score 6 If unable to do)     At rest 2 1 2 1   Simple tasks - showers, clothes change, eating, shaving 3 3 3 2   Household (dishes, doing bed, laundry) 4 3 3 1   Shopping 3 3 2 2   Walking level at own pace 3 4 3 3   Walking up Stairs Does not do 4 3 3   Total (30-36) Dyspnea Score 15 18 16 13   How bad is your cough? 4 4 3  - worse at night 3  How bad is your fatigue 4 5 3 4   How bad is nausea 0 0 0 0  How bad is vomiting?  0 0 0 0  How bad is diarrhea? 1 0 0 0  How bad is anxiety? 5 4 0 0  How bad is depression 0 1 0 0  Any chronic pain - if so where and how bad 0  Rt knoee 0      Simple office walk 185 feet x  3 laps goal with forehead probe 12/20/2020  03/19/2021  11/28/2021   O2 used ra ra   Number laps completed 3 3   Comments about pace 3    Resting Pulse Ox/HR 99% and 78/min 100 and 79   Final  Pulse Ox/HR 97% and 96/min 100% and 90   Desaturated </= 88% no no   Desaturated <= 3% points no no   Got Tachycardic >/= 90/min yes yes   Symptoms at end of test Mild dyspnea Mild dyspnea   Miscellaneous comments x x     CT Chest data  No results found. No results for input(s): "AST", "ALT", "ALKPHOS", "BILITOT", "PROT", "ALBUMIN", "INR" in the last 168 hours.  No results found for: "NITRICOXIDE"   PFT     Latest Ref Rng & Units 11/28/2021    9:48 AM 05/30/2021    1:11 PM  PFT Results  FVC-Pre L 4.27  P 4.32   FVC-Predicted Pre % 107  P 107   Pre FEV1/FVC % % 81  P 83   FEV1-Pre L 3.48  P 3.57   FEV1-Predicted Pre % 121  P 123   DLCO uncorrected ml/min/mmHg 18.43  P 20.11   DLCO UNC% % 76  P 83   DLCO corrected ml/min/mmHg 18.43  P 20.11   DLCO COR %Predicted % 76  P 83   DLVA Predicted % 75  P 84     P Preliminary result       has a past medical history of Diabetes mellitus without complication (HCC) and High cholesterol.   reports that he quit smoking about 23 years ago. His smoking use included cigarettes. He has never used smokeless tobacco.  Past Surgical History:  Procedure Laterality Date   BACK SURGERY     LEFT HEART CATH AND CORONARY ANGIOGRAPHY N/A 11/22/2019   Procedure: LEFT HEART CATH AND CORONARY ANGIOGRAPHY;  Surgeon: , MD;  [facility] INVASIVE CV LAB;  Service: Cardiovascular;  Laterality: N/A;   NECK SURGERY     PACEMAKER IMPLANT N/A 12/26/2019   Procedure: PACEMAKER IMPLANT;  Surgeon: , MD;  Location: [facility] CV LAB;  Service: Cardiovascular;  Laterality: N/A;    Allergies  Allergen Reactions   Doxycycline Shortness Of Breath and Rash   Lisinopril Cough   Amoxicillin Rash   Lasix [Furosemide] Rash   Pirfenidone Rash   Prednisone     High blood sugar   Sulfa Antibiotics Rash    Headache/ Chest tightness/ stiff neck    Immunization History  Administered Date(s) Administered   Fluad Quad(high  Dose 65+) 11/18/2021   Influenza Split 11/22/2012   Influenza, High Dose Seasonal PF 01/09/2020, 11/15/2020   Moderna Sars-Covid-2 Vaccination 04/11/2019, 05/09/2019, 01/17/2020, 06/26/2020   Pneumococcal Polysaccharide-23 09/24/2020    Family History  Problem Relation Age of Onset   CAD Brother    CAD Brother      Current Outpatient Medications:    albuterol (VENTOLIN HFA) 108 (90 Base) MCG/ACT inhaler, Inhale 2 puffs into the lungs every 6 (six) hours as needed for wheezing or shortness of breath., Disp: 8 g, Rfl: 5   aspirin 81 MG EC tablet, Take 81 mg by mouth daily. , Disp: , Rfl:    Cholecalciferol (VITAMIN D3) 25 MCG (1000 UT) CAPS, Take 1,000 Units by mouth daily., Disp: , Rfl:    cyanocobalamin 1000 MCG tablet, Take 1,000 mcg by mouth daily., Disp: , Rfl:    empagliflozin (JARDIANCE) 10 MG TABS tablet, Take 5 mg by mouth in the morning and at bedtime., Disp: , Rfl:    ferrous sulfate 325 (65 FE) MG tablet, Take 325 mg by mouth daily with breakfast., Disp: , Rfl:    fluticasone furoate-vilanterol (BREO ELLIPTA) 100-25 MCG/ACT AEPB, Inhale 1 puff into the lungs daily., Disp: 60 each, Rfl: 5   glipiZIDE (GLUCOTROL) 10 MG tablet, Take 5 mg by mouth 2 (two) times daily before a meal., Disp: , Rfl:    losartan (COZAAR) 25 MG tablet, TAKE 1 TABLET (25 MG TOTAL) BY MOUTH DAILY., Disp: 90 tablet, Rfl: 3   magnesium oxide (MAG-OX) 400 MG tablet, Take 400 mg by mouth 2 (two) times daily., Disp: , Rfl:    meclizine (ANTIVERT) 25 MG tablet, Take 25 mg by mouth 3 (three) times daily as needed for dizziness., Disp: , Rfl:    metFORMIN (GLUCOPHAGE) 1000 MG tablet, Take 1 tablet (1,000 mg total) by mouth in the morning and at bedtime., Disp: , Rfl:    Multiple Vitamin (MULTI-VITAMIN) tablet, Take 1 tablet by mouth daily., Disp: , Rfl:    Nintedanib (OFEV) 100 MG CAPS, Take 1 capsule (100 mg total) by mouth 2 (two) times daily. Clinicals have been faxed, Disp: 60 capsule, Rfl: 5   omeprazole  (PRILOSEC) 20 MG capsule, Take 20 mg by mouth See admin instructions. Every other night, Disp: , Rfl:    rosuvastatin (CRESTOR) 40 MG tablet, Take 40 mg by mouth at bedtime., Disp: , Rfl:       Objective:   Vitals:   11/28/21 1112  BP: (!) 114/58  Pulse: 70  Temp: 97.7 F (36.5 C)  TempSrc: Oral  SpO2: 97%  Weight: 181 lb (82.1 kg)  Height: 5\' 9"  (1.753 m)    Estimated body mass index is 26.73 kg/m as calculated from the following:   Height as of this encounter: 5\' 9"  (1.753 m).   Weight as of this encounter: 181 lb (82.1 kg).  @WEIGHTCHANGE @  [employer]   11/28/21 1112  Weight: 181 lb (82.1 kg)     Physical Exam   General: No distress. Looks well  Neuro: Alert and Oriented x 3. GCS 15. Speech normal Psych: Pleasant Resp:  Barrel Chest - no.  Wheeze - no, Crackles - mild, No overt respiratory distress CVS: Normal heart sounds. Murmurs - no Ext: Stigmata of Connective Tissue Disease - no HEENT: Normal upper airway. PEERL +. No post nasal drip        Assessment:       ICD-10-CM   1. IPF (idiopathic pulmonary fibrosis) (HCC)  I29.798 Pulmonary function test    2. Encounter for medication monitoring  Z51.81     3. Chronic cough  R05.3 Nitric oxide    Hepatic function panel    IgE    CBC w/Diff    Resp Allergy Profile Regn2DC DE [facility]    Resp Allergy Profile Regn2DC DE [facility]    CBC w/Diff    IgE    Hepatic function panel         Plan:     Patient Instructions    IPF (idiopathic pulmonary fibrosis) (HCC) Encounter for medication monitoring  -IPF is clinically stable compared to last visit  though the slight reduction in DLCO is concerning - Tolerting  month 2 of low dose ofev well (Start sept 2023)   Plan  -check LFT 11/28/2021 - if not already done today -Continue nintedanib 100 mg twice daily with food   -Our pharmacy to try to get it for [facility] through [facility]  -This is a lower dose  -If you are  stable on this through  spring 2024 then we can escalate the dose  -Discussed side effect profile of diarrhea, rare diverticulitis and even rare cardiac issues but overall risk versus benefit -we have agreed to continue -Do spirometry and DLCO in 12 weeks - RSV vaccine in fall  Chronic cough  ? Partly due to season allergies +.- cough variant asthma; prior blood eosinophil high (prior breo worked early 2023)  Plan - do IgE, cbc with diff and RAST allergy profile  - Okay for albuterol as needed - restart breo but dose 1 puff daily  - albuterol as needed - Start over-the-counter Mucinex as needed -If problems persist can consider cough recent study   Followup 12 weeks 30-minute visit with Dr. Marchelle Gearing but after PFT  -Symptom score and walking desaturation test at follow-up  High complex medical condition requiring high risk prescription with intensive therapeutic monitoring requirement   SIGNATURE    Dr. Kalman Shan, M.D., F.C.C.P,  Pulmonary and Critical Care Medicine Staff Physician, [facility] Director - Interstitial Lung Disease  Program  Pulmonary Fibrosis [facility] at [facility] [place], [place], [address]  Pager: [phone], If no answer or between  15:00h - 7:00h: call [phone] Telephone: [phone]  12:30 PM 11/28/2021

## 2021-11-28 NOTE — Progress Notes (Signed)
Spirometry and DLCO Performed Today.  

## 2021-11-29 LAB — RESPIRATORY ALLERGY PROFILE REGION II ~~LOC~~
Allergen, A. alternata, m6: <0.1 kU/L
Allergen, Cedar tree, t12: <0.1 kU/L
Allergen, Comm Silver Birch, t9: <0.1 kU/L
Allergen, Cottonwood, t14: 0.18 kU/L — ABNORMAL HIGH
Allergen, D pternoyssinus,d7: <0.1 kU/L
Allergen, Mouse Urine Protein, e78: <0.1 kU/L
Allergen, Mulberry, t76: <0.1 kU/L
Allergen, Oak,t7: 0.11 kU/L — ABNORMAL HIGH
Allergen, P. notatum, m1: <0.1 kU/L
Aspergillus fumigatus, m3: <0.1 kU/L
Bermuda Grass: <0.1 kU/L
Box Elder IgE: 0.13 kU/L — ABNORMAL HIGH
CLADOSPORIUM HERBARUM (M2) IGE: <0.1 kU/L
COMMON RAGWEED (SHORT) (W1) IGE: <0.1 kU/L
Cat Dander: <0.1 kU/L
Class: 0
Class: 0
Class: 0
Class: 0
Class: 0
Class: 0
Class: 0
Class: 0
Class: 0
Class: 0
Class: 0
Class: 0
Class: 0
Class: 0
Class: 0
Class: 0
Class: 0
Class: 0
Class: 0
Cockroach: <0.1 kU/L
D. farinae: <0.1 kU/L
Dog Dander: <0.1 kU/L
Elm IgE: 0.19 kU/L — ABNORMAL HIGH
IgE (Immunoglobulin E), Serum: 104 kU/L (ref <=114)
Johnson Grass: <0.1 kU/L
Pecan/Hickory Tree IgE: 0.13 kU/L — ABNORMAL HIGH
Rough Pigweed  IgE: <0.1 kU/L
Sheep Sorrel IgE: <0.1 kU/L
Timothy Grass: <0.1 kU/L

## 2021-11-29 LAB — INTERPRETATION:

## 2021-12-16 ENCOUNTER — Other Ambulatory Visit (HOSPITAL_COMMUNITY): Payer: Self-pay

## 2021-12-16 ENCOUNTER — Telehealth: Payer: Self-pay | Admitting: Pharmacy Technician

## 2021-12-16 MED ORDER — FLUTICASONE-SALMETEROL 250-50 MCG/ACT IN AEPB: 1.0000 | INHALATION_SPRAY | Freq: Two times a day (BID) | RESPIRATORY_TRACT | 11 refills | Status: DC

## 2021-12-16 NOTE — Telephone Encounter (Signed)
MR, please advise on this. 

## 2021-12-16 NOTE — Telephone Encounter (Signed)
   1 inhalation of Wixela Inhub 250/50 twice daily, approximately 12 hours apart.

## 2021-12-16 NOTE — Telephone Encounter (Signed)
Pharmacy Patient Advocate Encounter  Received Prior Auth request for [place] from [place]. They are requesting patient to try their preferred alternative(s)-Wixela. Justification for use of non-formulary medication is required.    Appropriate reasons are contraindications to the formulary agent, adverse reactions to the formulary agent, therapeutic failure of all formulary alternatives, no formulary alternatives exist, or serious risk is associated with a changed to a formulary agent.   If none of these reasons apply, please send in new rx for [facility]

## 2021-12-16 NOTE — Telephone Encounter (Signed)
I spoke with the pt and notified of inhaler change He verbalized understanding and was okay with this  Wixella sent to preferred pharm  Nothing further needed

## 2022-01-31 LAB — CUP PACEART REMOTE DEVICE CHECK
Battery Remaining Longevity: 132 mo
Battery Remaining Percentage: 100 %
Brady Statistic RA Percent Paced: 5 %
Brady Statistic RV Percent Paced: 100 %
Date Time Interrogation Session: 20231229034100
Implantable Lead Connection Status: 753985
Implantable Lead Connection Status: 753985
Implantable Lead Implant Date: 20211122
Implantable Lead Implant Date: 20211122
Implantable Lead Location: 753859
Implantable Lead Location: 753860
Implantable Lead Model: 7841
Implantable Lead Model: 7842
Implantable Lead Serial Number: 1057413
Implantable Lead Serial Number: 1098402
Implantable Pulse Generator Implant Date: 20211122
Lead Channel Impedance Value: 579 Ohm
Lead Channel Impedance Value: 721 Ohm
Lead Channel Pacing Threshold Amplitude: 0.5 V
Lead Channel Pacing Threshold Amplitude: 1 V
Lead Channel Pacing Threshold Pulse Width: 0.4 ms
Lead Channel Pacing Threshold Pulse Width: 0.4 ms
Lead Channel Setting Pacing Amplitude: 2 V
Lead Channel Setting Pacing Amplitude: 2.5 V
Lead Channel Setting Pacing Pulse Width: 0.4 ms
Lead Channel Setting Sensing Sensitivity: 2.5 mV
Pulse Gen Serial Number: 962270
Zone Setting Status: 755011

## 2022-03-06 ENCOUNTER — Telehealth: Payer: Self-pay

## 2022-03-06 ENCOUNTER — Encounter: Payer: Self-pay | Admitting: Internal Medicine

## 2022-03-06 ENCOUNTER — Ambulatory Visit (INDEPENDENT_AMBULATORY_CARE_PROVIDER_SITE_OTHER): Payer: No Typology Code available for payment source | Admitting: Internal Medicine

## 2022-03-06 VITALS — BP 112/58 | HR 71 | Ht 69.0 in | Wt 179.0 lb

## 2022-03-06 DIAGNOSIS — J84112 Idiopathic pulmonary fibrosis: Secondary | ICD-10-CM

## 2022-03-06 DIAGNOSIS — D721 Eosinophilia, unspecified: Secondary | ICD-10-CM

## 2022-03-06 DIAGNOSIS — R0982 Postnasal drip: Secondary | ICD-10-CM

## 2022-03-06 DIAGNOSIS — R053 Chronic cough: Secondary | ICD-10-CM | POA: Diagnosis not present

## 2022-03-06 DIAGNOSIS — Z5181 Encounter for therapeutic drug level monitoring: Secondary | ICD-10-CM | POA: Diagnosis not present

## 2022-03-06 LAB — PULMONARY FUNCTION TEST
DL/VA % pred: 83 %
DL/VA: 3.29 ml/min/mmHg/L
DLCO cor % pred: 78 %
DLCO cor: 18.84 ml/min/mmHg
DLCO unc % pred: 78 %
DLCO unc: 18.84 ml/min/mmHg
FEF 25-75 Pre: 3.68 L/sec
FEF2575-%Pred-Pre: 181 %
FEV1-%Pred-Pre: 117 %
FEV1-Pre: 3.35 L
FEV1FVC-%Pred-Pre: 111 %
FEV6-%Pred-Pre: 110 %
FEV6-Pre: 4.12 L
FEV6FVC-%Pred-Pre: 106 %
FVC-%Pred-Pre: 104 %
FVC-Pre: 4.15 L
Pre FEV1/FVC ratio: 81 %
Pre FEV6/FVC Ratio: 99 %

## 2022-03-06 MED ORDER — FLUTICASONE-SALMETEROL 250-50 MCG/ACT IN AEPB: 1.0000 | INHALATION_SPRAY | Freq: Two times a day (BID) | RESPIRATORY_TRACT | 11 refills | Status: DC

## 2022-03-06 NOTE — Progress Notes (Signed)
Spirometry and DLCO completed today  ?

## 2022-03-06 NOTE — Progress Notes (Signed)
IOV august 2022 for SOB by [facility]  Subjective:   [name]: male DOB: 01-26-1946, MRN: [id number]  Chief Complaint  Patient presents with   Consult    Pt is being referred due to chronic cough.  Pt states that he does have complaints of chest tightness and SOB with activities.      PMH DMII, high cholesterol. He has recurrent issues with chest tightness. Dry cough, usually non-productive. Occasionally in the morning her has productive cough. He has gerd and takes a PPI. If he does any activity it makes the episodes worse. Hot shower and activity makes it worse. No seasonal changes that he notices. He does have sinus issues at times. Retired at a Agricultural consultant. Was in [place], [place] 1966 - 1971. No pets in the house. PFTS at [facility] ratio normal, FEV1 2L, normal dlco, normal tlc. He stays active and still working part time. He has used albuterol in the past prior to getting his pacemaker.   CT scan of the chest was completed in November 2021 which revealed lower lobe honeycombing and early evidence of interstitial lung disease. xxxxxx  OV 11/23/2020: here to day for follow up. Unfortunately he did not have his CT Chest complete. It was set up through the [place] and he was not prepared to pay for it. His cough is better. His chest tightness is better as well.  Has been using his inhalers.  OV 12/20/2020 -transfer of care to the ILD center to Dr. Chase Caller.  Referred by Dr. June Leap  Subjective:  [name], male , DOB: May 17, 1945 , age 75 y.o. , MRN: [id number] , ADDRESS: [place] [address] [facility]CP [facility] Patient Care Team: [facility] as PCP - General Debara Pickett Nadean Corwin, MD as PCP - Cardiology (Cardiology)  This Provider for this visit: Treatment Team:  Attending Provider: Brand Males, MD    12/20/2020 -   Chief Complaint  Patient presents with   Follow-up    Chest  tightness     [name] 77 y.o. -seen in August 2022 in October 2022 by Dr. Valeta Harms.  He is here with his wife.  He is a English as a second language teacher. Dr Gwenette Greet at the [facility] in the area is retired.  Therefore he is seeing [place].  He reports insidious onset of shortness of breath at least a year.  This started around the time he had his pacemaker.  The pacemaker did get rid of his syncope but not the shortness of breath.  Is present on exertion relieved by rest.  Recently Dr. Valeta Harms started [facility] and this is helped his chest tightness on exertion but he still continues have significant shortness of breath with exertion and cough.  Documented below.  He is aware that he has pulmonary fibrosis.  His symptoms are somewhat progressive.  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  Shared with the family his results.  Review of the records indicate that he has not had serology so far.  An ILD questionnaire packet was mailed out to him but he has not received it yet so we do not have the data.  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  His other medications include Breo and omeprazole.  He has a history of diverticulitis.  No coronary artery disease but he has a pacemaker.  He is not on anticoagulation he  is on aspirin only.  His walking desaturation test shows adequate oxygenation with minimal pulse ox drop.   [facility] Comprehensive ILD Questionnaire -dictated on 03/19/2021  Symptoms:  x    Past Medical History :  -Denies any asthma or COPD or heart failure.  Denies collagen vascular disease..  Denies sleep apnea -Does have acid reflux -Does have diabetes.  Intolerant to prednisone.  Makes him very hyperglycemic -Intolerant to gabapentin -Never had COVID   ROS:  -Does have exertional fatigue -10 pound weight loss prior to the November 2022 consult -Dry eyes present -Does have occasional acid reflux which is fairly well controlled with  omeprazole  FAMILY HISTORY of LUNG DISEASE:  -Mom had asthma  PERSONAL EXPOSURE HISTORY:  -Smoke cigarettes stop smoking 23 years ago.  Never did vaping in the past smoked some amount of marijuana but not much.  No cocaine use no intravenous drug use  HOME  EXPOSURE and HOBBY DETAILS :  -[place]ives in the urban house [place] 1997.  The house was 78 years old.  No dampness no mold or mildew.  Does not use CPAP.  Extensive organic antigen exposure history is negative  OCCUPATIONAL HISTORY (122 questions) : -He is a veteran but no agent orange exposure.  Has done gardening.  Has done woodwork.  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  Has done a garage repair.  Has done furniture work.  Has done some car Cabin crew work.  Has done machine operating.  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  Intolerant to prednisone. -For postnasal drip has tried nasal spray saline and steroid nasal spray and it does not work  INVESTIGATIONS: -Below   CT Chest data - HRCT 11/27/20  Study Result  Narrative & Impression  CLINICAL DATA:  77 year old male with history of interstitial lung disease. Follow-up study.   EXAM: CT CHEST WITHOUT CONTRAST   TECHNIQUE: Multidetector CT imaging of the chest was performed following the standard protocol without intravenous contrast. High resolution imaging of the lungs, as well as inspiratory and expiratory imaging, was performed.   COMPARISON:  Chest CT 12/19/2019.   FINDINGS: Cardiovascular: Heart size is normal. There is no significant pericardial fluid, thickening or pericardial calcification. There is aortic atherosclerosis, as well as atherosclerosis of the great vessels of the mediastinum and the coronary arteries, including calcified atherosclerotic plaque in the left main, left anterior descending, left circumflex and right coronary  arteries. Severe calcifications of the aortic valve and mitral annulus. Left-sided pacemaker device in place with lead tips terminating in the right atrial appendage and right ventricular apex.   Mediastinum/Nodes: No pathologically enlarged mediastinal or hilar lymph nodes. Please note that accurate exclusion of hilar adenopathy is limited on noncontrast CT scans. Esophagus is unremarkable in appearance. No axillary lymphadenopathy.   Lungs/Pleura: High-resolution images demonstrate widespread but patchy areas of peripheral predominant ground-glass attenuation, septal thickening, subpleural reticulation, cylindrical bronchiectasis and peripheral bronchiolectasis. Findings appear progressive compared to the prior study. Findings have a definitive craniocaudal gradient. No frank honeycombing is confidently identified at this time. Inspiratory and expiratory imaging demonstrates some very mild air trapping in the lung bases indicative of mild small airways disease. No acute consolidative airspace disease. No pleural effusions. No suspicious appearing pulmonary nodules or masses are noted.   Upper Abdomen: Aortic atherosclerosis.   Musculoskeletal: Orthopedic fixation hardware in the lower cervical spine incidentally noted.  There are no aggressive appearing lytic or blastic lesions noted in the visualized portions of the skeleton.   IMPRESSION: 1. Progressively worsening interstitial lung disease, 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 2. Aortic atherosclerosis, in addition to left main and 3 vessel coronary artery disease. Assessment for potential risk factor modification, dietary therapy or pharmacologic therapy may be warranted, if clinically indicated. 3. There are calcifications of the aortic valve and mitral annulus. Echocardiographic correlation for evaluation of potential valvular dysfunction may be  warranted if clinically indicated.   Aortic Atherosclerosis (ICD10-I70.0).     Electronically Signed   By: Trudie Reedaniel  Entrikin M.D.   On: 11/28/2020 12:46    No results found.  Cardoiac cath  oct 2021 Mid RCA lesion is 25% stenosed. Prox Cx to Mid Cx lesion is 25% stenosed. Prox LAD to Mid LAD lesion is 25% stenosed. The left ventricular systolic function is normal. LV end diastolic pressure is normal. The left ventricular ejection fraction is 55-65% by visual estimate. There is no aortic valve stenosis.   Nonobstructive CAD.  Continue preventive therapy  PFT  No flowsheet data found.   Latest Reference Range & Units 11/19/19 12:17  Creatinine 0.61 - 1.24 mg/dL 1.611.00    Latest Reference Range & Units 11/19/19 12:17  Hemoglobin 13.0 - 17.0 g/dL 09.614.1       OV 0/4/54091/07/2021  Subjective:  Patient ID: Scott CossBilly L Mewborn, male , DOB: 03/26/1945 , age 77 y.o. , MRN: [id number] , ADDRESS: [address] [place] [address] [facility] - General Rennis GoldenHilty, Scott AbuKenneth C, MD as PCP - Cardiology (Cardiology)  This Provider for this visit: Treatment Team:  Attending Provider: Kalman Shanamaswamy, Davion Meara, MD  Type of visit:Video Circumstance: COVID-19 national emergency Identification of patient Scott CossBilly L Christo with 01/26/1946 and MRN [id number] - 2 person identifier Risks: Risks, benefits, limitations of telephone visit explained. Patient understood and verbalized agreement to proceed Anyone else on call: his wife on side Patient location: [address] [place] [id number] This provider location: [address], [address]; [place]; [place] [id number]. [facility]. [phone]    02/08/2021 - clinical IPF followup - to assess esbriet uptake    HPI Scott CossBilly L Coltrane 77 y.o. - only on 2nd week of esbreit. On 2 pills tid. Sunday 02/10/21 ends 2pills tid. On Monday 02/11/21 will go to 3 pills tid. No side effects. Spacing 5-6h apart .  Does not wear sunscreen bu wears hat and long sleeve clothes. Does have sunscren. Reviewd serology and cbc/chmeistry - these are normal  However, reports chest congestion. Hacks and coughs. Sputum +. Says it is baseline few months ago. Rates  it as 4-5 of 5.Does not know sputum color - but thinks it is clear. Not wheezing. Clears throat. fEels he says he cannot take prednisone - even 5 days - drives him hyperglucemic 400s  AUTOIMMUNE 12/20/20   Latest Reference Range & Units 11/19/19 12:17 12/14/19 09:41 04/27/20 15:59 12/20/20 15:13  Sed Rate 0 - 20 mm/hr    7  Glucose 70 - 99 mg/dL 253156 (H) 664207 (H) 403157 (H) 166 (H)  Anti Nuclear Antibody (ANA) NEGATIVE     NEGATIVE  Angiotensin-Converting Enzyme 9 - 67 U/L    20  ds DNA Ab IU/mL    <1  RA Latex Turbid. <14 IU/mL    <14  SSA (Ro) (ENA)  Antibody, IgG <1.0 NEG AI    <1.0 NEG  SSB (La) (ENA) Antibody, IgG <1.0 NEG AI    <1.0 NEG  Scleroderma (Scl-70) (ENA) Antibody, IgG <1.0 NEG AI    <1.0 NEG  QUANTIFERON-TB GOLD PLUS     Rpt  (H): Data is abnormally high Rpt: View report in Results Review for more information   PFT  No flowsheet data found  OV 03/19/2021  Subjective:  [name], male , DOB: 30-Mar-1945 , age 27 y.o. , MRN: [id number] , ADDRESS: [address] [place] [address] [facility]CP [facility] Care Team: [facility] as PCP - General Debara Pickett Nadean Corwin, MD as PCP - Cardiology (Cardiology)  This Provider for this visit: Treatment Team:  Attending Provider: Brand Males, MD    03/19/2021 -   Chief Complaint  Patient presents with   Follow-up    Pt states he has been doing okay since last visit. States he does still become SOB with exertion and still has complaints of coughing that is worse in the mornings.   Diagnois is based on age > 66, ex smoker, progressive disease, probable UIP pattern on CT., negative serolgy and ILD qquewtionaire and acid reflux  -Start pirfenidone  around Christmas 2022 -Significant out of proportion cough  [name] 77 y.o. -returns for follow-up.  I reviewed his ILD questionnaire and his exposures consistent with IPF.  Currently he is on pirfenidone.  He is now on 3 pills 3 times daily for at least a month.  He says ever since going to 3 pills 3 times daily he has occasional mild acid reflux which his wife also test.  He feels a heartburn.  It is mild and tolerable.  He does take omeprazole but he is taking it with food as opposed to empty stomach.  There is no nausea.  No vomiting no diarrhea.  He feels his weight is stable.  Overall he is pleased with his Esbriet tolerance.  His chronic symptoms of shortness of breath and cough are stable although the cough is the most significant of symptoms.  He rates it as a 3 out of 5.  It is worse early in the morning than the rest of the day.  There is clear sputum.  Last visit I prescribed Robitussin which is helping him.  I also suggested he take Tessalon but he he is unable to afford a $66 co-pay.  We discussed gabapentin but this makes him sleepy from a previous neuropathic experience.  We discussed prednisone but this makes him hyperglycemic he does not want to do it.  We discussed opioids but he does not want to do that either.  He is on chronic Afrin nasal spray.  We discussed taking saline nasal spray or nasal steroids for his sinus drainage but he feels those things do not help.  Nevertheless he is willing to try it.  He is going to have a liver function test today  We discussed participation in clinical trials and he is interested in the future. y    No results found.    PFT  No flowsheet data found.    OV 05/30/2021  Subjective:  [name], male , DOB: 1945/08/07 , age 46 y.o. , MRN: [id number] , ADDRESS: [place] [address] [facility]CP [facility] Patient Care Team: [facility] as PCP - General Debara Pickett Nadean Corwin, MD as PCP -  Cardiology (  Cardiology)  This Provider for this visit: Treatment Team:  Attending Provider: Kalman Shan, MD    05/30/2021 -   Chief Complaint  Patient presents with   Follow-up    PFT performed today.  Pt states that he began breaking out on his face about 2 weeks ago. States his breathing has been about the same.     HPI SHINE MIKES 77 y.o. -returns for follow-up.  He is here with his wife.  He is a little bit upset because of running 30 minutes behind.  Apologize for this delay.  He tells me respiratory wise he stable.  He had pulmonary function test that looks for fairly robust.  However in the last 3 weeks he has had rash in his face.  This whole face is red.  He also some increased erythema in his bilateral forearms.  These are sun exposed areas.  He does admit to applying sunscreen but he is on pirfenidone at the same time.  2 weeks ago he ended up in the emergency department on 05/18/2021 and was given valacyclovir for blistering in the lips but this has not helped.  He is applying Chapstick.  He is frustrated by this.  Also simultaneously since starting pirfenidone he has had altered taste with food.   We did discuss potential steroids for his rash but he wants to follow with his primary dermatologist through the [place].  He is also worried about steroids because of its negative effect with his blood sugars.     OV 08/23/2021  Subjective:  [name], male , DOB: [birth year] , age 11 y.o. , MRN: [id number] , AD[address]ESS: [address] [place] [address] [facility]CP [facility] Patient Care Team: [facility] PCP - General Rennis Golden Scott Abu, MD as PCP - Cardiology (Cardiology)  This Provider for this visit: Treatment Team:  Attending Provider: Kalman Shan, MD    08/23/2021 -   Chief Complaint  Patient presents with   Follow-up    Follow-up   [name] 77 y.o. -returns for follow-up.  He is no longer on pirfenidone after  stopping pirfenidone in April 2023 his dysgeusia resolved.  Rash also is resolved itching also resolved.  He did see nurse practitioner in May 2023 and by this time his rash was resolving.  Currently is not on any antifibrotic.  Overall he feels stable.  He is aware that nintedanib the other antifibrotic was considered second line for him because of the history of diverticulitis.  He tells me that has not had diverticulitis flareup in a long time.  He does not have any heart disease issues except he has pacemaker.  He has never had a heart attack.  He is aware now that nintedanib is very rarely associated with slight increase in MI risk.  Recently the mayor of [place], [place] who is the longest serving mayor in any city [place] passed away from pulmonary fibrosis.  This was a public news.  He says he is feeling sad because of that.  Wife states "it hit him hard".  Therefore he is more inclined to try nintedanib.  He wants to get [facility] the [facility].  In terms of symptoms he still has his cough.  He clears his throat a lot.  Albuterol helps.  We talked about over-the-counter Mucinex he is open to this idea.  Also of note: The last few weeks his right knee osteoarthritis is flared  up.  Because of this he did not do walk test.    OV 11/28/2021  Subjective:  Patient ID: Scott CossBilly L Asquith, male , DOB: 02/16/1945 , age 77 y.o. , MRN: [id number] , ADDRESS: [address] [place] [address] [facility] - General Scott Price, Scott AbuKenneth C, MD as PCP - Cardiology (Cardiology)  This Provider for this visit: Treatment Team:  Attending Provider: Kalman Shanamaswamy, Galdino Hinchman, MD     11/28/2021 -   Chief Complaint  Patient presents with   Office Visit    Had PFT today PT states no changes since last visit     HPI Scott CossBilly L Jentz 77 y.o. -for follow-up.  Presents with his wife.  He is currently on a second bottle of Ofev 100 mg twice  daily.  He is tolerating it well without any GI symptoms.  His symptoms are stable.  His pulmonary function test is stable except for very mild reduction in DLCO which could be technical variation.  He has no change in his baseline symptoms other than the fact he has got cough that is still present it is mild to moderate in severity and annoying.  Review of the records indicate that his mom had asthma and earlier in the year he did feel better with the chest tightness and some cough with Breo.  Review of the labs indicate that he is all very had elevated blood eosinophils 5 cells per cubic millimeter in 2023.  He has never had allergy testing but he does report seasonal variation with a cough.      OV 03/06/2022  Subjective:  Patient ID: Scott CossBilly L Barbato, male , DOB: 05/05/1945 , age 77 y.o. , MRN: [id number] , ADDRESS: [address] [place] [address] [facility] as [facility] - General Scott Price, Scott AbuKenneth C, MD as PCP - Cardiology (Cardiology)  This Provider for this visit: Treatment Team:  Attending Provider: Kalman Shanamaswamy, Diaz Crago, MD    03/06/2022 -   Chief Complaint  Patient presents with   Follow-up    Pft review    Diagnois is based on age > 3160, ex smoker, progressive disease, probable UIP pattern on CT., negative serolgy and ILD qquewtionaire and acid reflux  -Start pirfenidone around Christmas 2022 - stopped 05/30/21 due to rash/taste  -Started low-dose nintedanib 100 mg twice daily September 2023  -Clinical research as a care option discussion done February 2023 in February 2024: Not interested.  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 -returns for follow-up.  Presents with his wife.  Overall he feels he is doing stable.  [facility].  He is on Advair for the last few to several months.  He is overall feeling stable but he feels the cough is the most bothersome problem although the cough itself is stable it is quite bothersome.  It is mostly in the day even at rest.  The wife describes it as bad and she is an independent historian today.  It does not bother his sleep except occasionally.  There are no new problems.  He is  saying there is a lot of sinus drainage.  Last visit we did a RAST allergy panel and blood eosinophils both elevated and abnormal.  No emergency room visits no urgent care visits no hospitalizations.  The results of the RAST allergy panel were reviewed.  In terms of his IPF symptom score is below.  He had pulmonary function test.  Compared to 1 year ago there is a slight decline in FVC and DLCO although quite acceptable.  He is tolerating his low-dose nintedanib protocol 100 mg twice daily fine      SYMPTOM SCALE - ILD 12/20/2020 05/30/2021 Esbriet - rash 08/23/2021 Not on any antifibroic 11/28/2021 Ofev 100mg  bid 03/06/2022 Ofev 100mg  bid  Current weight       O2 use ra ra ra ra ra  Shortness of Breath 0 -> 5 scale with 5 being worst (score 6 If unable to do)      At rest 2 1 2 1 2   Simple tasks - showers, clothes change, eating, shaving 3 3 3 2 2   Household (dishes, doing bed, laundry) 4 3 3 1 2   Shopping 3 3 2 2 2   Walking level at own pace 3 4 3 3 3   Walking up Stairs Does not do 4 3 3 3   Total (30-36) Dyspnea Score 15 18 16 13 14   How bad is your cough? 4 4 3  - worse at night 3 4  How bad is your fatigue 4 5 3 4 4   How bad is nausea 0 0 0 0 0  How bad is vomiting?  0 0 0 0 0  How bad is diarrhea? 1 0 0 0 0  How bad is anxiety? 5 4 0 0 0  How bad is depression 0 1 0 0 0  Any chronic pain - if so where and how bad 0  Rt knoee 0 0  0    Simple office walk  185 feet x  3 laps goal with forehead probe 12/20/2020  03/19/2021  11/28/2021   O2 used ra ra   Number laps completed 3 3   Comments about pace 3    Resting Pulse Ox/HR 99% and 78/min 100 and 79   Final Pulse Ox/HR 97% and 96/min 100% and 90   Desaturated </= 88% no no   Desaturated <= 3% points no no   Got Tachycardic >/= 90/min yes yes   Symptoms at end of test Mild dyspnea Mild dyspnea   Miscellaneous comments x x     PFT     Latest Ref Rng & Units 03/06/2022    8:40 AM 11/28/2021    9:48 AM 05/30/2021    1:11 PM  PFT Results  FVC-Pre L 4.15  P 4.27  4.32   FVC-Predicted Pre % 104  P 107  107   Pre FEV1/FVC % % 81  P 81  83   FEV1-Pre L 3.35  P 3.48  3.57   FEV1-Predicted Pre % 117  P 121  123   DLCO uncorrected ml/min/mmHg 18.84  P 18.43  20.11   DLCO UNC% % 78  P 76  83   DLCO corrected ml/min/mmHg 18.84  P 18.43  20.11   DLCO COR %Predicted % 78  P 76  83   DLVA Predicted % 83  P 75  84     P Preliminary result     Latest Reference Range & Units 12/20/20 15:13 05/30/21 16:29 08/23/21 15:43 11/28/21 11:59  Eosinophils Absolute  0.0 - 0.7 K/uL 0.3 0.4 854 (H) 0.5  (H): Data is abnormally high  Latest Reference Range & Units 11/28/21 11:59  Sheep Sorrel IgE kU/L <0.10  Pecan/Hickory Tree IgE kU/L 0.13 (H)  IgE (Immunoglobulin E), Serum <OR=114 kU/L 104  Allergen, D pternoyssinus,d7 kU/L <0.10  Cat Dander kU/L <0.10  Dog Dander kU/L <0.10  [place] Grass kU/L <0.10  Johnson Grass kU/L <0.10  Timothy Grass kU/L <0.10  Cockroach kU/L <0.10  Aspergillus fumigatus, m3 kU/L <0.10  Allergen, Comm Silver Charletta Cousin, t9 kU/L <0.10  Allergen, Cottonwood, t14 kU/L 0.18 (H)  Elm IgE kU/L 0.19 (H)  Allergen, Mulberry, t76 kU/L <0.10  Allergen, Oak,t7 kU/L 0.11 (H)  COMMON RAGWEED (SHORT) (W1) IGE kU/L <0.10  Allergen, Mouse Urine Protein, e78 kU/L <0.10  D. farinae kU/L <0.10  Allergen, Cedar tree, t12 kU/L <0.10  Box Elder IgE kU/L 0.13 (H)  Rough Pigweed  IgE kU/L <0.10   (H): Data is abnormally high  has a past medical history of Diabetes mellitus without complication (HCC) and High cholesterol.   reports that he quit smoking about 24 years ago. His smoking use included cigarettes. He has never used smokeless tobacco.  Past Surgical History:  Procedure Laterality Date   BACK SURGERY     LEFT HEART CATH AND CORONARY ANGIOGRAPHY N/A 11/22/2019   Procedure: LEFT HEART CATH AND CORONARY ANGIOGRAPHY;  Surgeon: Corky Crafts, MD;  Location: [facility] INVASIVE CV LAB;  Service: Cardiovascular;  Laterality: N/A;   NECK SURGERY     PACEMAKER IMPLANT N/A 12/26/2019   Procedure: PACEMAKER IMPLANT;  Surgeon: Marinus Maw, MD;  Location: [facility] INVASIVE CV LAB;  Service: Cardiovascular;  Laterality: N/A;    Allergies  Allergen Reactions   Doxycycline Shortness Of Breath and Rash   Lisinopril Cough   Amoxicillin Rash   Lasix [Furosemide] Rash   Pirfenidone Rash   Prednisone     High blood sugar   Sulfa Antibiotics Rash    Headache/ Chest tightness/ stiff neck    Immunization History  Administered Date(s) Administered   Fluad Quad(high Dose 65+) 11/18/2021   Influenza Split 11/22/2012   Influenza, High Dose Seasonal PF 01/09/2020, 11/15/2020   Moderna Covid-19 Vaccine Bivalent Booster 67yrs & up 12/25/2020   Moderna Sars-Covid-2 Vaccination 04/11/2019, 05/09/2019, 01/17/2020, 06/26/2020   Pneumococcal Polysaccharide-23 09/24/2020    Family History  Problem Relation Age of Onset   CAD Brother    CAD Brother      Current Outpatient Medications:    albuterol (VENTOLIN HFA) 108 (90 Base) MCG/ACT inhaler, Inhale 2 puffs into the lungs every 6 (six) hours as needed for wheezing or shortness of breath., Disp: 8 g, Rfl: 5   aspirin 81 MG EC tablet, Take 81 mg by mouth daily. , Disp: , Rfl:    Cholecalciferol (VITAMIN D3) 25 MCG (1000 UT) CAPS, Take 1,000 Units by mouth daily., Disp: , Rfl:    cyanocobalamin 1000 MCG tablet, Take 1,000 mcg by mouth daily.,  Disp: , Rfl:    empagliflozin (JARDIANCE) 10 MG TABS tablet, Take 5 mg by mouth in the morning and at bedtime., Disp: , Rfl:    ferrous sulfate 325 (65 FE) MG tablet, Take 325 mg by mouth daily with breakfast., Disp: , Rfl:    fluticasone-salmeterol (WIXELA INHUB) 250-50 MCG/ACT AEPB, Inhale 1 puff into the lungs in the morning and at bedtime., Disp: 60 each, Rfl: 11   glipiZIDE (GLUCOTROL) 10 MG tablet, Take 5 mg by mouth 2 (two)  times daily before a meal., Disp: , Rfl:    magnesium oxide (MAG-OX) 400 MG tablet, Take 400 mg by mouth 2 (two) times daily., Disp: , Rfl:    meclizine (ANTIVERT) 25 MG tablet, Take 25 mg by mouth 3 (three) times daily as needed for dizziness., Disp: , Rfl:    metFORMIN (GLUCOPHAGE) 1000 MG tablet, Take 1 tablet (1,000 mg total) by mouth in the morning and at bedtime., Disp: , Rfl:    Multiple Vitamin (MULTI-VITAMIN) tablet, Take 1 tablet by mouth daily., Disp: , Rfl:    Nintedanib (OFEV) 100 MG CAPS, Take 1 capsule (100 mg total) by mouth 2 (two) times daily. Clinicals have been faxed, Disp: 60 capsule, Rfl: 5   omeprazole (PRILOSEC) 20 MG capsule, Take 20 mg by mouth See admin instructions. Every other night, Disp: , Rfl:    rosuvastatin (CRESTOR) 40 MG tablet, Take 40 mg by mouth at bedtime., Disp: , Rfl:    fluticasone furoate-vilanterol (BREO ELLIPTA) 100-25 MCG/ACT AEPB, Inhale 1 puff into the lungs daily. (Patient not taking: Reported on 03/06/2022), Disp: 60 each, Rfl: 5   losartan (COZAAR) 25 MG tablet, TAKE 1 TABLET (25 MG TOTAL) BY MOUTH DAILY., Disp: 90 tablet, Rfl: 3      Objective:   Vitals:   03/06/22 0954  BP: (!) 112/58  Pulse: 71  SpO2: 95%  Weight: 179 lb (81.2 kg)  Height: 5\' 9"  (1.753 m)    Estimated body mass index is 26.43 kg/m as calculated from the following:   Height as of this encounter: 5\' 9"  (1.753 m).   Weight as of this encounter: 179 lb (81.2 kg).  @WEIGHTCHANGE @    03/06/22 0954  Weight: 179 lb (81.2 kg)      Physical Exam   General: No distress. Looks well COUGHS   A LOT and clears nose Neuro: Alert and Oriented x 3. GCS 15. Speech normal Psych: Pleasant Resp:  Barrel Chest - no.  Wheeze - no, Crackles - YES, No overt respiratory distress CVS: Normal heart sounds. Murmurs - no Ext: Stigmata of Connective Tissue Disease - no HEENT: Normal upper airway. PEERL +. No post nasal drip        Assessment:       ICD-10-CM   1. IPF (idiopathic pulmonary fibrosis) (HCC)  J84.112     2. Encounter for medication monitoring  Z51.81     3. Chronic cough  R05.3     4. Eosinophilia, unspecified type  D72.10     5. Post-nasal drip  R09.82          Plan:     Patient Instructions   IPF (idiopathic pulmonary fibrosis) (HCC) Encounter for medication monitoring  -IPF might be slowly progresive in past 1 year - Tolerting  low dose ofev well (Start sept 2023)   Plan  -check LFT 03/06/2022 - if not already done today -Continue nintedanib 100 mg twice daily with food   -Our pharmacy to try to get it for you through the [facility]  -This is a lower dose  -If you are  stable on this through summer/fall 2024 then we can escalate the dose  -Discussed side effect profile of diarrhea, rare diverticulitis and even rare cardiac issues but overall risk versus benefit -we have agreed to continue -Do spirometry and DLCO in 16 weeks - hold off clinical trials due to lack of interest  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  Partly due to season allergies +.- cough variant asthma; blood eosinophil high and aRAST allrgy test positive for tree, cottonw wood in 2023  Plan  - sTART [place] for albuterol as needed - continue advair at 1 puff twice daily  - albuterol as needed - Start over-the-counter Mucinex as needed -If problems persist can consider cough recent study  GERD  Plan  - cotinue GERD control with  OTC prilosec   Followup 16 weeks 30-minute visit with Dr. Chase Caller but after PFT  -Symptom score and walking desaturation test at follow-up  3    Moderate Complexity MDM NEW OFFICE  The table below is from the 2021 E/M guidelines, first released in 2021, with minor revisions added in 2023. Must meet the requirements for 2 out of 3 dimensions to qualify.    Number and complexity of problems addressed Amount and/or complexity of data reviewed Risk of complications and/or morbidity  One or more chronic illness with mild exacerbation, progression, or side effects of treatment  Two or more stable chronic illnesses  One undiagnosed new problem with uncertain prognosis  One acute illness with systemic symptoms   Acute complicated injury Must meet the requirements for 1 of 3 of the categories)  Category 1: Tests and documents, historian  Any combination of 3 of the following:  Assessment requiring an independent historian  Review of prior external records  Review of results of each unique test  Ordering of each unique test    Category 2: Interpretation of tests  Independent interpretation of a test perfromed by another physician/NPP  Category 3: Discuss management/tests  Discussion of magagement or tests with an external physician/NPP Prescription drug management  Decision regarding minor surgery with identfied patient or procedure risk factors  Decision regarding elective major surgery without identified patient or procedure risk factors  Diagnosis or treatment significantly limited by social determinants of health              SIGNATURE    Dr. Brand Males, M.D., F.C.C.P,  Pulmonary and Critical Care Medicine Staff Physician, [place] Director - Interstitial Lung Disease  Program  Pulmonary [place] at [place], [place], [address]  Pager: [phone], If no answer or between  15:00h -  7:00h: call [phone] Telephone: [phone]  10:40 AM 03/06/2022

## 2022-03-06 NOTE — Patient Instructions (Addendum)
  IPF (idiopathic pulmonary fibrosis) ([place]) Encounter for medication monitoring  -IPF might be slowly progresive in past 1 year - Tolerting  low dose ofev well (Start sept 2023)   Plan  -check LFT 03/06/2022 - if not already done today -Continue nintedanib 100 mg twice daily with food   -Our pharmacy to try to get it for you through the [facility]  -This is a lower dose  -If you are  stable on this through summer/fall 2024 then we can escalate the dose  -Discussed side effect profile of diarrhea, rare diverticulitis and even rare cardiac issues but overall risk versus benefit -we have agreed to continue -Do spirometry and DLCO in 16 weeks - hold off clinical trials due to lack of interest  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 Partly due to season allergies +.- cough variant asthma; blood eosinophil high and aRAST allrgy test positive for tree, cottonw wood in 2023  Plan  - sTART [place] for albuterol as needed - continue advair at 1 puff twice daily  - albuterol as needed - Start over-the-counter Mucinex as needed -If problems persist can consider cough recent study  GERD  Plan  - cotinue GERD control with OTC prilosec   Followup 16 weeks 30-minute visit with Dr. Chase Caller but after PFT  -Symptom score and walking desaturation test at follow-up

## 2022-03-19 ENCOUNTER — Telehealth: Payer: Self-pay | Admitting: Internal Medicine

## 2022-03-19 DIAGNOSIS — J8283 Eosinophilic asthma: Secondary | ICD-10-CM

## 2022-03-20 NOTE — Telephone Encounter (Signed)
Called and spoke with Scott Price. Scott Price stated that she wanted to know if Dr. Chase Caller was going to be ordering the patients fasenra through the [place] and if so then she would need a prescription faxed over to her office so they can scan it in the patients chart. Scott Price also wanted to know if the patient should get the RSV vaccine or not.   MR, please advise.

## 2022-03-27 NOTE — Telephone Encounter (Signed)
Pharmacy team will send rx to [facility] for [place]. This can be escribed and clinicals faxed to [facility].  Knox Saliva, PharmD, MPH, BCPS, CPP Clinical Pharmacist (Rheumatology and Pulmonology)

## 2022-03-27 NOTE — Telephone Encounter (Signed)
  1) yes to get RSV vaccine  2) ok to get fasenral thruogh the [facility] - allergic eosinophilic asthma. Sendscript to [facility]   Latest Reference Range & Units 12/20/20 15:13 05/30/21 16:29 08/23/21 15:43 11/28/21 11:59  Eosinophils Absolute 0.0 - 0.7 K/uL 0.3 0.4 854 (H) 0.5  (H): Data is abnormally high

## 2022-03-27 NOTE — Telephone Encounter (Signed)
[facility] at [facility], no answer and no option to leave msg   If you want [place] to send rx for the fasenra, we will need directions for use. Please advise and then will print rx for you to sign so that we can fax to the [place], thanks!

## 2022-04-03 MED ORDER — FASENRA PEN 30 MG/ML ~~LOC~~ SOAJ: SUBCUTANEOUS | 2 refills | Status: DC

## 2022-04-03 MED ORDER — FASENRA PEN 30 MG/ML ~~LOC~~ SOAJ: 30.0000 mg | SUBCUTANEOUS | 1 refills | Status: DC

## 2022-04-03 NOTE — Telephone Encounter (Signed)
Received email from [facility] that patient is [place] patient. However referral was received from [facility]. I've sent return email for clarification  Knox Saliva, PharmD, MPH, BCPS, CPP Clinical Pharmacist (Rheumatology and Pulmonology)

## 2022-04-03 NOTE — Telephone Encounter (Signed)
Rx for [place] sent to [facility]. Clinicals sent via fax.  Phone: [phone] Fax: [phone]  Will f/u with patient next week in regards to new start visit.  Knox Saliva, PharmD, MPH, BCPS, CPP Clinical Pharmacist (Rheumatology and Pulmonology)

## 2022-04-04 MED ORDER — FASENRA PEN 30 MG/ML ~~LOC~~ SOAJ: 30.0000 mg | SUBCUTANEOUS | 1 refills | Status: DC

## 2022-04-04 MED ORDER — FASENRA PEN 30 MG/ML ~~LOC~~ SOAJ: SUBCUTANEOUS | 2 refills | Status: DC

## 2022-04-04 NOTE — Telephone Encounter (Addendum)
Rx for Berna Bue resent to [place]. Clinicals sent via fax  Phone: [phone] Fax: [phone]  Knox Saliva, PharmD, MPH, BCPS, CPP Clinical Pharmacist (Rheumatology and Pulmonology)

## 2022-04-11 ENCOUNTER — Telehealth: Payer: Self-pay | Admitting: Internal Medicine

## 2022-04-11 DIAGNOSIS — Z5181 Encounter for therapeutic drug level monitoring: Secondary | ICD-10-CM

## 2022-04-11 NOTE — Telephone Encounter (Signed)
Nintedanib (OFEV) 100 MG CAPS [id number] Pt wife is states pt is having symptoms of ALLERGIC reaction to the ofev, he has been having diarrhea

## 2022-04-11 NOTE — Telephone Encounter (Signed)
Called and spoke with patient's wife. She verbalized understanding and will have him to come by the office next Tuesday for bloodwork. She is aware of the lab hours.   Order for LFT placed.   Nothing further needed at time of call.

## 2022-04-11 NOTE — Telephone Encounter (Signed)
Called and spoke with patient's wife. She stated that the patient has been suffering with diarrhea for the past week. She believes it is coming from the Ofev medication. She denied any fevers, body aches, nausea or new skin rashes. He has noticed an increase in stomach cramping off and on but they are not severe cramps. He also does not have much of an appetite.   She confirmed that he is on Ofev '100mg'$  and taking only one capsule twice daily with food. The last dose he took was this morning. He has not tried any OTC medications yet.   MR, can you please advise? Thanks!

## 2022-04-11 NOTE — Telephone Encounter (Signed)
Definitely stop ofev Check LFT nesext week APP or my visit in April 2024 to regrou- If diarrhea goes away and he wants to rechllange - he should call back in 2 weeks       Latest Ref Rng & Units 03/06/2022    8:40 AM 11/28/2021    9:48 AM 05/30/2021    1:11 PM  PFT Results  FVC-Pre L 4.15  4.27  4.32   FVC-Predicted Pre % 104  107  107   Pre FEV1/FVC % % 81  81  83   FEV1-Pre L 3.35  3.48  3.57   FEV1-Predicted Pre % 117  121  123   DLCO uncorrected ml/min/mmHg 18.84  18.43  20.11   DLCO UNC% % 78  76  83   DLCO corrected ml/min/mmHg 18.84  18.43  20.11   DLCO COR %Predicted % 78  76  83   DLVA Predicted % 83  75  84        Current Outpatient Medications:    albuterol (VENTOLIN HFA) 108 (90 Base) MCG/ACT inhaler, Inhale 2 puffs into the lungs every 6 (six) hours as needed for wheezing or shortness of breath., Disp: 8 g, Rfl: 5   aspirin 81 MG EC tablet, Take 81 mg by mouth daily. , Disp: , Rfl:    Benralizumab (FASENRA PEN) 30 MG/ML SOAJ, Inject 1 mL (30 mg total) into the skin every 8 (eight) weeks. MAINTENANCE DOSE, Disp: 1 mL, Rfl: 1   Benralizumab (FASENRA PEN) 30 MG/ML SOAJ, Inject '30mg'$  into the skin at Week 0, Week 4, Week 8., Disp: 1 mL, Rfl: 2   Cholecalciferol (VITAMIN D3) 25 MCG (1000 UT) CAPS, Take 1,000 Units by mouth daily., Disp: , Rfl:    cyanocobalamin 1000 MCG tablet, Take 1,000 mcg by mouth daily., Disp: , Rfl:    empagliflozin (JARDIANCE) 10 MG TABS tablet, Take 5 mg by mouth in the morning and at bedtime., Disp: , Rfl:    ferrous sulfate 325 (65 FE) MG tablet, Take 325 mg by mouth daily with breakfast., Disp: , Rfl:    fluticasone furoate-vilanterol (BREO ELLIPTA) 100-25 MCG/ACT AEPB, Inhale 1 puff into the lungs daily. (Patient not taking: Reported on 03/06/2022), Disp: 60 each, Rfl: 5   fluticasone-salmeterol (WIXELA INHUB) 250-50 MCG/ACT AEPB, Inhale 1 puff into the lungs in the morning and at bedtime., Disp: 60 each, Rfl: 11   glipiZIDE (GLUCOTROL) 10 MG  tablet, Take 5 mg by mouth 2 (two) times daily before a meal., Disp: , Rfl:    losartan (COZAAR) 25 MG tablet, TAKE 1 TABLET (25 MG TOTAL) BY MOUTH DAILY., Disp: 90 tablet, Rfl: 3   magnesium oxide (MAG-OX) 400 MG tablet, Take 400 mg by mouth 2 (two) times daily., Disp: , Rfl:    meclizine (ANTIVERT) 25 MG tablet, Take 25 mg by mouth 3 (three) times daily as needed for dizziness., Disp: , Rfl:    metFORMIN (GLUCOPHAGE) 1000 MG tablet, Take 1 tablet (1,000 mg total) by mouth in the morning and at bedtime., Disp: , Rfl:    Multiple Vitamin (MULTI-VITAMIN) tablet, Take 1 tablet by mouth daily., Disp: , Rfl:    Nintedanib (OFEV) 100 MG CAPS, Take 1 capsule (100 mg total) by mouth 2 (two) times daily. Clinicals have been faxed, Disp: 60 capsule, Rfl: 5   omeprazole (PRILOSEC) 20 MG capsule, Take 20 mg by mouth See admin instructions. Every other night, Disp: , Rfl:    rosuvastatin (CRESTOR) 40 MG tablet, Take  40 mg by mouth at bedtime., Disp: , Rfl:

## 2022-04-14 NOTE — Telephone Encounter (Signed)
Spoke with patient's wife regarding Berna Bue. Patient has not yet received medication from [facility]. Will f/u on Thurs/Friday to determine if med received.  Advised of new start visit in clinic for first injection for training and reaction monitoring but [facility].  Knox Saliva, PharmD, MPH, BCPS, CPP Clinical Pharmacist (Rheumatology and Pulmonology)

## 2022-04-15 ENCOUNTER — Other Ambulatory Visit (INDEPENDENT_AMBULATORY_CARE_PROVIDER_SITE_OTHER): Payer: No Typology Code available for payment source

## 2022-04-15 DIAGNOSIS — Z5181 Encounter for therapeutic drug level monitoring: Secondary | ICD-10-CM | POA: Diagnosis not present

## 2022-04-15 LAB — HEPATIC FUNCTION PANEL
ALT: 27 U/L (ref 0–53)
AST: 29 U/L (ref 0–37)
Albumin: 4 g/dL (ref 3.5–5.2)
Alkaline Phosphatase: 48 U/L (ref 39–117)
Bilirubin, Direct: 0.1 mg/dL (ref 0.0–0.3)
Total Bilirubin: 0.4 mg/dL (ref 0.2–1.2)
Total Protein: 6.5 g/dL (ref 6.0–8.3)

## 2022-04-22 NOTE — Telephone Encounter (Signed)
ATC patient to determine if Berna Bue received at home. Unable to reach. Left VM requesting return call  Knox Saliva, PharmD, MPH, BCPS, CPP Clinical Pharmacist (Rheumatology and Pulmonology)

## 2022-04-24 IMAGING — DX DG CHEST 2V
2 series · 2 of 2 positions shown · non-contrast
Comparison: October 18, 2019

CLINICAL DATA: Chest pain and shortness of breath

EXAM:
CHEST - 2 VIEW

[chest lat]
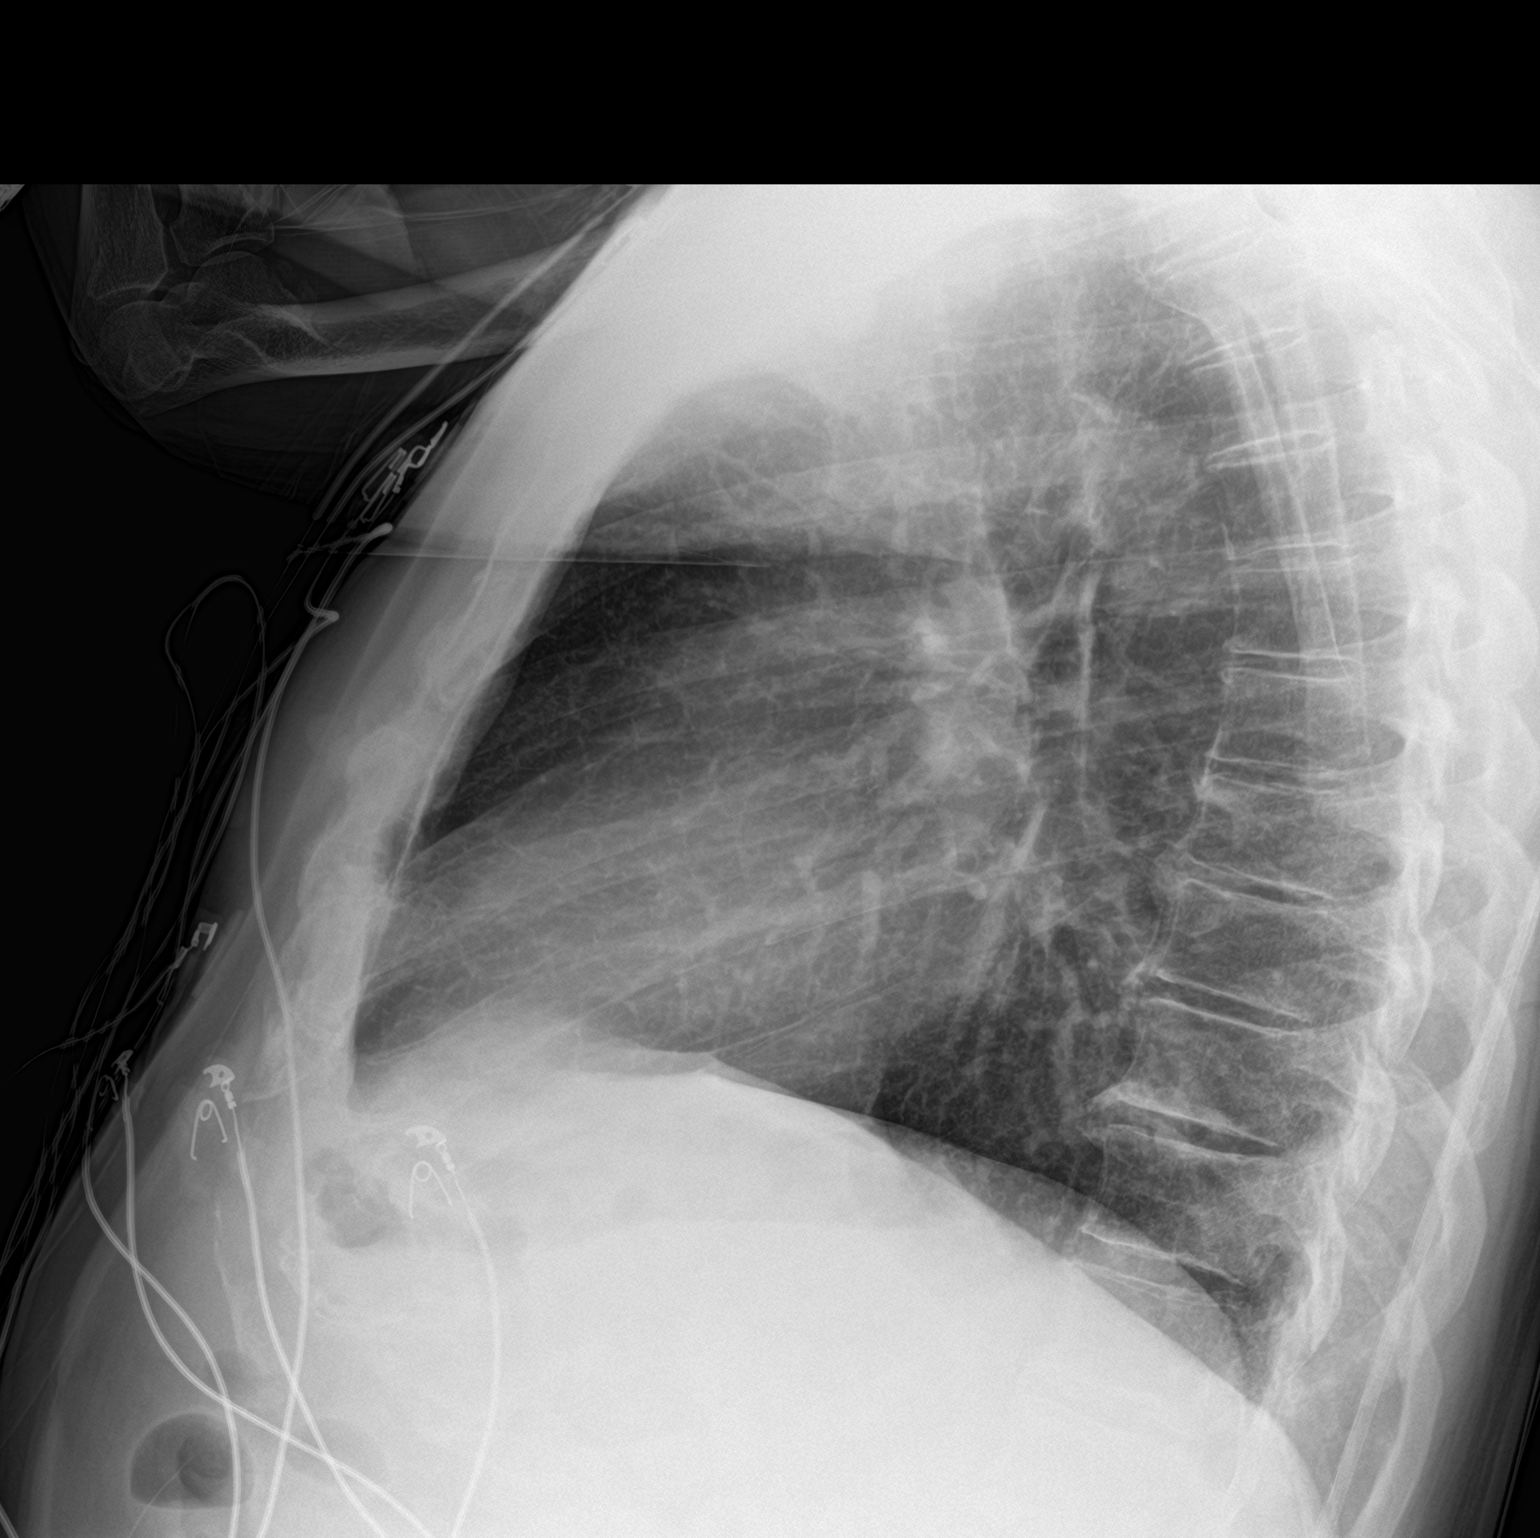

[chest ap]
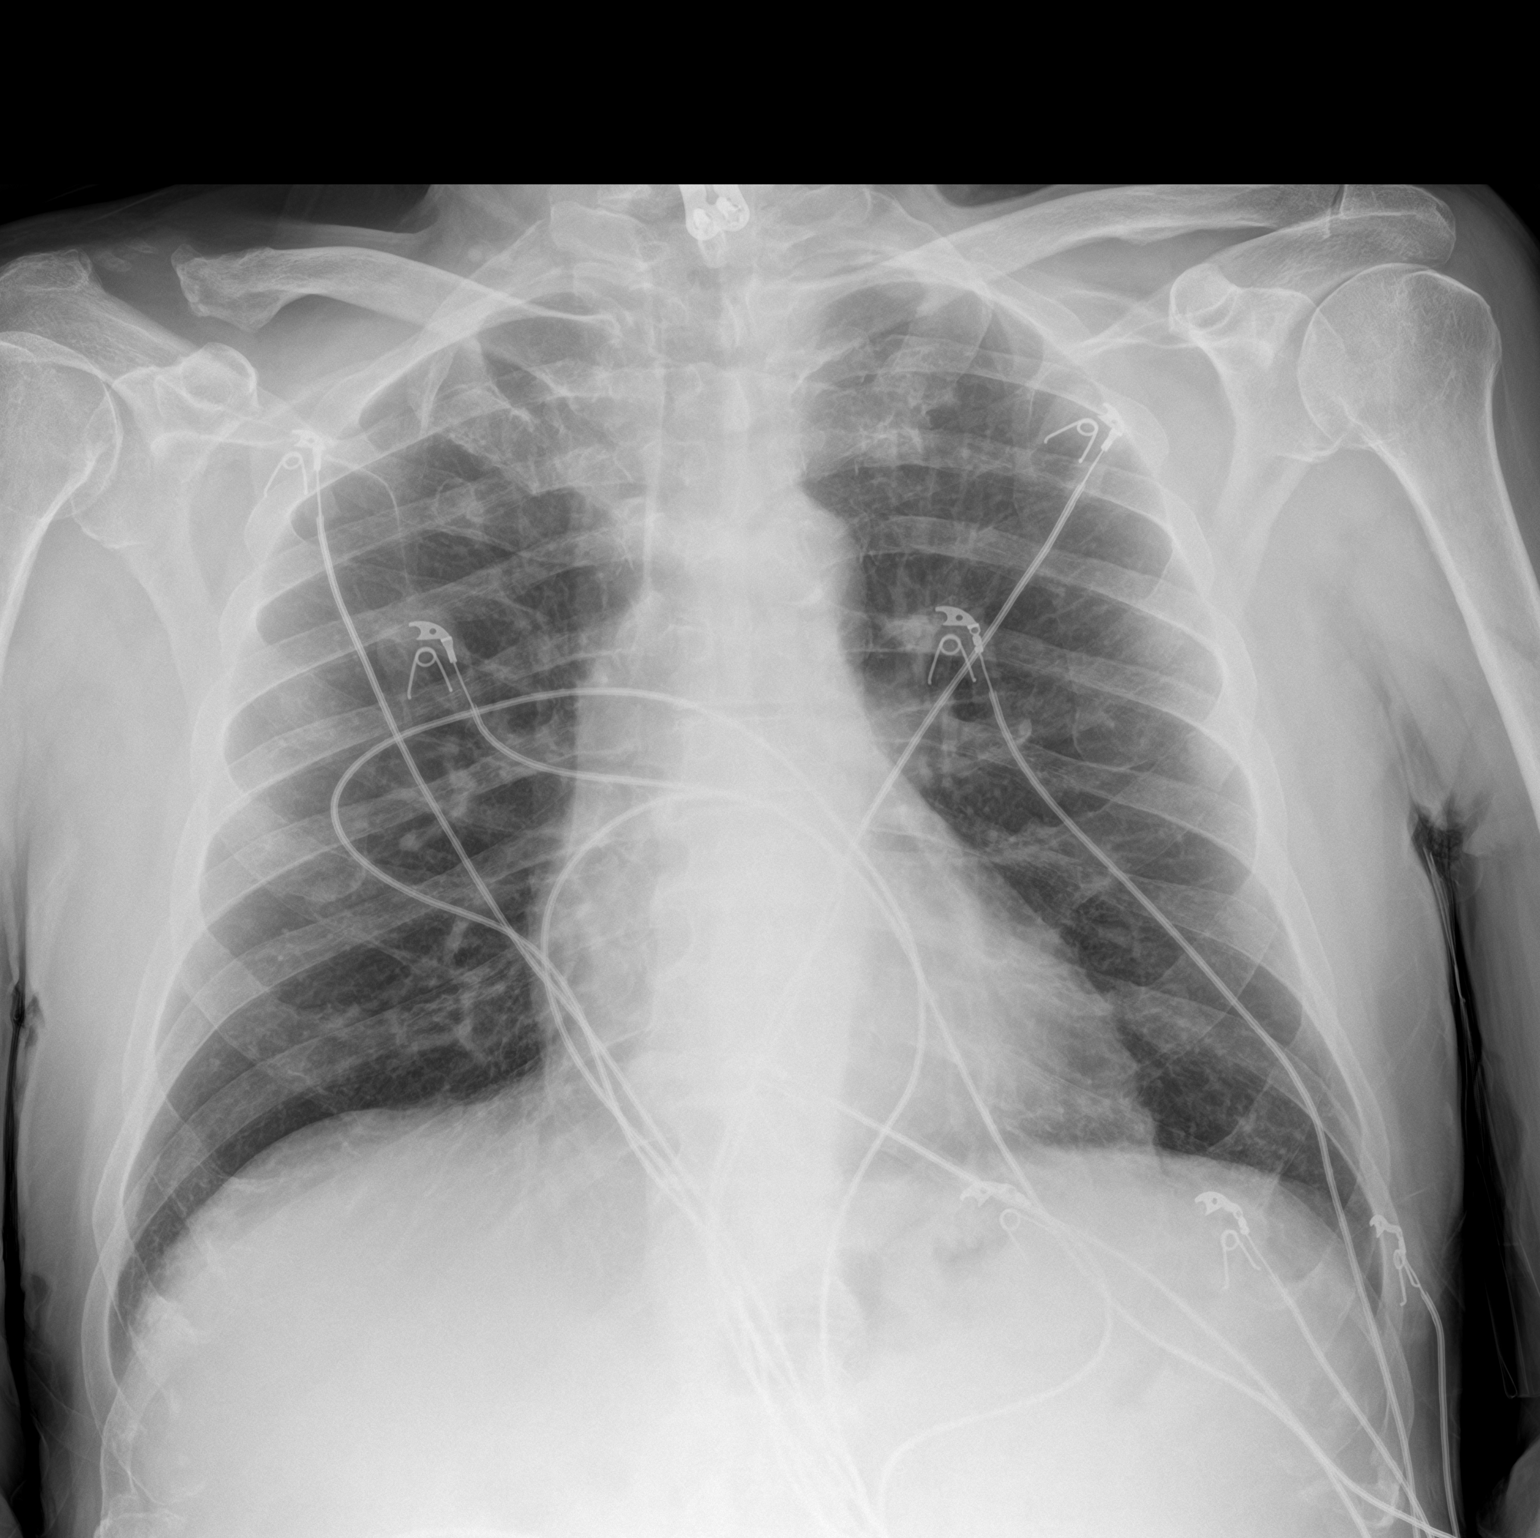

[2 of 2 positions shown; findings below may reference images not displayed]

FINDINGS: Lungs are clear. Heart size and pulmonary vascularity are normal. No
adenopathy. There is postoperative change in the lower cervical
region. There is evidence of old trauma involving the lateral right
clavicle. There is degenerative change in the thoracic spine. No
pneumothorax.
IMPRESSION: Lungs clear.  Cardiac silhouette within normal limits.

## 2022-04-28 NOTE — Telephone Encounter (Signed)
Patient scheduled for Fasenra new start tomorrow 04/29/22. Will use sample. Patient has been advised to keep his supply at home in refrigerator.  Knox Saliva, PharmD, MPH, BCPS, CPP Clinical Pharmacist (Rheumatology and Pulmonology)

## 2022-04-29 ENCOUNTER — Ambulatory Visit: Payer: No Typology Code available for payment source | Admitting: Pharmacist

## 2022-04-29 DIAGNOSIS — Z7189 Other specified counseling: Secondary | ICD-10-CM

## 2022-04-29 DIAGNOSIS — D721 Eosinophilia, unspecified: Secondary | ICD-10-CM

## 2022-04-29 DIAGNOSIS — J8283 Eosinophilic asthma: Secondary | ICD-10-CM

## 2022-04-29 NOTE — Patient Instructions (Signed)
Your next [facility] dose is due on 05/27/22, 06/24/22, and every 8 weeks thereafter (starting on 08/19/22)  CONTINUE Fluticasone-salmeterol 250-50 (1 puff twice daily)  Your prescription will be shipped from [facility]. Their phone number is [phone]  Please call to schedule shipment and confirm address. They will mail your medication to your home.  You will need to be seen by your provider in 3 to 4 months to assess how FASENRA is working for you. Please ensure you have a follow-up appointment scheduled in July or August 2024. Call our clinic if you need to make this appointment.  How to manage an injection site reaction: Remember the 5 C's: COUNTER - leave on the counter at least 30 minutes but up to overnight to bring medication to room temperature. This may help prevent stinging COLD - place something cold (like an ice gel pack or cold water bottle) on the injection site just before cleansing with alcohol. This may help reduce pain CLARITIN - use Claritin (generic name is loratadine) for the first two weeks of treatment or the day of, the day before, and the day after injecting. This will help to minimize injection site reactions CORTISONE CREAM - apply if injection site is irritated and itching CALL ME - if injection site reaction is bigger than the size of your fist, looks infected, blisters, or if you develop hives

## 2022-04-29 NOTE — Progress Notes (Unsigned)
HPI Patient presents today to [facility] to see pharmacy team for [facility] new start. He has cough-variant asthma. He is accompanied by his wife Darlene. 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  Last seen by Dr. Chase Caller on 03/06/22  He has discontinued Ofev - was unable to tolerate even at low dose of 100mg  twice daily. He does have some supply at home so if willing to rechallenge at home, he will retain supply  Respiratory Medications Current regimen: Fluticasone-salmeterol 250-50 (1 puff twice daily) Patient reports no known adherence challenges  OBJECTIVE Allergies  Allergen Reactions   Doxycycline Shortness Of Breath and Rash   Lisinopril Cough   Amoxicillin Rash   Lasix [Furosemide] Rash   Pirfenidone Rash   Prednisone     High blood sugar   Sulfa Antibiotics Rash    Headache/ Chest tightness/ stiff neck    Outpatient Encounter Medications as of 04/29/2022  Medication Sig   albuterol (VENTOLIN HFA) 108 (90 Base) MCG/ACT inhaler Inhale 2 puffs into the lungs every 6 (six) hours as needed for wheezing or shortness of breath.   aspirin 81 MG EC tablet Take 81 mg by mouth daily.    Benralizumab (FASENRA PEN) 30 MG/ML SOAJ Inject 1 mL (30 mg total) into the skin every 8 (eight) weeks. MAINTENANCE DOSE   Benralizumab (FASENRA PEN) 30 MG/ML SOAJ Inject 30mg  into the skin at Week 0, Week 4, Week 8.   Cholecalciferol (VITAMIN D3) 25 MCG (1000 UT) CAPS Take 1,000 Units by mouth daily.   cyanocobalamin 1000 MCG tablet Take 1,000 mcg by mouth daily.   empagliflozin (JARDIANCE) 10 MG TABS tablet Take 5 mg by mouth in the morning and at bedtime.   ferrous sulfate 325 (65 FE) MG tablet Take 325 mg by mouth daily with breakfast.   fluticasone-salmeterol (WIXELA INHUB) 250-50 MCG/ACT AEPB Inhale 1 puff into the lungs in the morning and at bedtime.   glipiZIDE (GLUCOTROL) 10 MG tablet  Take 5 mg by mouth 2 (two) times daily before a meal.   losartan (COZAAR) 25 MG tablet TAKE 1 TABLET (25 MG TOTAL) BY MOUTH DAILY.   magnesium oxide (MAG-OX) 400 MG tablet Take 400 mg by mouth 2 (two) times daily.   meclizine (ANTIVERT) 25 MG tablet Take 25 mg by mouth 3 (three) times daily as needed for dizziness.   metFORMIN (GLUCOPHAGE) 1000 MG tablet Take 1 tablet (1,000 mg total) by mouth in the morning and at bedtime.   Multiple Vitamin (MULTI-VITAMIN) tablet Take 1 tablet by mouth daily.   omeprazole (PRILOSEC) 20 MG capsule Take 20 mg by mouth See admin instructions. Every other night   rosuvastatin (CRESTOR) 40 MG tablet Take 40 mg by mouth at bedtime.   [DISCONTINUED] fluticasone furoate-vilanterol (BREO ELLIPTA) 100-25 MCG/ACT AEPB Inhale 1 puff into the lungs daily. (Patient not taking: Reported on 03/06/2022)   [DISCONTINUED] Nintedanib (OFEV) 100 MG CAPS Take 1 capsule (100 mg total) by mouth 2 (two) times daily. Clinicals have been faxed   No facility-administered encounter medications on file as of 04/29/2022.     Immunization History  Administered Date(s) Administered   Covid-19, Mrna,Vaccine(Spikevax)83yrs and older 03/19/2022   Fluad Quad(high Dose 65+) 11/18/2021   Influenza Split 11/22/2012   Influenza, High Dose Seasonal PF 01/09/2020, 11/15/2020   Moderna Sars-Covid-2 Vaccination 04/11/2019, 05/09/2019, 01/17/2020, 06/26/2020   Pneumococcal Polysaccharide-23 09/24/2020     PFTs  Latest Ref Rng & Units 03/06/2022    8:40 AM 11/28/2021    9:48 AM 05/30/2021    1:11 PM  PFT Results  FVC-Pre L 4.15  4.27  4.32   FVC-Predicted Pre % 104  107  107   Pre FEV1/FVC % % 81  81  83   FEV1-Pre L 3.35  3.48  3.57   FEV1-Predicted Pre % 117  121  123   DLCO uncorrected ml/min/mmHg 18.84  18.43  20.11   DLCO UNC% % 78  76  83   DLCO corrected ml/min/mmHg 18.84  18.43  20.11   DLCO COR %Predicted % 78  76  83   DLVA Predicted % 83  75  84      Eosinophils Most recent  blood eosinophil count was 500 cells/microL taken on 11/28/21.   IgE: 104 on 11/28/21  Assessment   Biologics training for benralizumab Berna Bue)  Goals of therapy: Mechanism of action: humanized afucosylated, monoclonal antibody (IgG1, kappa) that binds to the alpha subunit of the interleukin-5 receptor. IL-5 is the major cytokine responsible for the growth and differentiation, recruitment, activation, and survival of eosinophils (a cell type associated with inflammation and an important component in the pathogenesis of asthma) Reviewed that Berna Bue is add-on medication and patient must continue maintenance inhaler regimen. Response to therapy: may take 4 months to determine efficacy. Discussed that patients generally feel improvement sooner than 4 months.  Side effects: antibody development (13%), headache (8%), pharyngitis (5%), injection site reaction (2.2%)  Dose: 30 mg subcutaneously at Week 0, Week 4, Week 8, then 30mg  every 8 weeks thereafter  Administration/Storage:   Reviewed administration sites of thigh or abdomen (at least 2-3 inches away from abdomen). Reviewed the upper arm is only appropriate if caregiver is administering injection  Do not shake the pen/syringe as this could lead to product foaming or precipitation.  Reviewed storage of medication in refrigerator. Reviewed that Berna Bue can be stored at room temperature in unopened carton for up to 14 days.  Side effects: headache (19%), injection site reaction (7-15%), antibody development (6%), backache (5%), fatigue (5%)  Access: Approval of Fasenra through: [employer] Already received shipment of [place] at home  Patient self-administered Fasenra 30mg /mL in right lower abdomen using sample Fasenra 30mg /mL autoinjector pen NDC: [id number]: [id number] Expiration: 03/2024  Patient monitored for 30 minutes for adverse reaction.  Patient tolerated well. Injection site checked and no redness or  swelling noted. Patient denies itchiness and irritation  Medication Reconciliation  A drug regimen assessment was performed, including review of allergies, interactions, disease-state management, dosing and immunization history. Medications were reviewed with the patient, including name, instructions, indication, goals of therapy, potential side effects, importance of adherence, and safe use.  Drug interaction(s): none noted  PLAN Continue Fasenra 30mg  at Week 4 on 05/27/22 and Week 8 on 06/24/22. Then continue Fasenra 30mg  every 8 weeks thereafter starting on 08/19/22. Rx sent to:  [facility] . Patient provided with pharmacy phone number and advised to call at least 10-14 days before dose is due to schedule shipment to home Continue maintenance asthma regimen of: Fluticasone-salmeterol 250-50 (1 puff twice daily)  All questions encouraged and answered.  Instructed patient to reach out with any further questions or concerns.  Thank you for allowing pharmacy to participate in this patient's care.  This appointment required 45 minutes of patient care (this includes precharting, chart review, review of results, face-to-face care, etc.).  Knox Saliva, PharmD, MPH,  BCPS, CPP Clinical Pharmacist (Rheumatology and Pulmonology)

## 2022-06-01 IMAGING — DX DG CHEST 2V
2 series · 2 of 2 positions shown · non-contrast
Comparison: CT chest 12/19/2019.  Chest x-ray 11/19/2019.

CLINICAL DATA: Pacemaker.  Unable to raise left arm overhead.

EXAM:
CHEST - 2 VIEW

[chest pa]
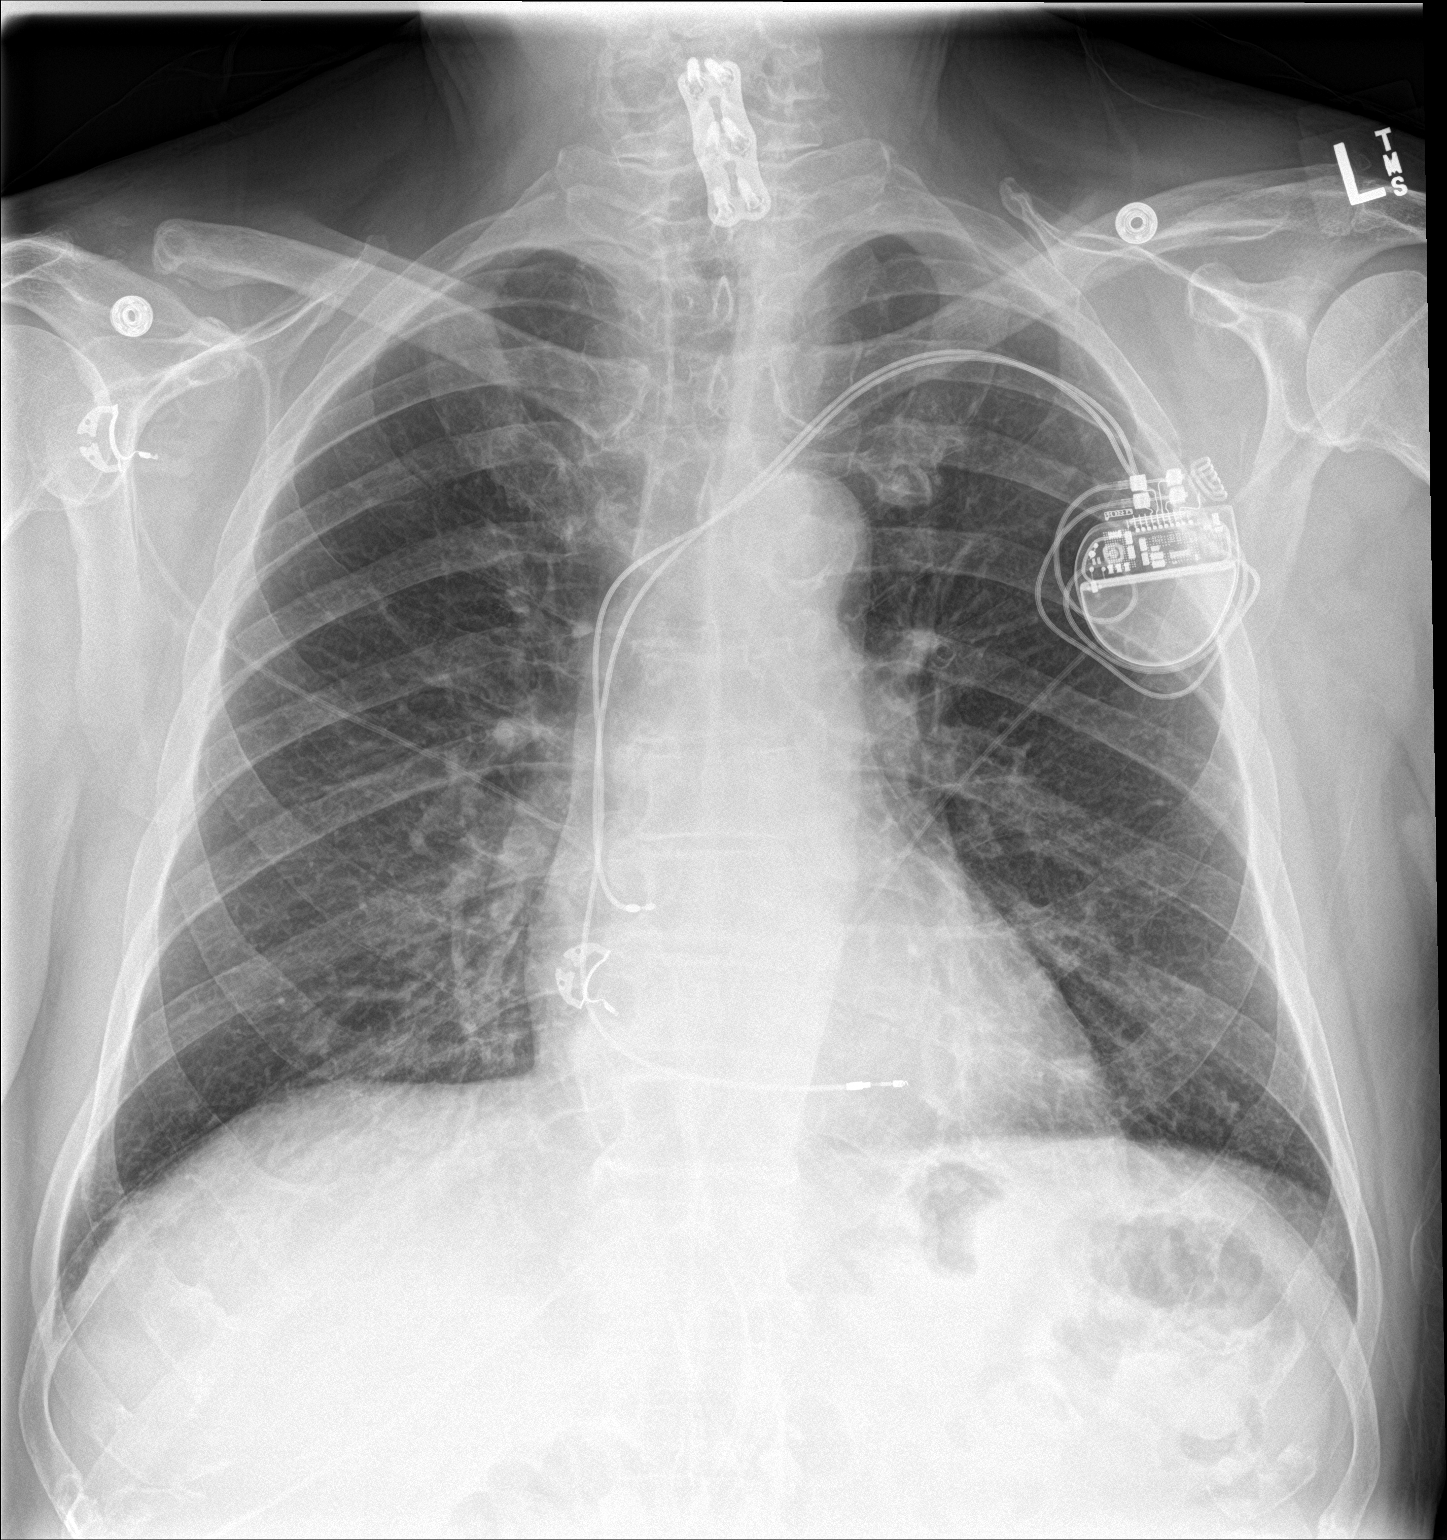

[chest lat]
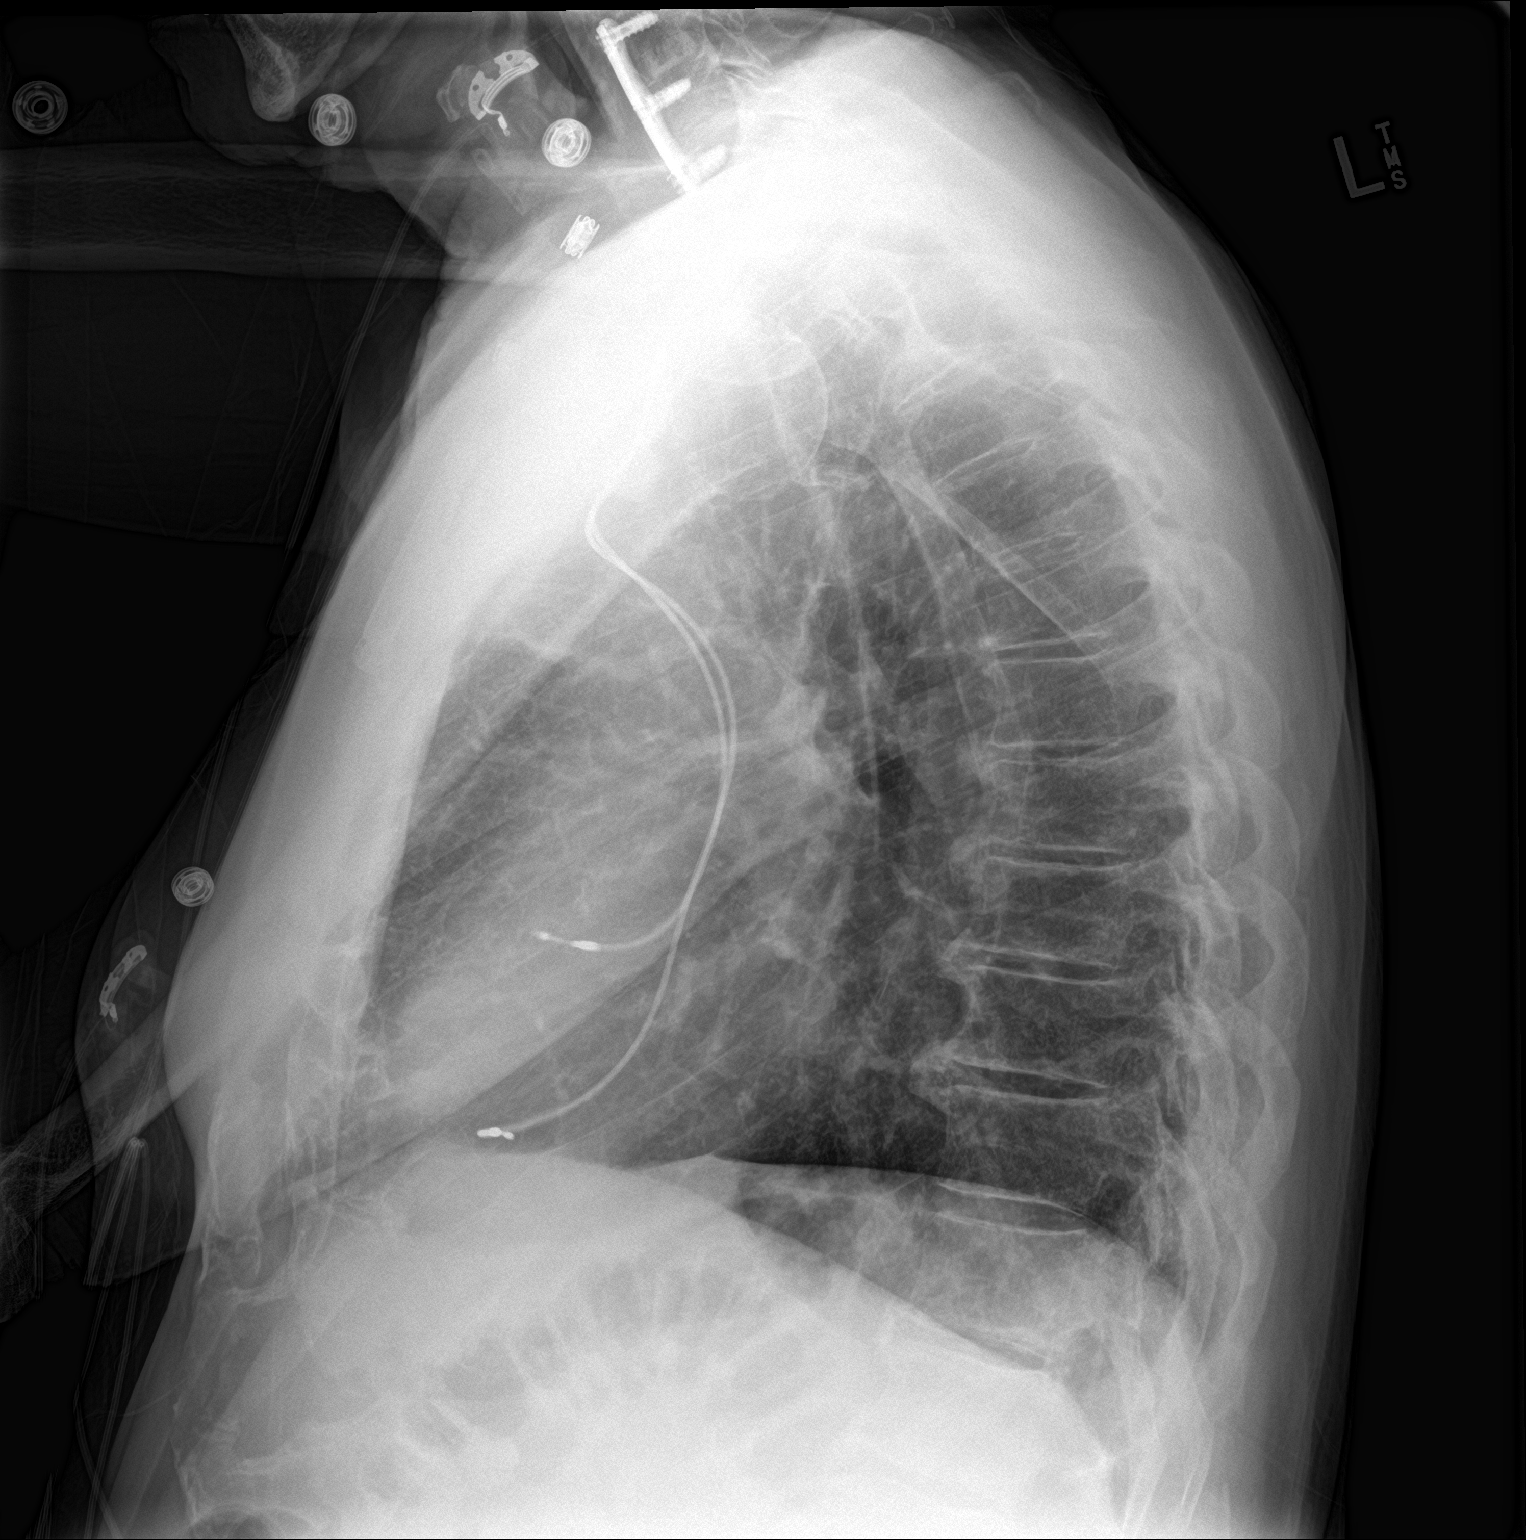

[2 of 2 positions shown; findings below may reference images not displayed]

FINDINGS: Cardiac pacer noted with lead tips over the right atrium right
ventricle. No pneumothorax. Mediastinum hilar structures normal. Low
lung volumes with mild bibasilar atelectasis. No pleural effusion.
Prior cervicothoracic spine fusion. Degenerative change thoracic
spine. No acute bony abnormality.
IMPRESSION: 1. Cardiac pacer with lead tips over the right atrium and right
ventricle. No pneumothorax.
2. Low lung volumes with mild bibasilar atelectasis.

## 2022-06-24 ENCOUNTER — Ambulatory Visit (INDEPENDENT_AMBULATORY_CARE_PROVIDER_SITE_OTHER): Payer: Medicare HMO | Admitting: Internal Medicine

## 2022-06-24 ENCOUNTER — Ambulatory Visit: Payer: Medicare HMO | Admitting: Internal Medicine

## 2022-06-24 ENCOUNTER — Other Ambulatory Visit: Payer: Self-pay

## 2022-06-24 DIAGNOSIS — R0609 Other forms of dyspnea: Secondary | ICD-10-CM

## 2022-06-24 DIAGNOSIS — J84112 Idiopathic pulmonary fibrosis: Secondary | ICD-10-CM | POA: Diagnosis not present

## 2022-06-24 DIAGNOSIS — J8283 Eosinophilic asthma: Secondary | ICD-10-CM

## 2022-06-24 NOTE — Patient Instructions (Signed)
Full PFT performed today. °

## 2022-06-24 NOTE — Progress Notes (Signed)
Full PFT performed today. °

## 2022-06-27 LAB — PULMONARY FUNCTION TEST
DL/VA % pred: 82 %
DL/VA: 3.27 ml/min/mmHg/L
DLCO cor % pred: 79 %
DLCO cor: 19.22 ml/min/mmHg
DLCO unc % pred: 79 %
DLCO unc: 19.22 ml/min/mmHg
FEF 25-75 Post: 4.15 L/sec
FEF 25-75 Pre: 3.15 L/sec
FEF2575-%Change-Post: 32 %
FEF2575-%Pred-Post: 204 %
FEF2575-%Pred-Pre: 154 %
FEV1-%Change-Post: 0 %
FEV1-%Pred-Post: 121 %
FEV1-%Pred-Pre: 120 %
FEV1-Post: 3.47 L
FEV1-Pre: 3.45 L
FEV1FVC-%Change-Post: 2 %
FEV1FVC-%Pred-Pre: 112 %
FEV6-%Change-Post: -1 %
FEV6-%Pred-Post: 111 %
FEV6-%Pred-Pre: 113 %
FEV6-Post: 4.15 L
FEV6-Pre: 4.23 L
FEV6FVC-%Change-Post: 0 %
FEV6FVC-%Pred-Post: 106 %
FEV6FVC-%Pred-Pre: 106 %
FVC-%Change-Post: -2 %
FVC-%Pred-Post: 104 %
FVC-%Pred-Pre: 106 %
FVC-Post: 4.15 L
Post FEV1/FVC ratio: 84 %
Post FEV6/FVC ratio: 100 %
Pre FEV1/FVC ratio: 81 %
Pre FEV6/FVC Ratio: 100 %
RV % pred: 125 %
RV: 3.2 L
TLC % pred: 107 %
TLC: 7.33 L

## 2022-07-02 NOTE — Patient Instructions (Signed)
  IPF (idiopathic pulmonary fibrosis) (HCC) Encounter for medication monitoring  -IPF might be slowly progresive in past 1 year - Tolerting  low dose ofev well (Start sept 2023)   Plan  -check LFT 03/06/2022 - if not already done today -Continue nintedanib 100 mg twice daily with food   -Our pharmacy to try to get it for you through the [facility]  -This is a lower dose  -If you are  stable on this through summer/fall 2024 then we can escalate the dose  -Discussed side effect profile of diarrhea, rare diverticulitis and even rare cardiac issues but overall risk versus benefit -we have agreed to continue -Do spirometry and DLCO in 16 weeks - hold off clinical trials due to lack of interest  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 Partly due to season allergies +.- cough variant asthma; blood eosinophil high and aRAST allrgy test positive for tree, cottonw wood in 2023  Plan  - [facility]  - Okay for albuterol as needed - continue advair at 1 puff twice daily  - albuterol as needed - Start over-the-counter Mucinex as needed -If problems persist can consider cough recent study  GERD  Plan  - cotinue GERD control with OTC prilosec   Followup 16 weeks 30-minute visit with Dr. Marchelle Gearing but after PFT  -Symptom score and walking desaturation test at follow-up

## 2022-07-02 NOTE — Progress Notes (Unsigned)
IOV august 2022 for SOB by [facility]  Subjective:   [name] GENDER: male DOB: [birth year], MRN: [id number]  Chief Complaint  Patient presents with   Consult    Pt is being referred due to chronic cough.  Pt states that he does have complaints of chest tightness and SOB with activities.      PMH DMII, high cholesterol. He has recurrent issues with chest tightness. Dry cough, usually non-productive. Occasionally in the morning her has productive cough. He has gerd and takes a PPI. If he does any activity it makes the episodes worse. Hot shower and activity makes it worse. No seasonal changes that he notices. He does have sinus issues at times. Retired at a Programme researcher, broadcasting/film/video. Was in [employer], [place] 1966 - 1971. No pets in the house. PFTS at [facility] ratio normal, FEV1 2L, normal dlco, normal tlc. He stays active and still working part time. He has used albuterol in the past prior to getting his pacemaker.   CT scan of the chest was completed in November 2021 which revealed lower lobe honeycombing and early evidence of interstitial lung disease. xxxxxx  OV 11/23/2020: here to day for follow up. Unfortunately he did not have his CT Chest complete. It was set up through the [place] and he was not prepared to pay for it. His cough is better. His chest tightness is better as well.  Has been using his inhalers.  OV 12/20/2020 -transfer of care to the ILD center to Dr. Marchelle Gearing.  Referred by Dr. Audie Box  Subjective:  [name], male , DOB: [birth year] , age 77 y.o. , MRN: [id number] , ADDRESS: [address] [place] [address] PCP [facility] Patient Care Team: [facility] as PCP - General Rennis Golden Lisette Abu, MD as PCP - Cardiology (Cardiology)  This Provider for this visit: Treatment Team:  Attending Provider: Kalman Shan, MD    12/20/2020 -   Chief Complaint  Patient presents with   Follow-up    Chest  tightness     [name] 77 y.o. -seen in August 2022 in October 2022 by Dr. Tonia Brooms.  He is here with his wife.  He is a Cytogeneticist. Dr Shelle Iron at the [facility] in the area is retired.  Therefore he is seeing [place].  He reports insidious onset of shortness of breath at least a year.  This started around the time he had his pacemaker.  The pacemaker did get rid of his syncope but not the shortness of breath.  Is present on exertion relieved by rest.  Recently Dr. Tonia Brooms started [facility] and this is helped his chest tightness on exertion but he still continues have significant shortness of breath with exertion and cough.  Documented below.  He is aware that he has pulmonary fibrosis.  His symptoms are somewhat progressive.  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  Shared with the family his results.  Review of the records indicate that he has not had serology so far.  An ILD questionnaire packet was mailed out to him but he has not received it yet so we do not have the data.  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  His other medications include Breo and omeprazole.  He has a history of diverticulitis.  No coronary artery disease but he has a pacemaker.  He is not on anticoagulation  he is on aspirin only.  His walking desaturation test shows adequate oxygenation with minimal pulse ox drop.   [facility] Comprehensive ILD Questionnaire -dictated on 03/19/2021  Symptoms:  x    Past Medical History :  -Denies any asthma or COPD or heart failure.  Denies collagen vascular disease..  Denies sleep apnea -Does have acid reflux -Does have diabetes.  Intolerant to prednisone.  Makes him very hyperglycemic -Intolerant to gabapentin -Never had COVID   ROS:  -Does have exertional fatigue -10 pound weight loss prior to the November 2022 consult -Dry eyes present -Does have occasional acid reflux which is fairly well controlled with  omeprazole  FAMILY HISTORY of LUNG DISEASE:  -Mom had asthma  PERSONAL EXPOSURE HISTORY:  -Smoke cigarettes stop smoking 23 years ago.  Never did vaping in the past smoked some amount of marijuana but not much.  No cocaine use no intravenous drug use  HOME  EXPOSURE and HOBBY DETAILS :  -[place] in the urban house [place] 1997.  The house was 78 years old.  No dampness no mold or mildew.  Does not use CPAP.  Extensive organic antigen exposure history is negative  OCCUPATIONAL HISTORY (122 questions) : -He is a veteran but no agent orange exposure.  Has done gardening.  Has done woodwork.  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  Has done a garage repair.  Has done furniture work.  Has done some car Cabin crew work.  Has done machine operating.  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  Intolerant to prednisone. -For postnasal drip has tried nasal spray saline and steroid nasal spray and it does not work  INVESTIGATIONS: -Below   CT Chest data - HRCT 11/27/20  Study Result  Narrative & Impression  CLINICAL DATA:  77 year old male with history of interstitial lung disease. Follow-up study.   EXAM: CT CHEST WITHOUT CONTRAST   TECHNIQUE: Multidetector CT imaging of the chest was performed following the standard protocol without intravenous contrast. High resolution imaging of the lungs, as well as inspiratory and expiratory imaging, was performed.   COMPARISON:  Chest CT 12/19/2019.   FINDINGS: Cardiovascular: Heart size is normal. There is no significant pericardial fluid, thickening or pericardial calcification. There is aortic atherosclerosis, as well as atherosclerosis of the great vessels of the mediastinum and the coronary arteries, including calcified atherosclerotic plaque in the left main, left anterior descending, left circumflex and right coronary  arteries. Severe calcifications of the aortic valve and mitral annulus. Left-sided pacemaker device in place with lead tips terminating in the right atrial appendage and right ventricular apex.   Mediastinum/Nodes: No pathologically enlarged mediastinal or hilar lymph nodes. Please note that accurate exclusion of hilar adenopathy is limited on noncontrast CT scans. Esophagus is unremarkable in appearance. No axillary lymphadenopathy.   Lungs/Pleura: High-resolution images demonstrate widespread but patchy areas of peripheral predominant ground-glass attenuation, septal thickening, subpleural reticulation, cylindrical bronchiectasis and peripheral bronchiolectasis. Findings appear progressive compared to the prior study. Findings have a definitive craniocaudal gradient. No frank honeycombing is confidently identified at this time. Inspiratory and expiratory imaging demonstrates some very mild air trapping in the lung bases indicative of mild small airways disease. No acute consolidative airspace disease. No pleural effusions. No suspicious appearing pulmonary nodules or masses are noted.   Upper Abdomen: Aortic atherosclerosis.   Musculoskeletal: Orthopedic fixation hardware in the lower cervical spine incidentally  noted. There are no aggressive appearing lytic or blastic lesions noted in the visualized portions of the skeleton.   IMPRESSION: 1. Progressively worsening interstitial lung disease, 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 2. Aortic atherosclerosis, in addition to left main and 3 vessel coronary artery disease. Assessment for potential risk factor modification, dietary therapy or pharmacologic therapy may be warranted, if clinically indicated. 3. There are calcifications of the aortic valve and mitral annulus. Echocardiographic correlation for evaluation of potential valvular dysfunction may be  warranted if clinically indicated.   Aortic Atherosclerosis (ICD10-I70.0).     Electronically Signed   By: Trudie Reed M.D.   On: 11/28/2020 12:46    No results found.  Cardoiac cath  oct 2021 Mid RCA lesion is 25% stenosed. Prox Cx to Mid Cx lesion is 25% stenosed. Prox LAD to Mid LAD lesion is 25% stenosed. The left ventricular systolic function is normal. LV end diastolic pressure is normal. The left ventricular ejection fraction is 55-65% by visual estimate. There is no aortic valve stenosis.   Nonobstructive CAD.  Continue preventive therapy  PFT  No flowsheet data found.   Latest Reference Range & Units 11/19/19 12:17  Creatinine 0.61 - 1.24 mg/dL 1.61    Latest Reference Range & Units 11/19/19 12:17  Hemoglobin 13.0 - 17.0 g/dL 09.6       OV 0/05/5407  Subjective:  [name], male , DOB: Jun 30, 1945 , age 21 y.o. , MRN: [id number] , AD[address]ESS: [address] [place] [address] PCP [facility] Patient Care Team: [facility] as PCP - General Rennis Golden Lisette Abu, MD as PCP - Cardiology (Cardiology)  This Provider for this visit: Treatment Team:  Attending Provider: Kalman Shan, MD  Type of visit:Video Circumstance: COVID-19 national emergency Identification of patient Scott Price with 11/02/1945 and MRN [id number] - 2 person identifier Risks: Risks, benefits, limitations of telephone visit explained. Patient understood and verbalized agreement to proceed Anyone else on call: his wife on side Patient location: [address] [place] [address] This provider location: [address]; [place]; [place] [address]. [facility]. [phone]    02/08/2021 - clinical IPF followup - to assess esbriet uptake    [name] 77 y.o. - only on 2nd week of esbreit. On 2 pills tid. Sunday 02/10/21 ends 2pills tid. On Monday 02/11/21 will go to 3 pills tid. No side effects. Spacing 5-6h apart .  Does not wear sunscreen bu wears hat and long sleeve clothes. Does have sunscren. Reviewd serology and cbc/chmeistry - these are normal  However, reports chest congestion. Hacks and coughs. Sputum +. Says it is baseline few months ago. Rates  it as 4-5 of 5.Does not know sputum color - but thinks it is clear. Not wheezing. Clears throat. fEels he says he cannot take prednisone - even 5 days - drives him hyperglucemic 400s  AUTOIMMUNE 12/20/20   Latest Reference Range & Units 11/19/19 12:17 12/14/19 09:41 04/27/20 15:59 12/20/20 15:13  Sed Rate 0 - 20 mm/hr    7  Glucose 70 - 99 mg/dL 253 (H) 664 (H) 403 (H) 166 (H)  Anti Nuclear Antibody (ANA) NEGATIVE     NEGATIVE  Angiotensin-Converting Enzyme 9 - 67 U/L    20  ds DNA Ab IU/mL    <1  RA Latex Turbid. <14 IU/mL    <14  SSA (Ro) (  ENA) Antibody, IgG <1.0 NEG AI    <1.0 NEG  SSB (La) (ENA) Antibody, IgG <1.0 NEG AI    <1.0 NEG  Scleroderma (Scl-70) (ENA) Antibody, IgG <1.0 NEG AI    <1.0 NEG  QUANTIFERON-TB GOLD PLUS     Rpt  (H): Data is abnormally high Rpt: View report in Results Review for more information   PFT  No flowsheet data found  OV 03/19/2021  Subjective:  [name], male , DOB: [birth year] , age 12 y.o. , MRN: [id number] , ADDRESS: [address] [place] [address] PCP [facility] Patient Care Team: [facility] as PCP - General Rennis Golden Lisette Abu, MD as PCP - Cardiology (Cardiology)  This Provider for this visit: Treatment Team:  Attending Provider: Kalman Shan, MD    03/19/2021 -   Chief Complaint  Patient presents with   Follow-up    Pt states he has been doing okay since last visit. States he does still become SOB with exertion and still has complaints of coughing that is worse in the mornings.   Diagnois is based on age > 73, ex smoker, progressive disease, probable UIP pattern on CT., negative serolgy and ILD qquewtionaire and acid reflux  -Start pirfenidone  around Christmas 2022 -Significant out of proportion cough  [name] 77 y.o. -returns for follow-up.  I reviewed his ILD questionnaire and his exposures consistent with IPF.  Currently he is on pirfenidone.  He is now on 3 pills 3 times daily for at least a month.  He says ever since going to 3 pills 3 times daily he has occasional mild acid reflux which his wife also test.  He feels a heartburn.  It is mild and tolerable.  He does take omeprazole but he is taking it with food as opposed to empty stomach.  There is no nausea.  No vomiting no diarrhea.  He feels his weight is stable.  Overall he is pleased with his Esbriet tolerance.  His chronic symptoms of shortness of breath and cough are stable although the cough is the most significant of symptoms.  He rates it as a 3 out of 5.  It is worse early in the morning than the rest of the day.  There is clear sputum.  Last visit I prescribed Robitussin which is helping him.  I also suggested he take Tessalon but he he is unable to afford a $66 co-pay.  We discussed gabapentin but this makes him sleepy from a previous neuropathic experience.  We discussed prednisone but this makes him hyperglycemic he does not want to do it.  We discussed opioids but he does not want to do that either.  He is on chronic Afrin nasal spray.  We discussed taking saline nasal spray or nasal steroids for his sinus drainage but he feels those things do not help.  Nevertheless he is willing to try it.  He is going to have a liver function test today  We discussed participation in clinical trials and he is interested in the future. y    No results found.    PFT  No flowsheet data found.    OV 05/30/2021  Subjective:  [name], male , DOB: 27-Mar-1945 , age 1 y.o. , MRN: [id number] , AD[address]ESS: [address] [place] [address] PCP [facility] Patient Care Team: [facility] as PCP - General Rennis Golden Lisette Abu, MD as PCP -  Cardiology (Cardiology)  This Provider for this visit: Treatment Team:  Attending Provider: Kalman Shan, MD    05/30/2021 -   Chief Complaint  Patient presents with   Follow-up    PFT performed today.  Pt states that he began breaking out on his face about 2 weeks ago. States his breathing has been about the same.     [name] 77 y.o. -returns for follow-up.  He is here with his wife.  He is a little bit upset because of running 30 minutes behind.  Apologize for this delay.  He tells me respiratory wise he stable.  He had pulmonary function test that looks for fairly robust.  However in the last 3 weeks he has had rash in his face.  This whole face is red.  He also some increased erythema in his bilateral forearms.  These are sun exposed areas.  He does admit to applying sunscreen but he is on pirfenidone at the same time.  2 weeks ago he ended up in the emergency department on 05/18/2021 and was given valacyclovir for blistering in the lips but this has not helped.  He is applying Chapstick.  He is frustrated by this.  Also simultaneously since starting pirfenidone he has had altered taste with food.   We did discuss potential steroids for his rash but he wants to follow with his primary dermatologist through the [place].  He is also worried about steroids because of its negative effect with his blood sugars.     OV 08/23/2021  Subjective:  [name], male , DOB: 1945-10-02 , age 7 y.o. , MRN: [id number] , AD[address]ESS: [address] [address] PCP [facility] Patient Care Team: [facility] as PCP - General Rennis Golden Lisette Abu, MD as PCP - Cardiology (Cardiology)  This Provider for this visit: Treatment Team:  Attending Provider: Kalman Shan, MD    08/23/2021 -   Chief Complaint  Patient presents with   Follow-up    Follow-up   [name] 77 y.o. -returns for follow-up.  He is no longer on pirfenidone after  stopping pirfenidone in April 2023 his dysgeusia resolved.  Rash also is resolved itching also resolved.  He did see nurse practitioner in May 2023 and by this time his rash was resolving.  Currently is not on any antifibrotic.  Overall he feels stable.  He is aware that nintedanib the other antifibrotic was considered second line for him because of the history of diverticulitis.  He tells me that has not had diverticulitis flareup in a long time.  He does not have any heart disease issues except he has pacemaker.  He has never had a heart attack.  He is aware now that nintedanib is very rarely associated with slight increase in MI risk.  Recently the mayor of [place], [place] who is the longest serving mayor in any city [place] passed away from pulmonary fibrosis.  This was a public news.  He says he is feeling sad because of that.  Wife states "it hit him hard".  Therefore he is more inclined to try nintedanib.  He wants to get this through the [facility].  In terms of symptoms he still has his cough.  He clears his throat a lot.  Albuterol helps.  We talked about over-the-counter Mucinex he is open to this idea.  Also of note: The last few weeks his right knee osteoarthritis is  flared up.  Because of this he did not do walk test.    OV 11/28/2021  Subjective:  [name], male , DOB: 1945-08-17 , age 73 y.o. , MRN: [id number] , AD[address]ESS: [address] [place] [address] PCP [facility] Patient Care Team: [facility] as PCP - General Hilty, Lisette Abu, MD as PCP - Cardiology (Cardiology)  This Provider for this visit: Treatment Team:  Attending Provider: Kalman Shan, MD     11/28/2021 -   Chief Complaint  Patient presents with   Office Visit    Had PFT today PT states no changes since last visit     [name] 77 y.o. -for follow-up.  Presents with his wife.  He is currently on a second bottle of Ofev 100 mg twice  daily.  He is tolerating it well without any GI symptoms.  His symptoms are stable.  His pulmonary function test is stable except for very mild reduction in DLCO which could be technical variation.  He has no change in his baseline symptoms other than the fact he has got cough that is still present it is mild to moderate in severity and annoying.  Review of the records indicate that his mom had asthma and earlier in the year he did feel better with the chest tightness and some cough with Breo.  Review of the labs indicate that he is all very had elevated blood eosinophils 5 cells per cubic millimeter in 2023.  He has never had allergy testing but he does report seasonal variation with a cough.      OV 03/06/2022  Subjective:  [name], male , DOB: 13-Sep-1945 , age 62 y.o. , MRN: [id number] , ADDRESS: [address] [place] [address] PCP [facility] Patient Care Team: [facility] as PCP - General Hilty, Lisette Abu, MD as PCP - Cardiology (Cardiology)  This Provider for this visit: Treatment Team:  Attending Provider: Kalman Shan, MD    03/06/2022 -   Chief Complaint  Patient presents with   Follow-up    Pft review    Diagnois is based on age > 18, ex smoker, progressive disease, probable UIP pattern on CT., negative serolgy and ILD qquewtionaire and acid reflux  -Start pirfenidone around Christmas 2022 - stopped 05/30/21 due to rash/taste  -Started low-dose nintedanib 100 mg twice daily September 2023  -Clinical research as a care option discussion done February 2023 in February 2024: Not interested.  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 -returns for follow-up.  Presents with his wife.  Overall he feels he is doing stable.  [facility].  He is on Advair for the last few to several months.  He is overall feeling stable but he feels the cough is the most bothersome problem although the cough itself is stable it is quite bothersome.  It is mostly in the day even at rest.  The wife describes it as bad and she is an independent historian today.  It does not bother his sleep except occasionally.  There are no new problems.  He  is saying there is a lot of sinus drainage.  Last visit we did a RAST allergy panel and blood eosinophils both elevated and abnormal.  No emergency room visits no urgent care visits no hospitalizations.  The results of the RAST allergy panel were reviewed.  In terms of his IPF symptom score is below.  He had pulmonary function test.  Compared to 1 year ago there is a slight decline in FVC and DLCO although quite acceptable.  He is tolerating his low-dose nintedanib protocol 100 mg twice daily fine     OV 07/02/2022  Subjective:  [name], male , DOB: [birth year] , age 77 y.o. , MRN: [id number] , ADDRESS: [address] [place] [address] PCP [facility] Patient Care Team: [facility] as PCP - General Rennis Golden Lisette Abu, MD as PCP - Cardiology (Cardiology)  This Provider for this visit: Treatment Team:  Attending Provider: Kalman Shan, MD    07/02/2022 -  No chief complaint on file.    [name] 77 y.o. -     SYMPTOM SCALE - ILD 12/20/2020 05/30/2021 Esbriet - rash 08/23/2021 Not on any antifibroic 11/28/2021 Ofev 100mg  bid 03/06/2022 Ofev 100mg  bid  Current weight       O2 use ra ra ra ra ra  Shortness of Breath 0 -> 5 scale with 5 being worst (score 6 If unable to do)      At rest 2 1 2 1 2   Simple tasks - showers, clothes change, eating, shaving 3 3 3 2 2   Household (dishes, doing bed, laundry) 4 3 3 1 2    Shopping 3 3 2 2 2   Walking level at own pace 3 4 3 3 3   Walking up Stairs Does not do 4 3 3 3   Total (30-36) Dyspnea Score 15 18 16 13 14   How bad is your cough? 4 4 3  - worse at night 3 4  How bad is your fatigue 4 5 3 4 4   How bad is nausea 0 0 0 0 0  How bad is vomiting?  0 0 0 0 0  How bad is diarrhea? 1 0 0 0 0  How bad is anxiety? 5 4 0 0 0  How bad is depression 0 1 0 0 0  Any chronic pain - if so where and how bad 0  Rt knoee 0 0  0    Simple office walk 185 feet x  3 laps goal with forehead probe 12/20/2020  03/19/2021  11/28/2021   O2 used ra ra   Number laps completed 3 3   Comments about pace 3    Resting Pulse Ox/HR 99% and 78/min 100 and 79   Final Pulse Ox/HR 97% and 96/min 100% and 90   Desaturated </= 88% no no   Desaturated <= 3% points no no   Got Tachycardic >/= 90/min yes yes   Symptoms at end of test Mild dyspnea Mild dyspnea   Miscellaneous comments x x    PFT     Latest Ref Rng & Units 06/24/2022    3:45 PM 03/06/2022    8:40 AM 11/28/2021    9:48 AM 05/30/2021    1:11 PM  PFT Results  FVC-Pre L  4.15  4.27  4.32   FVC-Predicted Pre % 106  104  107  107   FVC-Post L 4.15      FVC-Predicted Post % 104  Pre FEV1/FVC % % 81  81  81  83   Post FEV1/FCV % % 84      FEV1-Pre L 3.45  3.35  3.48  3.57   FEV1-Predicted Pre % 120  117  121  123   FEV1-Post L 3.47      DLCO uncorrected ml/min/mmHg 19.22  18.84  18.43  20.11   DLCO UNC% % 79  78  76  83   DLCO corrected ml/min/mmHg 19.22  18.84  18.43  20.11   DLCO COR %Predicted % 79  78  76  83   DLVA Predicted % 82  83  75  84   TLC L 7.33      TLC % Predicted % 107      RV % Predicted % 125           has a past medical history of Diabetes mellitus without complication (HCC) and High cholesterol.   reports that he quit smoking about 24 years ago. His smoking use included cigarettes. He has never used smokeless tobacco.  Past Surgical History:  Procedure Laterality Date   BACK  SURGERY     LEFT HEART CATH AND CORONARY ANGIOGRAPHY N/A 11/22/2019   Procedure: LEFT HEART CATH AND CORONARY ANGIOGRAPHY;  Surgeon: Corky Crafts, MD;  Location: [facility] INVASIVE CV LAB;  Service: Cardiovascular;  Laterality: N/A;   NECK SURGERY     PACEMAKER IMPLANT N/A 12/26/2019   Procedure: PACEMAKER IMPLANT;  Surgeon: Marinus Maw, MD;  Location: [facility] INVASIVE CV LAB;  Service: Cardiovascular;  Laterality: N/A;    Allergies  Allergen Reactions   Doxycycline Shortness Of Breath and Rash   Lisinopril Cough   Amoxicillin Rash   Lasix [Furosemide] Rash   Pirfenidone Rash   Prednisone     High blood sugar   Sulfa Antibiotics Rash    Headache/ Chest tightness/ stiff neck    Immunization History  Administered Date(s) Administered   Covid-19, Mrna,Vaccine(Spikevax)27yrs and older 03/19/2022   Fluad Quad(high Dose 65+) 11/18/2021   Influenza Split 11/22/2012   Influenza, High Dose Seasonal PF 01/09/2020, 11/15/2020   Moderna Covid-19 Vaccine Bivalent Booster 46yrs & up 12/25/2020   Moderna Sars-Covid-2 Vaccination 04/11/2019, 05/09/2019, 01/17/2020, 06/26/2020   Pneumococcal Polysaccharide-23 09/24/2020    Family History  Problem Relation Age of Onset   CAD Brother    CAD Brother      Current Outpatient Medications:    albuterol (VENTOLIN HFA) 108 (90 Base) MCG/ACT inhaler, Inhale 2 puffs into the lungs every 6 (six) hours as needed for wheezing or shortness of breath., Disp: 8 g, Rfl: 5   aspirin 81 MG EC tablet, Take 81 mg by mouth daily. , Disp: , Rfl:    Benralizumab (FASENRA PEN) 30 MG/ML SOAJ, Inject 1 mL (30 mg total) into the skin every 8 (eight) weeks. MAINTENANCE DOSE, Disp: 1 mL, Rfl: 1   Benralizumab (FASENRA PEN) 30 MG/ML SOAJ, Inject 30mg  into the skin at Week 0, Week 4, Week 8., Disp: 1 mL, Rfl: 2   Cholecalciferol (VITAMIN D3) 25 MCG (1000 UT) CAPS, Take 1,000 Units by mouth daily., Disp: , Rfl:    cyanocobalamin 1000 MCG tablet, Take 1,000 mcg by mouth  daily., Disp: , Rfl:    empagliflozin (JARDIANCE) 10 MG TABS tablet, Take 5 mg by mouth in the morning and at bedtime., Disp: , Rfl:    ferrous sulfate 325 (65 FE) MG tablet, Take 325 mg by mouth daily with breakfast., Disp: ,  Rfl:    fluticasone-salmeterol (WIXELA INHUB) 250-50 MCG/ACT AEPB, Inhale 1 puff into the lungs in the morning and at bedtime., Disp: 60 each, Rfl: 11   glipiZIDE (GLUCOTROL) 10 MG tablet, Take 5 mg by mouth 2 (two) times daily before a meal., Disp: , Rfl:    losartan (COZAAR) 25 MG tablet, TAKE 1 TABLET (25 MG TOTAL) BY MOUTH DAILY., Disp: 90 tablet, Rfl: 3   magnesium oxide (MAG-OX) 400 MG tablet, Take 400 mg by mouth 2 (two) times daily., Disp: , Rfl:    meclizine (ANTIVERT) 25 MG tablet, Take 25 mg by mouth 3 (three) times daily as needed for dizziness., Disp: , Rfl:    metFORMIN (GLUCOPHAGE) 1000 MG tablet, Take 1 tablet (1,000 mg total) by mouth in the morning and at bedtime., Disp: , Rfl:    Multiple Vitamin (MULTI-VITAMIN) tablet, Take 1 tablet by mouth daily., Disp: , Rfl:    omeprazole (PRILOSEC) 20 MG capsule, Take 20 mg by mouth See admin instructions. Every other night, Disp: , Rfl:    rosuvastatin (CRESTOR) 40 MG tablet, Take 40 mg by mouth at bedtime., Disp: , Rfl:       Objective:   There were no vitals filed for this visit.  Estimated body mass index is 26.43 kg/m as calculated from the following:   Height as of 03/06/22: 5\' 9"  (1.753 m).   Weight as of 03/06/22: 179 lb (81.2 kg).  @WEIGHTCHANGE @  There were no vitals filed for this visit.   Physical Exam   General: No distress. *** O2 at rest: *** Cane present: *** Sitting in wheel chair: *** Frail: *** Obese: *** Neuro: Alert and Oriented x 3. GCS 15. Speech normal Psych: Pleasant Resp:  Barrel Chest - ***.  Wheeze - ***, Crackles - ***, No overt respiratory distress CVS: Normal heart sounds. Murmurs - *** Ext: Stigmata of Connective Tissue Disease - *** HEENT: Normal upper airway. PEERL  +. No post nasal drip        Assessment:     No diagnosis found.     Plan:     There are no Patient Instructions on file for this visit.    SIGNATURE    Dr. Kalman Shan, M.D., F.C.C.P,  Pulmonary and Critical Care Medicine Staff Physician, [facility] Director - Interstitial Lung Disease  Program  Pulmonary Fibrosis [facility] [place], [place], [address]  Pager: [phone], If no answer or between  15:00h - 7:00h: call [phone] Telephone: [phone]  4:26 PM 07/02/2022

## 2022-07-03 ENCOUNTER — Ambulatory Visit (INDEPENDENT_AMBULATORY_CARE_PROVIDER_SITE_OTHER): Payer: No Typology Code available for payment source | Admitting: Internal Medicine

## 2022-07-03 ENCOUNTER — Encounter: Payer: Self-pay | Admitting: Internal Medicine

## 2022-07-03 VITALS — BP 110/60 | HR 68 | Ht 69.0 in | Wt 182.0 lb

## 2022-07-03 DIAGNOSIS — R053 Chronic cough: Secondary | ICD-10-CM

## 2022-07-03 DIAGNOSIS — J84112 Idiopathic pulmonary fibrosis: Secondary | ICD-10-CM

## 2022-07-03 DIAGNOSIS — D721 Eosinophilia, unspecified: Secondary | ICD-10-CM | POA: Diagnosis not present

## 2022-07-03 DIAGNOSIS — Z5181 Encounter for therapeutic drug level monitoring: Secondary | ICD-10-CM

## 2022-07-03 DIAGNOSIS — K219 Gastro-esophageal reflux disease without esophagitis: Secondary | ICD-10-CM

## 2022-07-03 DIAGNOSIS — J302 Other seasonal allergic rhinitis: Secondary | ICD-10-CM

## 2022-07-03 MED ORDER — ALBUTEROL SULFATE HFA 108 (90 BASE) MCG/ACT IN AERS: 2.0000 | INHALATION_SPRAY | Freq: Four times a day (QID) | RESPIRATORY_TRACT | 6 refills | Status: DC | PRN

## 2022-07-03 NOTE — Addendum Note (Signed)
Addended by: Hedda Slade on: 07/03/2022 02:13 PM   Modules accepted: Orders

## 2022-07-04 NOTE — Telephone Encounter (Signed)
xxx

## 2022-07-10 ENCOUNTER — Telehealth: Payer: Self-pay | Admitting: Pharmacist

## 2022-07-10 DIAGNOSIS — J84112 Idiopathic pulmonary fibrosis: Secondary | ICD-10-CM

## 2022-07-10 MED ORDER — OFEV 100 MG PO CAPS
100.0000 mg | ORAL_CAPSULE | Freq: Two times a day (BID) | ORAL | 1 refills | Status: DC
Start: 2022-07-10 — End: 2023-08-12

## 2022-07-10 NOTE — Telephone Encounter (Signed)
Rx for Ofev sent to [facility]. Clinicals sent via fax  Fax: [phone] Phone: [phone]  Chesley Mires, PharmD, MPH, BCPS, CPP Clinical Pharmacist (Rheumatology and Pulmonology)

## 2022-07-31 ENCOUNTER — Other Ambulatory Visit (INDEPENDENT_AMBULATORY_CARE_PROVIDER_SITE_OTHER): Payer: No Typology Code available for payment source

## 2022-07-31 DIAGNOSIS — J84112 Idiopathic pulmonary fibrosis: Secondary | ICD-10-CM

## 2022-07-31 LAB — HEPATIC FUNCTION PANEL
ALT: 30 U/L (ref 0–53)
AST: 25 U/L (ref 0–37)
Albumin: 4.4 g/dL (ref 3.5–5.2)
Alkaline Phosphatase: 48 U/L (ref 39–117)
Bilirubin, Direct: 0.1 mg/dL (ref 0.0–0.3)
Total Bilirubin: 0.5 mg/dL (ref 0.2–1.2)
Total Protein: 6.9 g/dL (ref 6.0–8.3)

## 2022-08-01 ENCOUNTER — Ambulatory Visit: Payer: No Typology Code available for payment source

## 2022-08-01 ENCOUNTER — Ambulatory Visit (INDEPENDENT_AMBULATORY_CARE_PROVIDER_SITE_OTHER): Payer: Medicare HMO

## 2022-08-01 DIAGNOSIS — I442 Atrioventricular block, complete: Secondary | ICD-10-CM | POA: Diagnosis not present

## 2022-08-06 LAB — CUP PACEART REMOTE DEVICE CHECK
Battery Remaining Longevity: 126 mo
Battery Remaining Percentage: 100 %
Brady Statistic RA Percent Paced: 4 %
Brady Statistic RV Percent Paced: 100 %
Date Time Interrogation Session: 20240628034100
Implantable Lead Connection Status: 753985
Implantable Lead Connection Status: 753985
Implantable Lead Implant Date: 20211122
Implantable Lead Implant Date: 20211122
Implantable Lead Location: 753859
Implantable Lead Location: 753860
Implantable Lead Model: 7841
Implantable Lead Model: 7842
Implantable Lead Serial Number: 1057413
Implantable Lead Serial Number: 1098402
Implantable Pulse Generator Implant Date: 20211122
Lead Channel Impedance Value: 568 Ohm
Lead Channel Impedance Value: 579 Ohm
Lead Channel Pacing Threshold Amplitude: 0.6 V
Lead Channel Pacing Threshold Amplitude: 1.2 V
Lead Channel Pacing Threshold Pulse Width: 0.4 ms
Lead Channel Pacing Threshold Pulse Width: 0.4 ms
Lead Channel Setting Pacing Amplitude: 2 V
Lead Channel Setting Pacing Amplitude: 2.5 V
Lead Channel Setting Pacing Pulse Width: 0.4 ms
Lead Channel Setting Sensing Sensitivity: 2.5 mV
Pulse Gen Serial Number: 962270
Zone Setting Status: 755011

## 2022-08-14 NOTE — Progress Notes (Signed)
Remote pacemaker transmission.   

## 2022-08-21 ENCOUNTER — Ambulatory Visit: Payer: Medicare HMO | Attending: Student | Admitting: Pulmonary Disease

## 2022-08-21 ENCOUNTER — Encounter: Payer: Self-pay | Admitting: Pulmonary Disease

## 2022-08-21 VITALS — BP 108/50 | HR 71 | Ht 69.5 in | Wt 181.0 lb

## 2022-08-21 DIAGNOSIS — R001 Bradycardia, unspecified: Secondary | ICD-10-CM

## 2022-08-21 DIAGNOSIS — I442 Atrioventricular block, complete: Secondary | ICD-10-CM

## 2022-08-21 DIAGNOSIS — I10 Essential (primary) hypertension: Secondary | ICD-10-CM

## 2022-08-21 LAB — CUP PACEART INCLINIC DEVICE CHECK
Date Time Interrogation Session: 20240718120429
Implantable Lead Connection Status: 753985
Implantable Lead Connection Status: 753985
Implantable Lead Implant Date: 20211122
Implantable Lead Implant Date: 20211122
Implantable Lead Location: 753859
Implantable Lead Location: 753860
Implantable Lead Model: 7841
Implantable Lead Model: 7842
Implantable Lead Serial Number: 1057413
Implantable Lead Serial Number: 1098402
Implantable Pulse Generator Implant Date: 20211122
Lead Channel Impedance Value: 597 Ohm
Lead Channel Impedance Value: 649 Ohm
Lead Channel Pacing Threshold Amplitude: 0.6 V
Lead Channel Pacing Threshold Amplitude: 0.7 V
Lead Channel Pacing Threshold Pulse Width: 0.4 ms
Lead Channel Pacing Threshold Pulse Width: 0.4 ms
Lead Channel Sensing Intrinsic Amplitude: 17.8 mV
Lead Channel Sensing Intrinsic Amplitude: 5.1 mV
Lead Channel Setting Pacing Amplitude: 2 V
Lead Channel Setting Pacing Amplitude: 2.5 V
Lead Channel Setting Pacing Pulse Width: 0.4 ms
Lead Channel Setting Sensing Sensitivity: 2.5 mV
Pulse Gen Serial Number: 962270
Zone Setting Status: 755011

## 2022-08-21 NOTE — Progress Notes (Signed)
  Electrophysiology Office Note:   Date:  08/21/2022  ID:  Scott Price, DOB 03/22/1945, MRN [id number]  Primary Cardiologist: Chrystie Nose, MD Electrophysiologist: Lewayne Bunting, MD      History of Present Illness:   Scott Price is a 77 y.o. male with h/o DM, ILD with UIP pattern, GERD,  HTN, [place] attacks, CHB s/p BSi PPM, AF seen today for routine electrophysiology followup.   The patient was last seen in EP Clinic in 05/2019 & his device interrogation was stable. His last remote was consistent with normal device function, appropriate histograms, leads / battery stable, 1 NSVT, 15 beat duration.   Since last being seen in our clinic the patient reports doing well other than his pulmonary fibrosis - notes consistent dry cough and intolerance to his anti-fibrotics due to GI sx.  To see Dr. Marchelle Gearing next week. He remains active and continues to work part time. Notes his pacer "hits his shoulder with activity & occasionally feels irritated".   He denies chest pain, palpitations, dyspnea, PND, orthopnea, nausea, vomiting, dizziness, syncope, edema, weight gain, or early satiety.   Review of systems complete and found to be negative unless listed in HPI.    EP Information / Studies Reviewed:    EKG is ordered today. Personal review as below.  EKG Interpretation Date/Time:  Thursday August 21 2022 11:15:07 EDT Ventricular Rate:  71 PR Interval:  238 QRS Duration:  154 QT Interval:  458 QTC Calculation: 497 R Axis:   -66  Text Interpretation: Atrial-sensed ventricular-paced rhythm with prolonged AV conduction When compared with ECG of 27-Dec-2019 05:47, Electronic ventricular pacemaker has replaced Sinus rhythm Confirmed by Canary Brim ([id number]) on 08/21/2022 11:58:02 AM   PPM Interrogation-  reviewed in detail today,  See PACEART report.  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 Wife accompanies him.  NECK: No JVD; No carotid bruits CARDIAC: Regular rate and rhythm, no murmurs, rubs, gallops RESPIRATORY:  normal effort, velcro inspiratory crackles posterior L>R ABDOMEN: Soft, non-tender, non-distended EXTREMITIES:  No edema; No deformity   ASSESSMENT AND PLAN:    CHB s/p Environmental manager PPM  -Normal PPM function -See Pace Art report -No device changes today  NSVT -noted on device interrogation, asymptomatic, normal LVEF  -consider BB if becomes asymptomatic    Disposition:   Follow up with EP APP in 12 months  Signed, Canary Brim, MSN, APRN, NP-C, AGACNP-BC [facility] - Electrophysiology  08/21/2022, 11:58 AM

## 2022-08-21 NOTE — Patient Instructions (Signed)
Medication Instructions:  Your physician recommends that you continue on your current medications as directed. Please refer to the Current Medication list given to you today.  *If you need a refill on your cardiac medications before your next appointment, please call your pharmacy*  Lab Work: None ordered If you have labs (blood work) drawn today and your tests are completely normal, you will receive your results only by: MyChart Message (if you have MyChart) OR A paper copy in the mail If you have any lab test that is abnormal or we need to change your treatment, we will call you to review the results.  Follow-Up: At [facility], you and your health needs are our priority.  As part of our continuing mission to provide you with exceptional heart care, we have created designated Provider Care Teams.  These Care Teams include your primary Cardiologist (physician) and Advanced Practice Providers (APPs -  Physician Assistants and Nurse Practitioners) who all work together to provide you with the care you need, when you need it.   Your next appointment:   1 year(s)  Provider:   You may see Lewayne Bunting, MD or one of the following Advanced Practice Providers on your designated Care Team:   Francis Dowse, [place] [address] [place], [place] Sherie Don, NP Canary Brim, NP

## 2022-08-28 ENCOUNTER — Encounter: Payer: Self-pay | Admitting: Internal Medicine

## 2022-08-28 ENCOUNTER — Ambulatory Visit (INDEPENDENT_AMBULATORY_CARE_PROVIDER_SITE_OTHER): Payer: Medicare HMO | Admitting: Internal Medicine

## 2022-08-28 ENCOUNTER — Ambulatory Visit: Payer: No Typology Code available for payment source | Admitting: Internal Medicine

## 2022-08-28 VITALS — BP 120/58 | HR 70 | Ht 69.5 in | Wt 185.8 lb

## 2022-08-28 DIAGNOSIS — K219 Gastro-esophageal reflux disease without esophagitis: Secondary | ICD-10-CM | POA: Diagnosis not present

## 2022-08-28 DIAGNOSIS — J029 Acute pharyngitis, unspecified: Secondary | ICD-10-CM

## 2022-08-28 DIAGNOSIS — R053 Chronic cough: Secondary | ICD-10-CM | POA: Diagnosis not present

## 2022-08-28 DIAGNOSIS — J84112 Idiopathic pulmonary fibrosis: Secondary | ICD-10-CM | POA: Diagnosis not present

## 2022-08-28 MED ORDER — NYSTATIN 100000 UNIT/ML MT SUSP: 5.0000 mL | Freq: Four times a day (QID) | OROMUCOSAL | 1 refills | Status: DC

## 2022-08-28 NOTE — Progress Notes (Signed)
IOV august 2022 for SOB by [facility]  Subjective:   [name] GENDER: male DOB: 1945-09-22, MRN: [id number]  Chief Complaint  Patient presents with   Consult    Pt is being referred due to chronic cough.  Pt states that he does have complaints of chest tightness and SOB with activities.      PMH DMII, high cholesterol. He has recurrent issues with chest tightness. Dry cough, usually non-productive. Occasionally in the morning her has productive cough. He has gerd and takes a PPI. If he does any activity it makes the episodes worse. Hot shower and activity makes it worse. No seasonal changes that he notices. He does have sinus issues at times. Retired at a Programme researcher, broadcasting/film/video. Was in [employer], [place] 1966 - 1971. No pets in the house. PFTS at [facility] ratio normal, FEV1 2L, normal dlco, normal tlc. He stays active and still working part time. He has used albuterol in the past prior to getting his pacemaker.   CT scan of the chest was completed in November 2021 which revealed lower lobe honeycombing and early evidence of interstitial lung disease. xxxxxx  OV 11/23/2020: here to day for follow up. Unfortunately he did not have his CT Chest complete. It was set up through the [place] and he was not prepared to pay for it. His cough is better. His chest tightness is better as well.  Has been using his inhalers.  OV 12/20/2020 -transfer of care to the ILD center to Dr. Marchelle Gearing.  Referred by Dr. Audie Box  Subjective:  [name], male , DOB: 1945/12/25 , age 77 y.o. , MRN: [id number] , ADDRESS: [address] [place] [address] PCP [facility] Patient Care Team: [facility] as PCP - General Rennis Golden Lisette Abu, MD as PCP - Cardiology (Cardiology)  This Provider for this visit: Treatment Team:  Attending Provider: Kalman Shan, MD    12/20/2020 -   Chief Complaint  Patient presents with   Follow-up    Chest  tightness     [name] 77 y.o. -seen in August 2022 in October 2022 by Dr. Tonia Brooms.  He is here with his wife.  He is a Cytogeneticist. Dr Shelle Iron at the [facility] in the area is retired.  Therefore he is seeing [place].  He reports insidious onset of shortness of breath at least a year.  This started around the time he had his pacemaker.  The pacemaker did get rid of his syncope but not the shortness of breath.  Is present on exertion relieved by rest.  Recently Dr. Tonia Brooms started [facility] and this is helped his chest tightness on exertion but he still continues have significant shortness of breath with exertion and cough.  Documented below.  He is aware that he has pulmonary fibrosis.  His symptoms are somewhat progressive.  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  Shared with the family his results.  Review of the records indicate that he has not had serology so far.  An ILD questionnaire packet was mailed out to him but he has not received it yet so we do not have the data.  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  His other medications include Breo and omeprazole.  He has a history of diverticulitis.  No coronary artery disease but he has a pacemaker.  He is not on anticoagulation he  is on aspirin only.  His walking desaturation test shows adequate oxygenation with minimal pulse ox drop.   [facility] Comprehensive ILD Questionnaire -dictated on 03/19/2021  Symptoms:  x    Past Medical History :  -Denies any asthma or COPD or heart failure.  Denies collagen vascular disease..  Denies sleep apnea -Does have acid reflux -Does have diabetes.  Intolerant to prednisone.  Makes him very hyperglycemic -Intolerant to gabapentin -Never had COVID   ROS:  -Does have exertional fatigue -10 pound weight loss prior to the November 2022 consult -Dry eyes present -Does have occasional acid reflux which is fairly well controlled with  omeprazole  FAMILY HISTORY of LUNG DISEASE:  -Mom had asthma  PERSONAL EXPOSURE HISTORY:  -Smoke cigarettes stop smoking 23 years ago.  Never did vaping in the past smoked some amount of marijuana but not much.  No cocaine use no intravenous drug use  HOME  EXPOSURE and HOBBY DETAILS :  -[place] in the urban house [place] 1997.  The house was 78 years old.  No dampness no mold or mildew.  Does not use CPAP.  Extensive organic antigen exposure history is negative  OCCUPATIONAL HISTORY (122 questions) : -He is a veteran but no agent orange exposure.  Has done gardening.  Has done woodwork.  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  Has done a garage repair.  Has done furniture work.  Has done some car Cabin crew work.  Has done machine operating.  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  Intolerant to prednisone. -For postnasal drip has tried nasal spray saline and steroid nasal spray and it does not work  INVESTIGATIONS: -Below   CT Chest data - HRCT 11/27/20  Study Result  Narrative & Impression  CLINICAL DATA:  77 year old male with history of interstitial lung disease. Follow-up study.   EXAM: CT CHEST WITHOUT CONTRAST   TECHNIQUE: Multidetector CT imaging of the chest was performed following the standard protocol without intravenous contrast. High resolution imaging of the lungs, as well as inspiratory and expiratory imaging, was performed.   COMPARISON:  Chest CT 12/19/2019.   FINDINGS: Cardiovascular: Heart size is normal. There is no significant pericardial fluid, thickening or pericardial calcification. There is aortic atherosclerosis, as well as atherosclerosis of the great vessels of the mediastinum and the coronary arteries, including calcified atherosclerotic plaque in the left main, left anterior descending, left circumflex and right coronary  arteries. Severe calcifications of the aortic valve and mitral annulus. Left-sided pacemaker device in place with lead tips terminating in the right atrial appendage and right ventricular apex.   Mediastinum/Nodes: No pathologically enlarged mediastinal or hilar lymph nodes. Please note that accurate exclusion of hilar adenopathy is limited on noncontrast CT scans. Esophagus is unremarkable in appearance. No axillary lymphadenopathy.   Lungs/Pleura: High-resolution images demonstrate widespread but patchy areas of peripheral predominant ground-glass attenuation, septal thickening, subpleural reticulation, cylindrical bronchiectasis and peripheral bronchiolectasis. Findings appear progressive compared to the prior study. Findings have a definitive craniocaudal gradient. No frank honeycombing is confidently identified at this time. Inspiratory and expiratory imaging demonstrates some very mild air trapping in the lung bases indicative of mild small airways disease. No acute consolidative airspace disease. No pleural effusions. No suspicious appearing pulmonary nodules or masses are noted.   Upper Abdomen: Aortic atherosclerosis.   Musculoskeletal: Orthopedic fixation hardware in the lower cervical spine incidentally noted.  There are no aggressive appearing lytic or blastic lesions noted in the visualized portions of the skeleton.   IMPRESSION: 1. Progressively worsening interstitial lung disease, 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 2. Aortic atherosclerosis, in addition to left main and 3 vessel coronary artery disease. Assessment for potential risk factor modification, dietary therapy or pharmacologic therapy may be warranted, if clinically indicated. 3. There are calcifications of the aortic valve and mitral annulus. Echocardiographic correlation for evaluation of potential valvular dysfunction may be  warranted if clinically indicated.   Aortic Atherosclerosis (ICD10-I70.0).     Electronically Signed   By: Trudie Reed M.D.   On: 11/28/2020 12:46    No results found.  Cardoiac cath  oct 2021 Mid RCA lesion is 25% stenosed. Prox Cx to Mid Cx lesion is 25% stenosed. Prox LAD to Mid LAD lesion is 25% stenosed. The left ventricular systolic function is normal. LV end diastolic pressure is normal. The left ventricular ejection fraction is 55-65% by visual estimate. There is no aortic valve stenosis.   Nonobstructive CAD.  Continue preventive therapy  PFT  No flowsheet data found.   Latest Reference Range & Units 11/19/19 12:17  Creatinine 0.61 - 1.24 mg/dL 5.28    Latest Reference Range & Units 11/19/19 12:17  Hemoglobin 13.0 - 17.0 g/dL 41.3       OV 03/09/4008  Subjective:  [name], male , DOB: 12-07-1945 , age 21 y.o. , MRN: [id number] , ADDRESS: [address] [place] [address] PCP [facility] Patient Care Team: [facility] as PCP - General Rennis Golden Lisette Abu, MD as PCP - Cardiology (Cardiology)  This Provider for this visit: Treatment Team:  Attending Provider: Kalman Shan, MD  Type of visit:Video Circumstance: COVID-19 national emergency Identification of patient ARRIS MEYN with 22-Aug-1945 and MRN [id number] - 2 person identifier Risks: Risks, benefits, limitations of telephone visit explained. Patient understood and verbalized agreement to proceed Anyone else on call: his wife on side Patient location: [address] [place] [address] This provider location: [address]; [place]; [place] [address]. [facility]. [phone]    02/08/2021 - clinical IPF followup - to assess esbriet uptake    [name] 77 y.o. - only on 2nd week of esbreit. On 2 pills tid. Sunday 02/10/21 ends 2pills tid. On Monday 02/11/21 will go to 3 pills tid. No side effects. Spacing 5-6h apart .  Does not wear sunscreen bu wears hat and long sleeve clothes. Does have sunscren. Reviewd serology and cbc/chmeistry - these are normal  However, reports chest congestion. Hacks and coughs. Sputum +. Says it is baseline few months ago. Rates  it as 4-5 of 5.Does not know sputum color - but thinks it is clear. Not wheezing. Clears throat. fEels he says he cannot take prednisone - even 5 days - drives him hyperglucemic 400s  AUTOIMMUNE 12/20/20   Latest Reference Range & Units 11/19/19 12:17 12/14/19 09:41 04/27/20 15:59 12/20/20 15:13  Sed Rate 0 - 20 mm/hr    7  Glucose 70 - 99 mg/dL 160 (H) 109 (H) 323 (H) 166 (H)  Anti Nuclear Antibody (ANA) NEGATIVE     NEGATIVE  Angiotensin-Converting Enzyme 9 - 67 U/L    20  ds DNA Ab IU/mL    <1  RA Latex Turbid. <14 IU/mL    <14  SSA (Ro) (ENA)  Antibody, IgG <1.0 NEG AI    <1.0 NEG  SSB (La) (ENA) Antibody, IgG <1.0 NEG AI    <1.0 NEG  Scleroderma (Scl-70) (ENA) Antibody, IgG <1.0 NEG AI    <1.0 NEG  QUANTIFERON-TB GOLD PLUS     Rpt  (H): Data is abnormally high Rpt: View report in Results Review for more information   PFT  No flowsheet data found  OV 03/19/2021  Subjective:  [name], male , DOB: [birth year] , age 80 y.o. , MRN: [id number] , ADDRESS: [address] [place] [address] PCP [facility] Patient Care Team: [facility] as PCP - General Rennis Golden Lisette Abu, MD as PCP - Cardiology (Cardiology)  This Provider for this visit: Treatment Team:  Attending Provider: Kalman Shan, MD    03/19/2021 -   Chief Complaint  Patient presents with   Follow-up    Pt states he has been doing okay since last visit. States he does still become SOB with exertion and still has complaints of coughing that is worse in the mornings.   Diagnois is based on age > 68, ex smoker, progressive disease, probable UIP pattern on CT., negative serolgy and ILD qquewtionaire and acid reflux  -Start pirfenidone  around Christmas 2022 -Significant out of proportion cough  [name] 77 y.o. -returns for follow-up.  I reviewed his ILD questionnaire and his exposures consistent with IPF.  Currently he is on pirfenidone.  He is now on 3 pills 3 times daily for at least a month.  He says ever since going to 3 pills 3 times daily he has occasional mild acid reflux which his wife also test.  He feels a heartburn.  It is mild and tolerable.  He does take omeprazole but he is taking it with food as opposed to empty stomach.  There is no nausea.  No vomiting no diarrhea.  He feels his weight is stable.  Overall he is pleased with his Esbriet tolerance.  His chronic symptoms of shortness of breath and cough are stable although the cough is the most significant of symptoms.  He rates it as a 3 out of 5.  It is worse early in the morning than the rest of the day.  There is clear sputum.  Last visit I prescribed Robitussin which is helping him.  I also suggested he take Tessalon but he he is unable to afford a $66 co-pay.  We discussed gabapentin but this makes him sleepy from a previous neuropathic experience.  We discussed prednisone but this makes him hyperglycemic he does not want to do it.  We discussed opioids but he does not want to do that either.  He is on chronic Afrin nasal spray.  We discussed taking saline nasal spray or nasal steroids for his sinus drainage but he feels those things do not help.  Nevertheless he is willing to try it.  He is going to have a liver function test today  We discussed participation in clinical trials and he is interested in the future. y    No results found.    PFT  No flowsheet data found.    OV 05/30/2021  Subjective:  [name], male , DOB: [birth year] , age 74 y.o. , MRN: [id number] , ADDRESS: [address] [place] [address] PCP [facility] Patient Care Team: [facility] as PCP - General Rennis Golden Lisette Abu, MD as PCP -  Cardiology (  Cardiology)  This Provider for this visit: Treatment Team:  Attending Provider: Kalman Shan, MD    05/30/2021 -   Chief Complaint  Patient presents with   Follow-up    PFT performed today.  Pt states that he began breaking out on his face about 2 weeks ago. States his breathing has been about the same.     [name] 77 y.o. -returns for follow-up.  He is here with his wife.  He is a little bit upset because of running 30 minutes behind.  Apologize for this delay.  He tells me respiratory wise he stable.  He had pulmonary function test that looks for fairly robust.  However in the last 3 weeks he has had rash in his face.  This whole face is red.  He also some increased erythema in his bilateral forearms.  These are sun exposed areas.  He does admit to applying sunscreen but he is on pirfenidone at the same time.  2 weeks ago he ended up in the emergency department on 05/18/2021 and was given valacyclovir for blistering in the lips but this has not helped.  He is applying Chapstick.  He is frustrated by this.  Also simultaneously since starting pirfenidone he has had altered taste with food.   We did discuss potential steroids for his rash but he wants to follow with his primary dermatologist through the [place].  He is also worried about steroids because of its negative effect with his blood sugars.     OV 08/23/2021  Subjective:  [name], male , DOB: 1945/06/20 , age 77 y.o. , MRN: [id number] , AD[address]ESS: [address] [place] [address] PCP [facility] Patient Care Team: [facility] as PCP - General Rennis Golden Lisette Abu, MD as PCP - Cardiology (Cardiology)  This Provider for this visit: Treatment Team:  Attending Provider: Kalman Shan, MD    08/23/2021 -   Chief Complaint  Patient presents with   Follow-up    Follow-up   [name] 77 y.o. -returns for follow-up.  He is no longer on pirfenidone after  stopping pirfenidone in April 2023 his dysgeusia resolved.  Rash also is resolved itching also resolved.  He did see nurse practitioner in May 2023 and by this time his rash was resolving.  Currently is not on any antifibrotic.  Overall he feels stable.  He is aware that nintedanib the other antifibrotic was considered second line for him because of the history of diverticulitis.  He tells me that has not had diverticulitis flareup in a long time.  He does not have any heart disease issues except he has pacemaker.  He has never had a heart attack.  He is aware now that nintedanib is very rarely associated with slight increase in MI risk.  Recently the mayor of [place], [place] who is the longest serving mayor in any city [place] passed away from pulmonary fibrosis.  This was a public news.  He says he is feeling sad because of that.  Wife states "it hit him hard".  Therefore he is more inclined to try nintedanib.  He wants to get this through the [facility].  In terms of symptoms he still has his cough.  He clears his throat a lot.  Albuterol helps.  We talked about over-the-counter Mucinex he is open to this idea.  Also of note: The last few weeks his right knee osteoarthritis is flared  up.  Because of this he did not do walk test.    OV 11/28/2021  Subjective:  [name], male , DOB: February 26, 1945 , age 33 y.o. , MRN: [id number] , ADDRESS: [address] [place] [address] PCP [facility] Patient Care Team: [facility] as PCP - General Hilty, Lisette Abu, MD as PCP - Cardiology (Cardiology)  This Provider for this visit: Treatment Team:  Attending Provider: Kalman Shan, MD     11/28/2021 -   Chief Complaint  Patient presents with   Office Visit    Had PFT today PT states no changes since last visit     [name] 77 y.o. -for follow-up.  Presents with his wife.  He is currently on a second bottle of Ofev 100 mg twice  daily.  He is tolerating it well without any GI symptoms.  His symptoms are stable.  His pulmonary function test is stable except for very mild reduction in DLCO which could be technical variation.  He has no change in his baseline symptoms other than the fact he has got cough that is still present it is mild to moderate in severity and annoying.  Review of the records indicate that his mom had asthma and earlier in the year he did feel better with the chest tightness and some cough with Breo.  Review of the labs indicate that he is all very had elevated blood eosinophils 5 cells per cubic millimeter in 2023.  He has never had allergy testing but he does report seasonal variation with a cough.      OV 03/06/2022  Subjective:  [name], male , DOB: 01-Jun-1945 , age 68 y.o. , MRN: [id number] , ADDRESS: [address] [place] [address] PCP [facility] Patient Care Team: [facility] as PCP - General Rennis Golden Lisette Abu, MD as PCP - Cardiology (Cardiology)  This Provider for this visit: Treatment Team:  Attending Provider: Kalman Shan, MD    03/06/2022 -   Chief Complaint  Patient presents with   Follow-up    Pft review     [name] 77 y.o. -returns for follow-up.  Presents with his wife.  Overall he feels he is doing stable.  [facility]  He is on Advair for the last few to several months.  He is overall feeling stable but he feels the cough is the most bothersome problem although the cough itself is stable it is quite bothersome.  It is mostly in the day even at rest.  The wife describes it as bad and she is an independent historian today.  It does not bother his sleep except occasionally.  There are no new problems.  He is saying there is a lot of sinus drainage.  Last visit we did a RAST allergy panel and blood eosinophils both elevated and abnormal.  No emergency room visits no urgent care visits no  hospitalizations.  The results of the RAST allergy panel were reviewed.  In terms of his IPF symptom score is below.  He had pulmonary function test.  Compared to 1 year ago there is a slight decline in FVC and DLCO although quite acceptable.  He is tolerating his low-dose nintedanib protocol 100 mg twice daily fine     OV 07/03/2022  Subjective:  [name], male , DOB: 08-03-1945 , age 68 y.o. ,  MRN: [id number] , ADDRESS: [address] [place] [address] PCP [facility] Patient Care Team: [facility] as PCP - General Rennis Golden Lisette Abu, MD as PCP - Cardiology (Cardiology)  This Provider for this visit: Treatment Team:  Attending Provider: Kalman Shan, MD  07/03/2022 -   Chief Complaint  Patient presents with   Follow-up    F/up on PFT     [name] 77 y.o. -presents with his wife.  Last seen February 2024.  At the time he was tolerating nintedanib low-dose protocol.  Then he called in March 2024 because of severe diarrhea and he stopped nintedanib.  Since then the diarrhea has resolved.  His shortness of breath is stable.  His cough is also stable.  But it is bothersome.  The cough is more than the shortness of breath.  Wife level the cough is 2 out of 5 but he thought it was more 4 out of 5.  It is a dry cough occasionally there are some wheezing.  He has been started on Fasenra because of RAST allergy positivity and high blood eosinophils.  He is approaching 8 weeks in June 2024.  So far the cough is not improved.  Did indicate to him that we will have to try for a total of 6 months before figuring out if the Harrington Challenger will help his cough or not.  Meanwhile he is asking if he can go back on the nintedanib.  He is aware of diverticulitis risk.  He is also aware that and he feels that if the diarrhea comes back with nintedanib then he will stop nintedanib altogether.  He wants to rechallenge.  I supported him on this.  He had a pulmonary  function test today it is actually stable.  I do not think the decline from 1 year ago is significant.  He did a sit/stand exercise hypoxemia test.  He did get short of breath but his pulse ox did not drop at all.     OV 08/28/2022  Subjective:  [name], male , DOB: 07-05-1945 , age 51 y.o. , MRN: [id number] , ADDRESS: [address] [place] [address] PCP [facility] Patient Care Team: [facility] as PCP - General Hilty, Lisette Abu, MD as PCP - Cardiology (Cardiology) Marinus Maw, MD as PCP - Electrophysiology (Cardiology)  This Provider for this visit: Treatment Team:  Attending Provider: Kalman Shan, MD    08/28/2022 -   Chief Complaint  Patient presents with   Follow-up    F/up, no complaints    Diagnois is based on age > 64, ex smoker, progressive disease, probable UIP pattern on CT., negative serolgy and ILD qquewtionaire and acid reflux  -Start pirfenidone around Christmas 2022 - stopped 05/30/21 due to rash/taste  -Started low-dose nintedanib 100 mg twice daily September 2023 -?>  Stopped due to diarrhea March 2024  -Clinical research as a care option discussion done February 2023 in February 2024: Not interested.  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   Works at [employer]  [name] 77 y.o. -returns for follow-up..  Presents with  his wife who uses a walker.  She is an independent historian.  They both attest the same thing.  He tried nintedanib again and this time every time he tried he started vomiting so he stopped.  He only took it for a total of 3 pills for 3 days.   Shortness of breath is stable.  His sit/stand excess hypoxemia test today is also stable.  He did not have pulmonary function test.  He continues on Fasenra which was started for any allergic component to his chronic cough and he says it is somewhat helping.  He did not fill out his cough severely.  He was on Advair but he quit taking it because it was causing sore throat and also sensation of tongue and lips burning apparently is been going on for a year.  He did stop the Advair so he is going to see if this is getting better but after stopping the Advair his cough is definitely better.  For now we have opted to continue with Fasenra.  In the past he was not interested in clinical trials as a care option but at this point in time he is interested given the fact he does not want to challenge himself with pirfenidone or nintedanib ever again.  We talked about various clinical trials that involve pills [some GI side effect risk] versus injection subcutaneous not known to have GI side effects, nebulizer that has oral and chest side effects but that are local and reversible].  He is interested in any of these options but only after October 2024 when he retired from working at [employer].     SYMPTOM SCALE - ILD 12/20/2020 05/30/2021 Esbriet - rash 08/23/2021 Not on any antifibroic 11/28/2021 Ofev 100mg  bid 03/06/2022 Ofev 100mg  bid 07/03/2022 Ofev holdiay since [id number] due to diarrha. On fasenral x 8 weeks 08/28/2022 Off ofev, Failed 2nd challeng. On Fasenral since march 203  Current weight         O2 use ra ra ra ra ra ra ra  Shortness of Breath 0 -> 5 scale with 5 being worst (score 6 If unable to do)        At rest 2 1 2 1 2 3 2   Simple tasks - showers, clothes change, eating, shaving 3 3 3 2 2 1 2   Household (dishes, doing bed, laundry) 4 3 3 1 2  0 2  Shopping 3 3 2 2 2 1 3   Walking level at own pace 3 4 3 3 3 2 3   Walking up Stairs Does not do 4 3 3 3 1 3   Total (30-36) Dyspnea Score 15 18 16 13 14  8 15   How bad is your cough? 4 4 3  - worse at night 3 4 2  x  How bad is your fatigue 4 5 3 4 4 1 3   How bad is nausea 0 0 0 0 0 0 0  How bad is vomiting?  0 0 0 0 0 0 0  How bad is diarrhea? 1 0 0 0 0 0 1  How bad is anxiety? 5 4 0 0 0 0 4  How bad is depression 0 1 0 0 0 0 0  Any chronic pain - if so where and how bad 0  Rt knoee 0 0 0 x  0    Simple office walk 185 feet x  3 laps goal with forehead probe 12/20/2020  03/19/2021  07/03/2022  08/28/2022   O2 used ra  ra ra ra  Number laps completed 3 3 Sit stand x 10 Sit stand x 01  Comments about pace 3     Resting Pulse Ox/HR 99% and 78/min 100 and 79 98% and HR 72 97% and HR 66  Final Pulse Ox/HR 97% and 96/min 100% and 90 98% and HR  81 98% and HR 72  Desaturated </= 88% no no no   Desaturated <= 3% points no no no   Got Tachycardic >/= 90/min yes yes no   Symptoms at end of test Mild dyspnea Mild dyspnea Yes some  yes  Miscellaneous comments x x        PFT     Latest Ref Rng & Units 06/24/2022    3:45 PM 03/06/2022    8:40 AM 11/28/2021    9:48 AM 05/30/2021    1:11 PM  ILD indicators  FVC-Pre L  4.15  4.27  4.32   FVC-Predicted Pre % 106  104  107  107   FVC-Post L 4.15      FVC-Predicted Post % 104      TLC L 7.33      TLC Predicted % 107      DLCO uncorrected ml/min/mmHg 19.22  18.84  18.43  20.11   DLCO UNC %Pred % 79  78  76  83   DLCO Corrected ml/min/mmHg 19.22  18.84  18.43  20.11   DLCO COR %Pred % 79  78  76  83       LAB RESULTS last 96 hours No results found.  LAB RESULTS last 90 days Recent Results (from the past 2160 hour(s))  Pulmonary function test     Status: None   Collection Time: 06/24/22  3:45 PM  Result Value Ref Range   FVC-%Pred-Pre 106 %   FVC-Post 4.15 L   FVC-%Pred-Post 104 %   FVC-%Change-Post -2 %   FEV1-Pre 3.45 L   FEV1-%Pred-Pre 120 %   FEV1-Post 3.47 L   FEV1-%Pred-Post 121 %   FEV1-%Change-Post 0 %   FEV6-Pre 4.23 L   FEV6-%Pred-Pre 113 %   FEV6-Post 4.15 L    FEV6-%Pred-Post 111 %   FEV6-%Change-Post -1 %   Pre FEV1/FVC ratio 81 %   FEV1FVC-%Pred-Pre 112 %   Post FEV1/FVC ratio 84 %   FEV1FVC-%Change-Post 2 %   Pre FEV6/FVC Ratio 100 %   FEV6FVC-%Pred-Pre 106 %   Post FEV6/FVC ratio 100 %   FEV6FVC-%Pred-Post 106 %   FEV6FVC-%Change-Post 0 %   FEF 25-75 Pre 3.15 L/sec   FEF2575-%Pred-Pre 154 %   FEF 25-75 Post 4.15 L/sec   FEF2575-%Pred-Post 204 %   FEF2575-%Change-Post 32 %   RV 3.20 L   RV % pred 125 %   TLC 7.33 L   TLC % pred 107 %   DLCO unc 19.22 ml/min/mmHg   DLCO unc % pred 79 %   DLCO cor 19.22 ml/min/mmHg   DLCO cor % pred 79 %   DL/VA 8.65 ml/min/mmHg/L   DL/VA % pred 82 %  Hepatic function panel     Status: None   Collection Time: 07/31/22  9:53 AM  Result Value Ref Range   Total Bilirubin 0.5 0.2 - 1.2 mg/dL   Bilirubin, Direct 0.1 0.0 - 0.3 mg/dL   Alkaline Phosphatase 48 39 - 117 U/L   AST 25 0 - 37 U/L   ALT 30 0 - 53 U/L   Total Protein 6.9 6.0 - 8.3 g/dL  Albumin 4.4 3.5 - 5.2 g/dL  CUP PACEART REMOTE DEVICE CHECK     Status: None   Collection Time: 08/01/22  3:41 AM  Result Value Ref Range   Date Time Interrogation Session [id number]    Pulse Generator Manufacturer BOST    Pulse Gen Model L331 ACCOLADE MRI EL    Pulse Gen Serial Number [id number]    Clinic Name [facility]    Implantable Pulse Generator Type Implantable Pulse Generator    Implantable Pulse Generator Implant Date [id number]    Implantable Lead Manufacturer BOST    Implantable Lead Model 7841 Ingevity + MRI    Implantable Lead Serial Number [id number]    Implantable Lead Implant Date [id number]    Implantable Lead Location Detail 1 UNKNOWN    Implantable Lead Location [id number]    Implantable Lead Connection Status [id number]    Implantable Lead Manufacturer BOST    Implantable Lead Model 7842 Ingevity + MRI    Implantable Lead Serial Number [id number]    Implantable Lead Implant Date [id number]    Implantable Lead Location Detail 1 UNKNOWN     Implantable Lead Location [id number]    Implantable Lead Connection Status [id number]    Lead Channel Setting Sensing Sensitivity 2.5 mV   Lead Channel Setting Sensing Adaptation Mode Fixed Pacing    Lead Channel Setting Pacing Amplitude 2.0 V   Lead Channel Setting Pacing Pulse Width 0.4 ms   Lead Channel Setting Pacing Amplitude 2.5 V   Zone Setting Status [id number]    Lead Channel Impedance Value 579 ohm   Lead Channel Pacing Threshold Amplitude 0.6 V   Lead Channel Pacing Threshold Pulse Width 0.4 ms   Lead Channel Impedance Value 568 ohm   Lead Channel Pacing Threshold Amplitude 1.2 V   Lead Channel Pacing Threshold Pulse Width 0.4 ms   Battery Status BOS    Battery Remaining Longevity 126 mo   Battery Remaining Percentage 100 %   Brady Statistic RA Percent Paced 4 %   Brady Statistic RV Percent Paced 100 %  CUP PACEART INCLINIC DEVICE CHECK     Status: None   Collection Time: 08/21/22 12:04 PM  Result Value Ref Range   Date Time Interrogation Session [phone]    Pulse Generator Manufacturer BOST    Pulse Gen Model L331 ACCOLADE MRI EL    Pulse Gen Serial Number [id number]    Clinic Name [facility]    Implantable Pulse Generator Type Implantable Pulse Generator    Implantable Pulse Generator Implant Date [id number]    Implantable Lead Manufacturer BOST    Implantable Lead Model 7841 Ingevity + MRI    Implantable Lead Serial Number [id number]    Implantable Lead Implant Date [id number]    Implantable Lead Location Detail 1 UNKNOWN    Implantable Lead Location [id number]    Implantable Lead Connection Status [id number]    Implantable Lead Manufacturer BOST    Implantable Lead Model 7842 Ingevity + MRI    Implantable Lead Serial Number [id number]    Implantable Lead Implant Date [id number]    Implantable Lead Location Detail 1 UNKNOWN    Implantable Lead Location [id number]    Implantable Lead Connection Status [id number]    Lead Channel Setting Sensing Sensitivity 2.5 mV   Lead Channel Setting  Sensing Adaptation Mode Fixed Pacing    Lead Channel Setting Pacing Amplitude 2.0 V   Lead Channel Setting Pacing Pulse Width 0.4 ms   Lead Channel Setting Pacing Amplitude 2.5 V  Zone Setting Status [phone]    Lead Channel Impedance Value 649.0 ohm   Lead Channel Sensing Intrinsic Amplitude 5.1 mV   Lead Channel Pacing Threshold Amplitude 0.7000 V   Lead Channel Pacing Threshold Pulse Width 0.4 ms   Lead Channel Impedance Value 597.0 ohm   Lead Channel Sensing Intrinsic Amplitude 17.8 mV   Lead Channel Pacing Threshold Amplitude 0.6000 V   Lead Channel Pacing Threshold Pulse Width 0.4 ms   Battery Status BOS          has a past medical history of Diabetes mellitus without complication (HCC) and High cholesterol.   reports that he quit smoking about 24 years ago. His smoking use included cigarettes. He has never used smokeless tobacco.  Past Surgical History:  Procedure Laterality Date   BACK SURGERY     LEFT HEART CATH AND CORONARY ANGIOGRAPHY N/A 11/22/2019   Procedure: LEFT HEART CATH AND CORONARY ANGIOGRAPHY;  Surgeon: Corky Crafts, MD;  Location: [facility] INVASIVE CV LAB;  Service: Cardiovascular;  Laterality: N/A;   NECK SURGERY     PACEMAKER IMPLANT N/A 12/26/2019   Procedure: PACEMAKER IMPLANT;  Surgeon: Marinus Maw, MD;  Location: [facility] INVASIVE CV LAB;  Service: Cardiovascular;  Laterality: N/A;    Allergies  Allergen Reactions   Doxycycline Shortness Of Breath and Rash   Lisinopril Cough   Ofev [Nintedanib] Nausea And Vomiting   Amoxicillin Rash   Lasix [Furosemide] Rash   Pirfenidone Rash   Prednisone     High blood sugar   Sulfa Antibiotics Rash    Headache/ Chest tightness/ stiff neck    Immunization History  Administered Date(s) Administered   Covid-19, Mrna,Vaccine(Spikevax)60yrs and older 03/19/2022   Fluad Quad(high Dose 65+) 11/18/2021   Influenza Split 11/22/2012, 11/01/2015, 11/06/2016   Influenza, High Dose Seasonal PF 01/31/2011,  12/16/2013, 01/09/2020, 11/15/2020   Influenza-Unspecified 01/18/2007, 01/13/2008, 01/12/2009, 11/03/2009, 10/05/2011, 11/03/2012, 12/04/2014, 10/13/2017, 01/13/2019, 11/03/2020, 11/19/2021   Moderna Covid-19 Vaccine Bivalent Booster 40yrs & up 12/25/2020   Moderna Sars-Covid-2 Vaccination 04/11/2019, 05/09/2019, 01/17/2020, 06/26/2020   PNEUMOCOCCAL CONJUGATE-20 09/24/2020   Pneumococcal Conjugate-13 10/10/2014   Pneumococcal Polysaccharide-23 01/08/2012, 09/24/2020   Pneumococcal-Unspecified 09/13/2006, 01/08/2012   Rsv, Bivalent, Protein Subunit Rsvpref,pf Verdis Frederickson) 04/11/2022   Tdap 10/10/2014   Tetanus Immune Globulin 01/08/2012    Family History  Problem Relation Age of Onset   CAD Brother    CAD Brother      Current Outpatient Medications:    albuterol (VENTOLIN HFA) 108 (90 Base) MCG/ACT inhaler, Inhale 2 puffs into the lungs every 6 (six) hours as needed for wheezing or shortness of breath., Disp: 8 g, Rfl: 6   aspirin 81 MG EC tablet, Take 81 mg by mouth daily. , Disp: , Rfl:    Benralizumab (FASENRA PEN) 30 MG/ML SOAJ, Inject 1 mL (30 mg total) into the skin every 8 (eight) weeks. MAINTENANCE DOSE, Disp: 1 mL, Rfl: 1   Benralizumab (FASENRA PEN) 30 MG/ML SOAJ, Inject 30mg  into the skin at Week 0, Week 4, Week 8., Disp: 1 mL, Rfl: 2   Cholecalciferol (VITAMIN D3) 25 MCG (1000 UT) CAPS, Take 1,000 Units by mouth daily., Disp: , Rfl:    cyanocobalamin 1000 MCG tablet, Take 1,000 mcg by mouth daily., Disp: , Rfl:    empagliflozin (JARDIANCE) 10 MG TABS tablet, Take 5 mg by mouth in the morning and at bedtime., Disp: , Rfl:    ferrous sulfate 325 (65 FE) MG tablet, Take 325 mg by mouth daily with breakfast.,  Disp: , Rfl:    glipiZIDE (GLUCOTROL) 10 MG tablet, Take 5 mg by mouth 2 (two) times daily before a meal., Disp: , Rfl:    magnesium oxide (MAG-OX) 400 MG tablet, Take 400 mg by mouth 2 (two) times daily., Disp: , Rfl:    meclizine (ANTIVERT) 25 MG tablet, Take 25 mg by mouth  3 (three) times daily as needed for dizziness., Disp: , Rfl:    meloxicam (MOBIC) 15 MG tablet, Take by mouth daily., Disp: , Rfl:    metFORMIN (GLUCOPHAGE) 1000 MG tablet, Take 1 tablet (1,000 mg total) by mouth in the morning and at bedtime., Disp: , Rfl:    Multiple Vitamin (MULTI-VITAMIN) tablet, Take 1 tablet by mouth daily., Disp: , Rfl:    omeprazole (PRILOSEC) 20 MG capsule, Take 20 mg by mouth See admin instructions. Every other night, Disp: , Rfl:    rosuvastatin (CRESTOR) 40 MG tablet, Take 40 mg by mouth at bedtime., Disp: , Rfl:    traZODone (DESYREL) 50 MG tablet, Take by mouth as needed for sleep., Disp: , Rfl:    fluticasone-salmeterol (WIXELA INHUB) 250-50 MCG/ACT AEPB, Inhale 1 puff into the lungs in the morning and at bedtime. (Patient not taking: Reported on 08/28/2022), Disp: 60 each, Rfl: 11   losartan (COZAAR) 25 MG tablet, TAKE 1 TABLET (25 MG TOTAL) BY MOUTH DAILY., Disp: 90 tablet, Rfl: 3   Nintedanib (OFEV) 100 MG CAPS, Take 1 capsule (100 mg total) by mouth 2 (two) times daily. (Patient not taking: Reported on 08/21/2022), Disp: 180 capsule, Rfl: 1      Objective:   Vitals:   08/28/22 1107  BP: (!) 120/58  Pulse: 70  SpO2: 95%  Weight: 185 lb 12.8 oz (84.3 kg)  Height: 5' 9.5" (1.765 m)    Estimated body mass index is 27.04 kg/m as calculated from the following:   Height as of this encounter: 5' 9.5" (1.765 m).   Weight as of this encounter: 185 lb 12.8 oz (84.3 kg).  @WEIGHTCHANGE @  [employer]   08/28/22 1107  Weight: 185 lb 12.8 oz (84.3 kg)     Physical Exam   General: No distress. Looks well O2 at rest: no Cane present: no Sitting in wheel chair: no Frail: no Obese: no Neuro: Alert and Oriented x 3. GCS 15. Speech normal Psych: Pleasant Resp:  Barrel Chest - no.  Wheeze - no, Crackles - yes base, No overt respiratory distress CVS: Normal heart sounds. Murmurs - no Ext: Stigmata of Connective Tissue Disease - no HEENT: Normal upper  airway. PEERL +. No post nasal drip        Assessment:       ICD-10-CM   1. IPF (idiopathic pulmonary fibrosis) (HCC)  J84.112     2. Chronic cough  R05.3     3. Gastroesophageal reflux disease, unspecified whether esophagitis present  K21.9     4. Sore throat  J02.9          Plan:     Patient Instructions  IPF (idiopathic pulmonary fibrosis) (HCC)   -Based on current symptoms and exercise test and breathing test May 2024 IPF likely stable over the last 1 year. 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 Partly due to season allergies +.- cough variant asthma; blood eosinophil high and aRAST allrgy test positive for tree, cottonw wood in 2023.  Started Fasenra approximately March 2024 with some possible relief as of July 2024. Advair causing sore throast  Plan  - Continue Fasenra through the end of 2024 to see if cough will improve.  - Okay for albuterol as needed -DC Advair out of MAR ; list as allergy  - albuterol as needed - Start over-the-counter Mucinex as needed -If problems persist can consider cough recent study  GERD  Plan  - cotinue GERD control with OTC prilosec  Sore throast Burning lips  - do not see yeast but treat as such  Plan - For Oral thrush: Take Suspension (swish and swallow): 500,000 units 4 times/day for 5 days; swish in the mouth and retain for as long as possible (several minutes) before swallowing. IF nystatin is in back order: alternatives are    Followup 16 weeks weeks 15-minute visit with Dr. Marchelle Gearing  -Symptom score and walking desaturation test at  follow-up   FOLLOWUP Return in about 16 weeks (around 12/18/2022) for 15 min visit, ILD, after Cleda Daub and DLCO, with Dr Marchelle Gearing, Face to Face Visit.    SIGNATURE    Dr. Kalman Shan, M.D., F.C.C.P,  Pulmonary and Critical Care Medicine Staff Physician, [facility] Center Director - Interstitial Lung Disease  Program  Pulmonary Fibrosis [facility] at [facility] [place], [place], [address]  Pager: [phone], If no answer or between  15:00h - 7:00h: call [phone] Telephone: [phone]  11:54 AM 08/28/2022

## 2022-08-28 NOTE — Patient Instructions (Addendum)
IPF (idiopathic pulmonary fibrosis) (HCC)   -Based on current symptoms and exercise test and breathing test May 2024 IPF likely stable over the last 1 year. -Nintedanib  challenge x 2 did not work due to diarrhea and vomitting - Prior intolerance to esbriet   Plan - cma to list ofev as allergy - continue supportive care - do spirometry and dlco in 4 months - will refer to [facility] for research as care option; considerin fall 2024 or Q1 2025  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 Partly due to season allergies +.- cough variant asthma; blood eosinophil high and aRAST allrgy test positive for tree, cottonw wood in 2023.  Started Fasenra approximately March 2024 with some possible relief as of July 2024. Advair causing sore throast  Plan  - Continue Fasenra through the end of 2024 to see if cough will improve.  - Okay for albuterol as needed -DC Advair out of MAR ; list as allergy  - albuterol as needed - Start over-the-counter Mucinex as needed -If problems persist can consider cough recent study  GERD  Plan  - cotinue GERD control with OTC prilosec  Sore throast Burning lips  - do not see yeast but treat as such  Plan - For Oral thrush: Take Suspension (swish and swallow): 500,000 units 4 times/day for 5 days; swish in the mouth and retain for as long as possible (several minutes) before swallowing. IF nystatin is in back order: alternatives are    Followup 16 weeks weeks 15-minute visit with Dr. Marchelle Gearing  -Symptom score and walking desaturation test at follow-up

## 2022-10-15 ENCOUNTER — Other Ambulatory Visit: Payer: Self-pay | Admitting: Internal Medicine

## 2022-10-31 ENCOUNTER — Ambulatory Visit (INDEPENDENT_AMBULATORY_CARE_PROVIDER_SITE_OTHER): Payer: Medicare HMO

## 2022-10-31 DIAGNOSIS — I442 Atrioventricular block, complete: Secondary | ICD-10-CM

## 2022-11-04 LAB — CUP PACEART REMOTE DEVICE CHECK
Battery Remaining Longevity: 126 mo
Battery Remaining Percentage: 100 %
Brady Statistic RA Percent Paced: 7 %
Brady Statistic RV Percent Paced: 100 %
Date Time Interrogation Session: 20241001034100
Implantable Lead Connection Status: 753985
Implantable Lead Connection Status: 753985
Implantable Lead Implant Date: 20211122
Implantable Lead Implant Date: 20211122
Implantable Lead Location: 753859
Implantable Lead Location: 753860
Implantable Lead Model: 7841
Implantable Lead Model: 7842
Implantable Lead Serial Number: 1057413
Implantable Lead Serial Number: 1098402
Implantable Pulse Generator Implant Date: 20211122
Lead Channel Impedance Value: 576 Ohm
Lead Channel Impedance Value: 586 Ohm
Lead Channel Pacing Threshold Amplitude: 0.5 V
Lead Channel Pacing Threshold Amplitude: 0.9 V
Lead Channel Pacing Threshold Pulse Width: 0.4 ms
Lead Channel Pacing Threshold Pulse Width: 0.4 ms
Lead Channel Setting Pacing Amplitude: 2 V
Lead Channel Setting Pacing Amplitude: 2.5 V
Lead Channel Setting Pacing Pulse Width: 0.4 ms
Lead Channel Setting Sensing Sensitivity: 2.5 mV
Pulse Gen Serial Number: 962270
Zone Setting Status: 755011

## 2022-11-06 NOTE — Progress Notes (Signed)
Remote pacemaker transmission.   

## 2022-12-18 ENCOUNTER — Ambulatory Visit: Payer: Medicare HMO | Admitting: Internal Medicine

## 2022-12-18 DIAGNOSIS — J84112 Idiopathic pulmonary fibrosis: Secondary | ICD-10-CM | POA: Diagnosis not present

## 2022-12-18 LAB — PULMONARY FUNCTION TEST
DL/VA % pred: 86 %
DL/VA: 3.41 ml/min/mmHg/L
DLCO cor % pred: 84 %
DLCO cor: 20.28 ml/min/mmHg
DLCO unc % pred: 84 %
DLCO unc: 20.28 ml/min/mmHg
FEF 25-75 Pre: 4 L/s
FEF2575-%Pred-Pre: 201 %
FEV1-%Pred-Pre: 116 %
FEV1-Pre: 3.29 L
FEV1FVC-%Pred-Pre: 114 %
FEV6-%Pred-Pre: 108 %
FEV6-Pre: 4 L
FEV6FVC-%Pred-Pre: 107 %
FVC-%Pred-Pre: 101 %
FVC-Pre: 4 L
Pre FEV1/FVC ratio: 82 %
Pre FEV6/FVC Ratio: 100 %

## 2022-12-18 NOTE — Patient Instructions (Signed)
Spirometry/DLCO performed today. 

## 2022-12-18 NOTE — Progress Notes (Signed)
Spirometry/DLCO performed today. 

## 2023-01-13 NOTE — Progress Notes (Unsigned)
IOV august 2022 for SOB by [facility]  Subjective:   [name] GENDER: male DOB: 1945-12-29, MRN: [id number]  Chief Complaint  Patient presents with   Consult    Pt is being referred due to chronic cough.  Pt states that he does have complaints of chest tightness and SOB with activities.      PMH DMII, high cholesterol. He has recurrent issues with chest tightness. Dry cough, usually non-productive. Occasionally in the morning her has productive cough. He has gerd and takes a PPI. If he does any activity it makes the episodes worse. Hot shower and activity makes it worse. No seasonal changes that he notices. He does have sinus issues at times. Retired at a Programme researcher, broadcasting/film/video. Was in [employer], [place] 1966 - 1971. No pets in the house. PFTS at [facility] ratio normal, FEV1 2L, normal dlco, normal tlc. He stays active and still working part time. He has used albuterol in the past prior to getting his pacemaker.   CT scan of the chest was completed in November 2021 which revealed lower lobe honeycombing and early evidence of interstitial lung disease. xxxxxx  OV 11/23/2020: here to day for follow up. Unfortunately he did not have his CT Chest complete. It was set up through the [place] and he was not prepared to pay for it. His cough is better. His chest tightness is better as well.  Has been using his inhalers.  OV 12/20/2020 -transfer of care to the ILD center to Dr. Marchelle Gearing.  Referred by Dr. Audie Box  Subjective:  [name], male , DOB: 1945-07-26 , age 19 y.o. , MRN: [id number] , ADDRESS: [address] [place] [address] PCP [facility] Patient Care Team: [facility] as PCP - General Rennis Golden Lisette Abu, MD as PCP - Cardiology (Cardiology)  This Provider for this visit: Treatment Team:  Attending Provider: Kalman Shan, MD    12/20/2020 -   Chief Complaint  Patient presents with   Follow-up    Chest  tightness     [name] 77 y.o. -seen in August 2022 in October 2022 by Dr. Tonia Brooms.  He is here with his wife.  He is a Cytogeneticist. Dr Shelle Iron at the [facility] in the area is retired.  Therefore he is seeing [place].  He reports insidious onset of shortness of breath at least a year.  This started around the time he had his pacemaker.  The pacemaker did get rid of his syncope but not the shortness of breath.  Is present on exertion relieved by rest.  Recently Dr. Tonia Brooms started [facility] and this is helped his chest tightness on exertion but he still continues have significant shortness of breath with exertion and cough.  Documented below.  He is aware that he has pulmonary fibrosis.  His symptoms are somewhat progressive.  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  Shared with the family his results.  Review of the records indicate that he has not had serology so far.  An ILD questionnaire packet was mailed out to him but he has not received it yet so we do not have the data.  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  His other medications include Breo and omeprazole.  He has a history of diverticulitis.  No coronary artery disease but he has a pacemaker.  He is not on anticoagulation he is  on aspirin only.  His walking desaturation test shows adequate oxygenation with minimal pulse ox drop.   [facility] Comprehensive ILD Questionnaire -dictated on 03/19/2021  Symptoms:  x    Past Medical History :  -Denies any asthma or COPD or heart failure.  Denies collagen vascular disease..  Denies sleep apnea -Does have acid reflux -Does have diabetes.  Intolerant to prednisone.  Makes him very hyperglycemic -Intolerant to gabapentin -Never had COVID   ROS:  -Does have exertional fatigue -10 pound weight loss prior to the November 2022 consult -Dry eyes present -Does have occasional acid reflux which is fairly well controlled with  omeprazole  FAMILY HISTORY of LUNG DISEASE:  -Mom had asthma  PERSONAL EXPOSURE HISTORY:  -Smoke cigarettes stop smoking 23 years ago.  Never did vaping in the past smoked some amount of marijuana but not much.  No cocaine use no intravenous drug use  HOME  EXPOSURE and HOBBY DETAILS :  -[place] in the urban house [place] 1997.  The house was 78 years old.  No dampness no mold or mildew.  Does not use CPAP.  Extensive organic antigen exposure history is negative  OCCUPATIONAL HISTORY (122 questions) : -He is a veteran but no agent orange exposure.  Has done gardening.  Has done woodwork.  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  Has done a garage repair.  Has done furniture work.  Has done some car Cabin crew work.  Has done machine operating.  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  Intolerant to prednisone. -For postnasal drip has tried nasal spray saline and steroid nasal spray and it does not work  INVESTIGATIONS: -Below   CT Chest data - HRCT 11/27/20  Study Result  Narrative & Impression  CLINICAL DATA:  77 year old male with history of interstitial lung disease. Follow-up study.   EXAM: CT CHEST WITHOUT CONTRAST   TECHNIQUE: Multidetector CT imaging of the chest was performed following the standard protocol without intravenous contrast. High resolution imaging of the lungs, as well as inspiratory and expiratory imaging, was performed.   COMPARISON:  Chest CT 12/19/2019.   FINDINGS: Cardiovascular: Heart size is normal. There is no significant pericardial fluid, thickening or pericardial calcification. There is aortic atherosclerosis, as well as atherosclerosis of the great vessels of the mediastinum and the coronary arteries, including calcified atherosclerotic plaque in the left main, left anterior descending, left circumflex and right coronary  arteries. Severe calcifications of the aortic valve and mitral annulus. Left-sided pacemaker device in place with lead tips terminating in the right atrial appendage and right ventricular apex.   Mediastinum/Nodes: No pathologically enlarged mediastinal or hilar lymph nodes. Please note that accurate exclusion of hilar adenopathy is limited on noncontrast CT scans. Esophagus is unremarkable in appearance. No axillary lymphadenopathy.   Lungs/Pleura: High-resolution images demonstrate widespread but patchy areas of peripheral predominant ground-glass attenuation, septal thickening, subpleural reticulation, cylindrical bronchiectasis and peripheral bronchiolectasis. Findings appear progressive compared to the prior study. Findings have a definitive craniocaudal gradient. No frank honeycombing is confidently identified at this time. Inspiratory and expiratory imaging demonstrates some very mild air trapping in the lung bases indicative of mild small airways disease. No acute consolidative airspace disease. No pleural effusions. No suspicious appearing pulmonary nodules or masses are noted.   Upper Abdomen: Aortic atherosclerosis.   Musculoskeletal: Orthopedic fixation hardware in the lower cervical spine incidentally noted. There  are no aggressive appearing lytic or blastic lesions noted in the visualized portions of the skeleton.   IMPRESSION: 1. Progressively worsening interstitial lung disease, 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 2. Aortic atherosclerosis, in addition to left main and 3 vessel coronary artery disease. Assessment for potential risk factor modification, dietary therapy or pharmacologic therapy may be warranted, if clinically indicated. 3. There are calcifications of the aortic valve and mitral annulus. Echocardiographic correlation for evaluation of potential valvular dysfunction may be  warranted if clinically indicated.   Aortic Atherosclerosis (ICD10-I70.0).     Electronically Signed   By: Trudie Reed M.D.   On: 11/28/2020 12:46    No results found.  Cardoiac cath  oct 2021 Mid RCA lesion is 25% stenosed. Prox Cx to Mid Cx lesion is 25% stenosed. Prox LAD to Mid LAD lesion is 25% stenosed. The left ventricular systolic function is normal. LV end diastolic pressure is normal. The left ventricular ejection fraction is 55-65% by visual estimate. There is no aortic valve stenosis.   Nonobstructive CAD.  Continue preventive therapy  PFT  No flowsheet data found.   Latest Reference Range & Units 11/19/19 12:17  Creatinine 0.61 - 1.24 mg/dL 0.96    Latest Reference Range & Units 11/19/19 12:17  Hemoglobin 13.0 - 17.0 g/dL 04.5       OV 4/0/9811  Subjective:  [name], male , DOB: April 09, 1945 , age 12 y.o. , MRN: [id number] , ADDRESS: [address] [place] [address] PCP [facility] Patient Care Team: [facility] as PCP - General Rennis Golden Lisette Abu, MD as PCP - Cardiology (Cardiology)  This Provider for this visit: Treatment Team:  Attending Provider: Kalman Shan, MD  Type of visit:Video Circumstance: COVID-19 national emergency Identification of patient Scott Price with 1945/05/20 and MRN [id number] - 2 person identifier Risks: Risks, benefits, limitations of telephone visit explained. Patient understood and verbalized agreement to proceed Anyone else on call: his wife on side Patient location: [address] [place] [address] This provider location: [address], [address]; [place]; [place] [address]. [facility]. [phone]    02/08/2021 - clinical IPF followup - to assess esbriet uptake    [name] 77 y.o. - only on 2nd week of esbreit. On 2 pills tid. Sunday 02/10/21 ends 2pills tid. On Monday 02/11/21 will go to 3 pills tid. No side effects. Spacing 5-6h apart .  Does not wear sunscreen bu wears hat and long sleeve clothes. Does have sunscren. Reviewd serology and cbc/chmeistry - these are normal  However, reports chest congestion. Hacks and coughs. Sputum +. Says it is baseline few months ago. Rates  it as 4-5 of 5.Does not know sputum color - but thinks it is clear. Not wheezing. Clears throat. fEels he says he cannot take prednisone - even 5 days - drives him hyperglucemic 400s  AUTOIMMUNE 12/20/20   Latest Reference Range & Units 11/19/19 12:17 12/14/19 09:41 04/27/20 15:59 12/20/20 15:13  Sed Rate 0 - 20 mm/hr    7  Glucose 70 - 99 mg/dL 664 (H) 403 (H) 474 (H) 166 (H)  Anti Nuclear Antibody (ANA) NEGATIVE     NEGATIVE  Angiotensin-Converting Enzyme 9 - 67 U/L    20  ds DNA Ab IU/mL    <1  RA Latex Turbid. <14 IU/mL    <14  SSA (Ro) (ENA) Antibody,  IgG <1.0 NEG AI    <1.0 NEG  SSB (La) (ENA) Antibody, IgG <1.0 NEG AI    <1.0 NEG  Scleroderma (Scl-70) (ENA) Antibody, IgG <1.0 NEG AI    <1.0 NEG  QUANTIFERON-TB GOLD PLUS     Rpt  (H): Data is abnormally high Rpt: View report in Results Review for more information   PFT  No flowsheet data found  OV 03/19/2021  Subjective:  [name], male , DOB: Jun 14, 1945 , age 8 y.o. , MRN: [id number] , ADDRESS: [address] [place] [address] PCP [facility] Patient Care Team: [facility] as PCP - General Rennis Golden Lisette Abu, MD as PCP - Cardiology (Cardiology)  This Provider for this visit: Treatment Team:  Attending Provider: Kalman Shan, MD    03/19/2021 -   Chief Complaint  Patient presents with   Follow-up    Pt states he has been doing okay since last visit. States he does still become SOB with exertion and still has complaints of coughing that is worse in the mornings.   Diagnois is based on age > 60, ex smoker, progressive disease, probable UIP pattern on CT., negative serolgy and ILD qquewtionaire and acid reflux  -Start pirfenidone  around Christmas 2022 -Significant out of proportion cough  [name] 77 y.o. -returns for follow-up.  I reviewed his ILD questionnaire and his exposures consistent with IPF.  Currently he is on pirfenidone.  He is now on 3 pills 3 times daily for at least a month.  He says ever since going to 3 pills 3 times daily he has occasional mild acid reflux which his wife also test.  He feels a heartburn.  It is mild and tolerable.  He does take omeprazole but he is taking it with food as opposed to empty stomach.  There is no nausea.  No vomiting no diarrhea.  He feels his weight is stable.  Overall he is pleased with his Esbriet tolerance.  His chronic symptoms of shortness of breath and cough are stable although the cough is the most significant of symptoms.  He rates it as a 3 out of 5.  It is worse early in the morning than the rest of the day.  There is clear sputum.  Last visit I prescribed Robitussin which is helping him.  I also suggested he take Tessalon but he he is unable to afford a $66 co-pay.  We discussed gabapentin but this makes him sleepy from a previous neuropathic experience.  We discussed prednisone but this makes him hyperglycemic he does not want to do it.  We discussed opioids but he does not want to do that either.  He is on chronic Afrin nasal spray.  We discussed taking saline nasal spray or nasal steroids for his sinus drainage but he feels those things do not help.  Nevertheless he is willing to try it.  He is going to have a liver function test today  We discussed participation in clinical trials and he is interested in the future. y    No results found.    PFT  No flowsheet data found.    OV 05/30/2021  Subjective:  [name], male , DOB: 05/20/1945 , age 62 y.o. , MRN: [id number] , ADDRESS: [address] [place] [address] PCP [facility] Patient Care Team: [facility] as PCP - General Rennis Golden Lisette Abu, MD as PCP -  Cardiology (Cardiology)  This Provider for this visit: Treatment Team:  Attending Provider: Kalman Shan, MD    05/30/2021 -   Chief Complaint  Patient presents with   Follow-up    PFT performed today.  Pt states that he began breaking out on his face about 2 weeks ago. States his breathing has been about the same.     [name] 77 y.o. -returns for follow-up.  He is here with his wife.  He is a little bit upset because of running 30 minutes behind.  Apologize for this delay.  He tells me respiratory wise he stable.  He had pulmonary function test that looks for fairly robust.  However in the last 3 weeks he has had rash in his face.  This whole face is red.  He also some increased erythema in his bilateral forearms.  These are sun exposed areas.  He does admit to applying sunscreen but he is on pirfenidone at the same time.  2 weeks ago he ended up in the emergency department on 05/18/2021 and was given valacyclovir for blistering in the lips but this has not helped.  He is applying Chapstick.  He is frustrated by this.  Also simultaneously since starting pirfenidone he has had altered taste with food.   We did discuss potential steroids for his rash but he wants to follow with his primary dermatologist through the [place].  He is also worried about steroids because of its negative effect with his blood sugars.     OV 08/23/2021  Subjective:  [name], male , DOB: September 09, 1945 , age 45 y.o. , MRN: [id number] , ADDRESS: [address] [place] [address] PCP [facility] Patient Care Team: [facility] as PCP - General Rennis Golden Lisette Abu, MD as PCP - Cardiology (Cardiology)  This Provider for this visit: Treatment Team:  Attending Provider: Kalman Shan, MD    08/23/2021 -   Chief Complaint  Patient presents with   Follow-up    Follow-up   [name] 77 y.o. -returns for follow-up.  He is no longer on pirfenidone after  stopping pirfenidone in April 2023 his dysgeusia resolved.  Rash also is resolved itching also resolved.  He did see nurse practitioner in May 2023 and by this time his rash was resolving.  Currently is not on any antifibrotic.  Overall he feels stable.  He is aware that nintedanib the other antifibrotic was considered second line for him because of the history of diverticulitis.  He tells me that has not had diverticulitis flareup in a long time.  He does not have any heart disease issues except he has pacemaker.  He has never had a heart attack.  He is aware now that nintedanib is very rarely associated with slight increase in MI risk.  Recently the mayor of [place] who is the longest serving mayor in any city [place] passed away from pulmonary fibrosis.  This was a public news.  He says he is feeling sad because of that.  Wife states "it hit him hard".  Therefore he is more inclined to try nintedanib.  [facility].  In terms of symptoms he still has his cough.  He clears his throat a lot.  Albuterol helps.  We talked about over-the-counter Mucinex he is open to this idea.  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  Because of this he did not do walk test.    OV 11/28/2021  Subjective:  [name], male , DOB: 02-28-1945 , age 7 y.o. , MRN: [id number] , ADDRESS: [address] [place] [address] PCP [facility] Patient Care Team: [facility] as PCP - General Hilty, Lisette Abu, MD as PCP - Cardiology (Cardiology)  This Provider for this visit: Treatment Team:  Attending Provider: Kalman Shan, MD     11/28/2021 -   Chief Complaint  Patient presents with   Office Visit    Had PFT today PT states no changes since last visit     [name] 77 y.o. -for follow-up.  Presents with his wife.  He is currently on a second bottle of Ofev 100 mg twice  daily.  He is tolerating it well without any GI symptoms.  His symptoms are stable.  His pulmonary function test is stable except for very mild reduction in DLCO which could be technical variation.  He has no change in his baseline symptoms other than the fact he has got cough that is still present it is mild to moderate in severity and annoying.  Review of the records indicate that his mom had asthma and earlier in the year he did feel better with the chest tightness and some cough with Breo.  Review of the labs indicate that he is all very had elevated blood eosinophils 5 cells per cubic millimeter in 2023.  He has never had allergy testing but he does report seasonal variation with a cough.      OV 03/06/2022  Subjective:  [name], male , DOB: Oct 29, 1945 , age 89 y.o. , MRN: [id number] , ADDRESS: [address] [place] [address] PCP [facility] Patient Care Team: [facility] as PCP - General Rennis Golden Lisette Abu, MD as PCP - Cardiology (Cardiology)  This Provider for this visit: Treatment Team:  Attending Provider: Kalman Shan, MD    03/06/2022 -   Chief Complaint  Patient presents with   Follow-up    Pft review     [name] 77 y.o. -returns for follow-up.  Presents with his wife.  Overall he feels he is doing stable.  [facility]  He is on Advair for the last few to several months.  He is overall feeling stable but he feels the cough is the most bothersome problem although the cough itself is stable it is quite bothersome.  It is mostly in the day even at rest.  The wife describes it as bad and she is an independent historian today.  It does not bother his sleep except occasionally.  There are no new problems.  He is saying there is a lot of sinus drainage.  Last visit we did a RAST allergy panel and blood eosinophils both elevated and abnormal.  No emergency room visits no urgent care visits no  hospitalizations.  The results of the RAST allergy panel were reviewed.  In terms of his IPF symptom score is below.  He had pulmonary function test.  Compared to 1 year ago there is a slight decline in FVC and DLCO although quite acceptable.  He is tolerating his low-dose nintedanib protocol 100 mg twice daily fine     OV 07/03/2022  Subjective:  [name], male , DOB: 1945/12/11 , age 68 y.o. , MRN: [id number] ,  ADDRESS: [address] [place] [address] PCP [facility] Patient Care Team: [facility] as PCP - General Rennis Golden Lisette Abu, MD as PCP - Cardiology (Cardiology)  This Provider for this visit: Treatment Team:  Attending Provider: Kalman Shan, MD  07/03/2022 -   Chief Complaint  Patient presents with   Follow-up    F/up on PFT     [name] 77 y.o. -presents with his wife.  Last seen February 2024.  At the time he was tolerating nintedanib low-dose protocol.  Then he called in March 2024 because of severe diarrhea and he stopped nintedanib.  Since then the diarrhea has resolved.  His shortness of breath is stable.  His cough is also stable.  But it is bothersome.  The cough is more than the shortness of breath.  Wife level the cough is 2 out of 5 but he thought it was more 4 out of 5.  It is a dry cough occasionally there are some wheezing.  He has been started on Fasenra because of RAST allergy positivity and high blood eosinophils.  He is approaching 8 weeks in June 2024.  So far the cough is not improved.  Did indicate to him that we will have to try for a total of 6 months before figuring out if the [facility] Challenger will help his cough or not.  Meanwhile he is asking if he can go back on the nintedanib.  He is aware of diverticulitis risk.  He is also aware that and he feels that if the diarrhea comes back with nintedanib then he will stop nintedanib altogether.  He wants to rechallenge.  I supported him on this.  He had a pulmonary  function test today it is actually stable.  I do not think the decline from 1 year ago is significant.  He did a sit/stand exercise hypoxemia test.  He did get short of breath but his pulse ox did not drop at all.     OV 08/28/2022  Subjective:  [name], male , DOB: [birth year] , age 34 y.o. , MRN: [id number] , ADDRESS: [address] [place] [address] [facility]CP [facility] Patient Care Team: [facility] as PCP - General Hilty, Lisette Abu, MD as PCP - Cardiology (Cardiology) Marinus Maw, MD as PCP - Electrophysiology (Cardiology)  This Provider for this visit: Treatment Team:  Attending Provider: Kalman Shan, MD    08/28/2022 -   Chief Complaint  Patient presents with   Follow-up    F/up, no complaints      [name] 77 y.o. -returns for follow-up..  Presents with his wife who uses a walker.  She is an independent historian.  They both attest the same thing.  He tried nintedanib again and this time every time he tried he started vomiting so he stopped.  He only took it for a total of 3 pills for 3 days.  Shortness of breath is stable.  His sit/stand excess hypoxemia test today is also stable.  He did not have pulmonary function test.  He continues on Fasenra which was started for any allergic component to his chronic cough and he says it is somewhat helping.  He did not fill out his cough severely.  He was on Advair but he quit taking it because it was causing sore throat and also sensation of tongue and lips burning apparently is been going on for a year.  He did stop the Advair so he is going to see if this is getting better but after stopping the Advair his cough is definitely better.  For now we have opted to continue with Fasenra.  In the past he was not interested in clinical trials as a care option but at this point in time he is interested given the fact he does not want to challenge himself with pirfenidone or nintedanib ever  again.  We talked about various clinical trials that involve pills [some GI side effect risk] versus injection subcutaneous not known to have GI side effects, nebulizer that has oral and chest side effects but that are local and reversible].  He is interested in any of these options but only after October 2024 when he retired from working at [employer].    OV 01/14/2023  Subjective:  [name], male , DOB: December 03, 1945 , age 9 y.o. , MRN: [id number] , ADDRESS: [address] [place] [address] PCP [facility] Patient Care Team: [facility] as PCP - General Hilty, Lisette Abu, MD as PCP - Cardiology (Cardiology) Marinus Maw, MD as PCP - Electrophysiology (Cardiology)  This Provider for this visit: Treatment Team:  Attending Provider: Kalman Shan, MD  Diagnois is based on age > 80, ex smoker, progressive disease, probable UIP pattern on CT., negative serolgy and ILD qquewtionaire and acid reflux  -Start pirfenidone around Christmas 2022 - stopped 05/30/21 due to rash/taste  -Started low-dose nintedanib 100 mg twice daily September 2023 -?>  Stopped due to diarrhea March 2024  -Clinical research as a care option discussion done February 2023 in February 2024: Not interested.  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  Stopped December 2024   Works at [employer]   01/14/2023 -   Chief Complaint  Patient presents with   Follow-up    Breathing is overall doing well. He has some wheezing and cough at night- prod with clear sputum.      [name] 77 y.o. -returns for follow-up.  This time his wife  is not with him.  But his son-in-law Alvera Singh is that with him.  Feliz Beam is somewhat of an independent historian.  Patient tells me that shortness of breath is stable by the office also stable but the cough is the biggest problem and it is unchanged.  The Harrington Challenger is not helping.  He has been on it for 8 months now.  Therefore we took a shared decision making to stop it.  We talked about alternatives.  We recommended gabapentin because he does not wake up in the middle of the night from his cough.  Is present mostly in the daytime and as soon as he gets up in the morning.  He clears his throat.  He also is pointing to his throat and upper airway as a source of the cough.  However he does not want to do gabapentin because he said he is somebody at the [place] gave it to him for some unclear reason of year or 2 ago and it gave him side effects not otherwise specified.  We discussed chronic prednisone he  does not want to do it because of hyperglycemia.  Therefore we took a shared decision making to do Hycodan which is opioid.  Cautioned about the side effects.  I had pulmonary function test in November 2024 and is stable he is not taking his Advair.  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   Last CT scan of the chest October 2022 and without any cancer.  Currently 77 years old.   SYMPTOM SCALE - ILD 12/20/2020 05/30/2021 Esbriet - rash 08/23/2021 Not on any antifibroic 11/28/2021 Ofev 100mg  bid 03/06/2022 Ofev 100mg  bid 07/03/2022 Ofev holdiay since 11914 due to diarrha. On fasenral x 8 weeks 08/28/2022 Off ofev, Failed 2nd challeng. On Fasenral since march 203 01/14/2023 Off nintedanib.  Still on Harrington Challenger but it is not helping him.  PFT stable.  Current weight          O2 use ra ra ra ra ra ra ra ra  Shortness of Breath 0 -> 5 scale with 5 being worst (score 6 If unable to do)         At rest 2 1 2 1 2 3 2 2   Simple tasks - showers, clothes change, eating, shaving 3 3 3 2 2 1 2 4    Household (dishes, doing bed, laundry) 4 3 3 1 2  0 2 4  Shopping 3 3 2 2 2 1 3 3   Walking level at own pace 3 4 3 3 3 2 3 4   Walking up Stairs Does not do 4 3 3 3 1 3 4   Total (30-36) Dyspnea Score 15 18 16 13 14 8 15 21   How bad is your cough? 4 4 3  - worse at night 3 4 2  x 4  How bad is your fatigue 4 5 3 4 4 1 3 4   How bad is nausea 0 0 0 0 0 0 0 0  How bad is vomiting?  0 0 0 0 0 0 0 0  How bad is diarrhea? 1 0 0 0 0 0 1 0  How bad is anxiety? 5 4 0 0 0 0 4 4  How bad is depression 0 1 0 0 0 0 0 2  Any chronic pain - if so where and how bad 0  Rt knoee 0 0 0 x z  0    Simple office walk 185 feet x  3 laps goal with forehead probe 12/20/2020  03/19/2021  07/03/2022  08/28/2022   O2 used ra ra ra ra  Number laps completed 3 3 Sit stand x 10 Sit stand x 01  Comments about pace 3     Resting Pulse Ox/HR 99% and 78/min 100 and 79 98% and HR 72 97% and HR 66  Final Pulse Ox/HR 97% and 96/min 100% and 90 98% and HR  81 98% and HR 72  Desaturated </= 88% no no no   Desaturated <= 3% points no no no   Got Tachycardic >/= 90/min yes yes no   Symptoms at end of test Mild dyspnea Mild dyspnea Yes some  yes  Miscellaneous comments x x        PFT     Latest Ref Rng & Units 12/18/2022   12:21 PM 06/24/2022    3:45 PM 03/06/2022    8:40 AM 11/28/2021    9:48 AM 05/30/2021    1:11 PM  ILD indicators  FVC-Pre L 4.00   4.15  4.27  4.32  FVC-Predicted Pre % 101  106  104  107  107   FVC-Post L  4.15      FVC-Predicted Post %  104      TLC L  7.33      TLC Predicted %  107      DLCO uncorrected ml/min/mmHg 20.28  19.22  18.84  18.43  20.11   DLCO UNC %Pred % 84  79  78  76  83   DLCO Corrected ml/min/mmHg 20.28  19.22  18.84  18.43  20.11   DLCO COR %Pred % 84  79  78  76  83       LAB RESULTS last 96 hours No results found.  LAB RESULTS last 90 days Recent Results (from the past 2160 hour(s))  CUP PACEART REMOTE DEVICE CHECK     Status: None   Collection Time: 11/04/22   3:41 AM  Result Value Ref Range   Date Time Interrogation Session [id number]    Pulse Generator Manufacturer BOST    Pulse Gen Model L331 ACCOLADE MRI EL    Pulse Gen Serial Number [id number]    Clinic Name [facility]    Implantable Pulse Generator Type Implantable Pulse Generator    Implantable Pulse Generator Implant Date [id number]    Implantable Lead Manufacturer BOST    Implantable Lead Model 7841 Ingevity + MRI    Implantable Lead Serial Number [id number]    Implantable Lead Implant Date [id number]    Implantable Lead Location Detail 1 UNKNOWN    Implantable Lead Location [id number]    Implantable Lead Connection Status [id number]    Implantable Lead Manufacturer BOST    Implantable Lead Model 7842 Ingevity + MRI    Implantable Lead Serial Number [id number]    Implantable Lead Implant Date [id number]    Implantable Lead Location Detail 1 UNKNOWN    Implantable Lead Location [id number]    Implantable Lead Connection Status [phone]    Lead Channel Setting Sensing Sensitivity 2.5 mV   Lead Channel Setting Sensing Adaptation Mode Fixed Pacing    Lead Channel Setting Pacing Amplitude 2.0 V   Lead Channel Setting Pacing Pulse Width 0.4 ms   Lead Channel Setting Pacing Amplitude 2.5 V   Zone Setting Status [id number]    Lead Channel Impedance Value 586 ohm   Lead Channel Pacing Threshold Amplitude 0.5 V   Lead Channel Pacing Threshold Pulse Width 0.4 ms   Lead Channel Impedance Value 576 ohm   Lead Channel Pacing Threshold Amplitude 0.9 V   Lead Channel Pacing Threshold Pulse Width 0.4 ms   Battery Status BOS    Battery Remaining Longevity 126 mo   Battery Remaining Percentage 100 %   Brady Statistic RA Percent Paced 7 %   Brady Statistic RV Percent Paced 100 %  Pulmonary function test     Status: None   Collection Time: 12/18/22 12:21 PM  Result Value Ref Range   FVC-Pre 4.00 L   FVC-%Pred-Pre 101 %   FEV1-Pre 3.29 L   FEV1-%Pred-Pre 116 %   FEV6-Pre 4.00 L   FEV6-%Pred-Pre 108 %   Pre  FEV1/FVC ratio 82 %   FEV1FVC-%Pred-Pre 114 %   Pre FEV6/FVC Ratio 100 %   FEV6FVC-%Pred-Pre 107 %   FEF 25-75 Pre 4.00 L/sec   FEF2575-%Pred-Pre 201 %   DLCO unc 20.28 ml/min/mmHg   DLCO unc % pred 84 %   DLCO cor 20.28 ml/min/mmHg   DLCO cor % pred 84 %  DL/VA 9.93 ml/min/mmHg/L   DL/VA % pred 86 %         has a past medical history of Diabetes mellitus without complication (HCC) and High cholesterol.   reports that he quit smoking about 24 years ago. His smoking use included cigarettes. He has never used smokeless tobacco.  Past Surgical History:  Procedure Laterality Date   BACK SURGERY     LEFT HEART CATH AND CORONARY ANGIOGRAPHY N/A 11/22/2019   Procedure: LEFT HEART CATH AND CORONARY ANGIOGRAPHY;  Surgeon: Corky Crafts, MD;  Location: [facility] INVASIVE CV LAB;  Service: Cardiovascular;  Laterality: N/A;   NECK SURGERY     PACEMAKER IMPLANT N/A 12/26/2019   Procedure: PACEMAKER IMPLANT;  Surgeon: Marinus Maw, MD;  Location: [facility] INVASIVE CV LAB;  Service: Cardiovascular;  Laterality: N/A;    Allergies  Allergen Reactions   Doxycycline Shortness Of Breath and Rash   Advair Hfa [Fluticasone-Salmeterol] Other (See Comments)    Thrush and sore throat   Lisinopril Cough   Ofev [Nintedanib] Nausea And Vomiting   Amoxicillin Rash   Lasix [Furosemide] Rash   Pirfenidone Rash   Prednisone     High blood sugar   Sulfa Antibiotics Rash    Headache/ Chest tightness/ stiff neck    Immunization History  Administered Date(s) Administered   Fluad Quad(high Dose 65+) 11/18/2021   Influenza Split 11/22/2012, 11/01/2015, 11/06/2016   Influenza, High Dose Seasonal PF 01/31/2011, 12/16/2013, 01/09/2020, 11/15/2020, 11/11/2022   Influenza-Unspecified 01/18/2007, 01/13/2008, 01/12/2009, 11/03/2009, 10/05/2011, 11/03/2012, 12/04/2014, 10/13/2017, 01/13/2019, 11/03/2020, 11/19/2021   Moderna Covid-19 Fall Seasonal Vaccine 46yrs & older 03/19/2022, 11/11/2022   Moderna  Covid-19 Vaccine Bivalent Booster 54yrs & up 12/25/2020   Moderna Sars-Covid-2 Vaccination 04/11/2019, 05/09/2019, 01/17/2020, 06/26/2020   PNEUMOCOCCAL CONJUGATE-20 09/24/2020   Pneumococcal Conjugate-13 10/10/2014   Pneumococcal Polysaccharide-23 01/08/2012, 09/24/2020   Pneumococcal-Unspecified 09/13/2006, 01/08/2012   Rsv, Bivalent, Protein Subunit Rsvpref,pf Verdis Frederickson) 04/11/2022   Tdap 10/10/2014   Tetanus Immune Globulin 01/08/2012    Family History  Problem Relation Age of Onset   CAD Brother    CAD Brother      Current Outpatient Medications:    albuterol (VENTOLIN HFA) 108 (90 Base) MCG/ACT inhaler, Inhale 2 puffs into the lungs every 6 (six) hours as needed for wheezing or shortness of breath., Disp: 8 g, Rfl: 6   aspirin 81 MG EC tablet, Take 81 mg by mouth daily. , Disp: , Rfl:    Benralizumab (FASENRA PEN) 30 MG/ML SOAJ, Inject 1 mL (30 mg total) into the skin every 8 (eight) weeks. MAINTENANCE DOSE, Disp: 1 mL, Rfl: 1   Benralizumab (FASENRA PEN) 30 MG/ML SOAJ, Inject 30mg  into the skin at Week 0, Week 4, Week 8., Disp: 1 mL, Rfl: 2   Cholecalciferol (VITAMIN D3) 25 MCG (1000 UT) CAPS, Take 1,000 Units by mouth daily., Disp: , Rfl:    cyanocobalamin 1000 MCG tablet, Take 1,000 mcg by mouth daily., Disp: , Rfl:    dextromethorphan-guaiFENesin (MUCINEX DM) 30-600 MG 12hr tablet, Take 1 tablet by mouth 2 (two) times daily., Disp: , Rfl:    empagliflozin (JARDIANCE) 10 MG TABS tablet, Take 5 mg by mouth in the morning and at bedtime., Disp: , Rfl:    ferrous sulfate 325 (65 FE) MG tablet, Take 325 mg by mouth daily with breakfast., Disp: , Rfl:    fexofenadine (ALLEGRA) 180 MG tablet, Take 180 mg by mouth daily., Disp: , Rfl:    fluticasone-salmeterol (WIXELA INHUB) 250-50 MCG/ACT AEPB, Inhale  1 puff into the lungs in the morning and at bedtime., Disp: 60 each, Rfl: 11   glipiZIDE (GLUCOTROL) 10 MG tablet, Take 5 mg by mouth 2 (two) times daily before a meal., Disp: , Rfl:     HYDROcodone bit-homatropine (HYCODAN) 5-1.5 MG/5ML syrup, Take 5 mLs by mouth every 12 (twelve) hours as needed for cough (IPF refractory cough)., Disp: 240 mL, Rfl: 0   magnesium oxide (MAG-OX) 400 MG tablet, Take 400 mg by mouth 2 (two) times daily., Disp: , Rfl:    meclizine (ANTIVERT) 25 MG tablet, Take 25 mg by mouth 3 (three) times daily as needed for dizziness., Disp: , Rfl:    meloxicam (MOBIC) 15 MG tablet, Take by mouth daily., Disp: , Rfl:    metFORMIN (GLUCOPHAGE) 1000 MG tablet, Take 1 tablet (1,000 mg total) by mouth in the morning and at bedtime., Disp: , Rfl:    Multiple Vitamin (MULTI-VITAMIN) tablet, Take 1 tablet by mouth daily., Disp: , Rfl:    Nintedanib (OFEV) 100 MG CAPS, Take 1 capsule (100 mg total) by mouth 2 (two) times daily., Disp: 180 capsule, Rfl: 1   nystatin (MYCOSTATIN) 100000 UNIT/ML suspension, Take 5 mLs (500,000 Units total) by mouth 4 (four) times daily., Disp: 473 mL, Rfl: 1   omeprazole (PRILOSEC) 20 MG capsule, Take 20 mg by mouth See admin instructions. Every other night, Disp: , Rfl:    rosuvastatin (CRESTOR) 40 MG tablet, Take 40 mg by mouth at bedtime., Disp: , Rfl:    traZODone (DESYREL) 50 MG tablet, Take by mouth as needed for sleep., Disp: , Rfl:    losartan (COZAAR) 25 MG tablet, TAKE 1 TABLET (25 MG TOTAL) BY MOUTH DAILY., Disp: 90 tablet, Rfl: 3      Objective:   Vitals:   01/14/23 0920  BP: 136/71  Pulse: 77  SpO2: 98%  Weight: 181 lb 9.6 oz (82.4 kg)  Height: 5' 9.5" (1.765 m)    Estimated body mass index is 26.43 kg/m as calculated from the following:   Height as of this encounter: 5' 9.5" (1.765 m).   Weight as of this encounter: 181 lb 9.6 oz (82.4 kg).  @WEIGHTCHANGE @  Filed Weights   01/14/23 0920  Weight: 181 lb 9.6 oz (82.4 kg)     Physical Exam   General: No distress. Looks well O2 at rest: no Cane present: no Sitting in wheel chair: no Frail: no Obese: no Neuro: Alert and Oriented x 3. GCS 15. Speech  normal Psych: Pleasant Resp:  Barrel Chest - no.  Wheeze - no, Crackles - no, No overt respiratory distress CVS: Normal heart sounds. Murmurs - no Ext: Stigmata of Connective Tissue Disease - no HEENT: Normal upper airway. PEERL +. No post nasal drip        Assessment:       ICD-10-CM   1. 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 Partly due to season allergies +.- cough variant asthma; blood eosinophil high and aRAST allrgy test positive for tree, cottonw wood in 2023.  Started Fasenra approximately March 2024 with NO relief  Plan  - STOP FASENRA - no gabapentin or prednisone due to prior side effects - STRT HYCODAN cough syrup 5mL Twice daily as needed  - Okay for albuterol as needed -DC Advair out of MAR ; list as allergy  - albuterol as needed - Start over-the-counter Mucinex as needed   GERD  Plan  - cotinue GERD control with OTC prilosec   Followup 6 months 15-minute visit with Dr. Marchelle Gearing  -Symptom score and walking desaturation test at follow-up   FOLLOWUP Return in about 6 months (around 07/15/2023) for 15 min visit, ILD, with Dr Marchelle Gearing, Face to Face Visit.    SIGNATURE    Dr. Kalman Shan, M.D., F.C.C.P,  Pulmonary and Critical Care Medicine Staff Physician, [facility] Center Director -  Interstitial Lung Disease  Program  Pulmonary Fibrosis [facility] [place], [place], [address]  Pager: [phone], If no answer or between  15:00h - 7:00h: call [phone] Telephone: [phone]  9:57 AM 01/14/2023

## 2023-01-13 NOTE — Patient Instructions (Signed)
IPF (idiopathic pulmonary fibrosis) (HCC)   -Based on current symptoms and exercise test and breathing test May 2024 IPF likely stable over the last 1 year. -Nintedanib  challenge x 2 did not work due to diarrhea and vomitting - Prior intolerance to esbriet   Plan - cma to list ofev as allergy - continue supportive care - do spirometry and dlco in 4 months - will refer to [facility] for research as care option; considerin fall 2024 or Q1 2025  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 Partly due to season allergies +.- cough variant asthma; blood eosinophil high and aRAST allrgy test positive for tree, cottonw wood in 2023.  Started Fasenra approximately March 2024 with some possible relief as of July 2024. Advair causing sore throast  Plan  - Continue Fasenra through the end of 2024 to see if cough will improve.  - Okay for albuterol as needed -DC Advair out of MAR ; list as allergy  - albuterol as needed - Start over-the-counter Mucinex as needed -If problems persist can consider cough recent study  GERD  Plan  - cotinue GERD control with OTC prilosec  Sore throast Burning lips  - do not see yeast but treat as such  Plan - For Oral thrush: Take Suspension (swish and swallow): 500,000 units 4 times/day for 5 days; swish in the mouth and retain for as long as possible (several minutes) before swallowing. IF nystatin is in back order: alternatives are    Followup 16 weeks weeks 15-minute visit with Dr. Marchelle Gearing  -Symptom score and walking desaturation test at follow-up

## 2023-01-14 ENCOUNTER — Encounter: Payer: Self-pay | Admitting: Internal Medicine

## 2023-01-14 ENCOUNTER — Ambulatory Visit (INDEPENDENT_AMBULATORY_CARE_PROVIDER_SITE_OTHER): Payer: No Typology Code available for payment source | Admitting: Internal Medicine

## 2023-01-14 VITALS — BP 136/71 | HR 77 | Ht 69.5 in | Wt 181.6 lb

## 2023-01-14 DIAGNOSIS — J84112 Idiopathic pulmonary fibrosis: Secondary | ICD-10-CM

## 2023-01-14 MED ORDER — HYDROCODONE BIT-HOMATROP MBR 5-1.5 MG/5ML PO SOLN: 5.0000 mL | Freq: Two times a day (BID) | ORAL | 0 refills | Status: DC | PRN

## 2023-01-30 ENCOUNTER — Ambulatory Visit (INDEPENDENT_AMBULATORY_CARE_PROVIDER_SITE_OTHER): Payer: No Typology Code available for payment source

## 2023-01-30 DIAGNOSIS — I442 Atrioventricular block, complete: Secondary | ICD-10-CM

## 2023-01-31 LAB — CUP PACEART REMOTE DEVICE CHECK
Battery Remaining Longevity: 120 mo
Battery Remaining Percentage: 100 %
Brady Statistic RA Percent Paced: 7 %
Brady Statistic RV Percent Paced: 100 %
Date Time Interrogation Session: 20241227034200
Implantable Lead Connection Status: 753985
Implantable Lead Connection Status: 753985
Implantable Lead Implant Date: 20211122
Implantable Lead Implant Date: 20211122
Implantable Lead Location: 753859
Implantable Lead Location: 753860
Implantable Lead Model: 7841
Implantable Lead Model: 7842
Implantable Lead Serial Number: 1057413
Implantable Lead Serial Number: 1098402
Implantable Pulse Generator Implant Date: 20211122
Lead Channel Impedance Value: 615 Ohm
Lead Channel Impedance Value: 635 Ohm
Lead Channel Pacing Threshold Amplitude: 0.5 V
Lead Channel Pacing Threshold Amplitude: 1 V
Lead Channel Pacing Threshold Pulse Width: 0.4 ms
Lead Channel Pacing Threshold Pulse Width: 0.4 ms
Lead Channel Setting Pacing Amplitude: 2 V
Lead Channel Setting Pacing Amplitude: 2.5 V
Lead Channel Setting Pacing Pulse Width: 0.4 ms
Lead Channel Setting Sensing Sensitivity: 2.5 mV
Pulse Gen Serial Number: 962270
Zone Setting Status: 755011

## 2023-03-05 NOTE — Progress Notes (Signed)
Remote pacemaker transmission.

## 2023-03-05 NOTE — Addendum Note (Signed)
Addended by: Elease Etienne A on: 03/05/2023 01:16 PM   Modules accepted: Orders

## 2023-05-01 ENCOUNTER — Ambulatory Visit (INDEPENDENT_AMBULATORY_CARE_PROVIDER_SITE_OTHER): Payer: Medicare HMO

## 2023-05-01 DIAGNOSIS — I442 Atrioventricular block, complete: Secondary | ICD-10-CM | POA: Diagnosis not present

## 2023-05-01 LAB — CUP PACEART REMOTE DEVICE CHECK
Battery Remaining Longevity: 126 mo
Battery Remaining Percentage: 100 %
Brady Statistic RA Percent Paced: 6 %
Brady Statistic RV Percent Paced: 100 %
Date Time Interrogation Session: 20250328034100
Implantable Lead Connection Status: 753985
Implantable Lead Connection Status: 753985
Implantable Lead Implant Date: 20211122
Implantable Lead Implant Date: 20211122
Implantable Lead Location: 753859
Implantable Lead Location: 753860
Implantable Lead Model: 7841
Implantable Lead Model: 7842
Implantable Lead Serial Number: 1057413
Implantable Lead Serial Number: 1098402
Implantable Pulse Generator Implant Date: 20211122
Lead Channel Impedance Value: 610 Ohm
Lead Channel Impedance Value: 641 Ohm
Lead Channel Pacing Threshold Amplitude: 0.5 V
Lead Channel Pacing Threshold Amplitude: 0.9 V
Lead Channel Pacing Threshold Pulse Width: 0.4 ms
Lead Channel Pacing Threshold Pulse Width: 0.4 ms
Lead Channel Setting Pacing Amplitude: 2 V
Lead Channel Setting Pacing Amplitude: 2.5 V
Lead Channel Setting Pacing Pulse Width: 0.4 ms
Lead Channel Setting Sensing Sensitivity: 2.5 mV
Pulse Gen Serial Number: 962270
Zone Setting Status: 755011

## 2023-05-08 ENCOUNTER — Encounter: Payer: Self-pay | Admitting: Internal Medicine

## 2023-06-09 NOTE — Progress Notes (Signed)
 Remote pacemaker transmission.

## 2023-07-22 ENCOUNTER — Telehealth: Payer: Self-pay

## 2023-07-22 NOTE — Telephone Encounter (Signed)
 Pt wife called in stating that he has been feeling dizzy like he's going to pass out. They are sending transmission now and would like a call back

## 2023-07-22 NOTE — Telephone Encounter (Signed)
 Reviewed remote transmission with Joey, [employer]. Several false PMT events noted with today's date of 07/22/23.   Several recommendations are advised per Joey. Also reviewed in clinic with Dr. Carolynne Citron who is agreeable to changes.   Increase MTR to 130 bpm. Shorten AV delay. Shorten PVARP to 300 ms (may need to go as low to 250 ms).   Industry was requested to assist.   Spoke to patients wife who reports patient BP was 117/6 (checked within last hour).   Device clinic apt made 07/23/23 @ 10:40 am to make programming changes. Discussed location, date and time.  Patient given ED precautions to if symptoms worsen or syncope event occurs.   Patients wife does report patient started tamsulosin last weekend and she was told it could make patient dizzy. Advised pt/wife to call the provider who wrote the prescription to inform.  Voiced understanding and agreeable to call.

## 2023-07-23 ENCOUNTER — Ambulatory Visit: Attending: Cardiovascular Disease

## 2023-07-23 DIAGNOSIS — I442 Atrioventricular block, complete: Secondary | ICD-10-CM

## 2023-07-23 NOTE — Progress Notes (Signed)
 Patient brought in acutely today to device clinic after he called complaining of weakness, lightheadedness and funny feeling in chest described as palpitations with near syncope in recent weeks while working outside.  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  Events occur during this activity started 1.5 weeks ago.  Patient has also started on Tamsulosin and Duloxetine in past 1-2 weeks as well.    Joey with [employer] present for interrogation and evaluation of recent device flagged PMT episodes which somewhat correlate with symptoms.  PMT events appear to be an atrial tachyarrhythmia that is causing upper rate behavior and possibly causing his symptoms.    Device function normal on check today, sensing, impedances and pacing thresholds all normal with stable trends.  The following changes were made to address the upper rate behavior:  1.  MAX TRACK RATE: increased from 110 to 130bpm. 2.  PAV and SAV decreased from 200 to PAV - , SAV - .  3. Max tracking rate interval :  adjusted from to  Patient encouraged to keep self well hydrated due to working in the heat and keep an eye on his blood pressures.  In addition, patient should follow up with his PCP if symptoms continue after adjustments made today as there could be other underlying reasons for his symptoms, not device related.  Patient verbalizes understanding.

## 2023-07-31 ENCOUNTER — Ambulatory Visit (INDEPENDENT_AMBULATORY_CARE_PROVIDER_SITE_OTHER): Payer: Medicare HMO

## 2023-07-31 DIAGNOSIS — I442 Atrioventricular block, complete: Secondary | ICD-10-CM | POA: Diagnosis not present

## 2023-07-31 LAB — CUP PACEART REMOTE DEVICE CHECK
Battery Remaining Longevity: 114 mo
Battery Remaining Percentage: 100 %
Brady Statistic RA Percent Paced: 2 %
Brady Statistic RV Percent Paced: 100 %
Date Time Interrogation Session: 20250627034100
Implantable Lead Connection Status: 753985
Implantable Lead Connection Status: 753985
Implantable Lead Implant Date: 20211122
Implantable Lead Implant Date: 20211122
Implantable Lead Location: 753859
Implantable Lead Location: 753860
Implantable Lead Model: 7841
Implantable Lead Model: 7842
Implantable Lead Serial Number: 1057413
Implantable Lead Serial Number: 1098402
Implantable Pulse Generator Implant Date: 20211122
Lead Channel Impedance Value: 614 Ohm
Lead Channel Impedance Value: 635 Ohm
Lead Channel Pacing Threshold Amplitude: 0.5 V
Lead Channel Pacing Threshold Amplitude: 0.9 V
Lead Channel Pacing Threshold Pulse Width: 0.4 ms
Lead Channel Pacing Threshold Pulse Width: 0.4 ms
Lead Channel Setting Pacing Amplitude: 2 V
Lead Channel Setting Pacing Amplitude: 2.5 V
Lead Channel Setting Pacing Pulse Width: 0.4 ms
Lead Channel Setting Sensing Sensitivity: 2.5 mV
Pulse Gen Serial Number: 962270
Zone Setting Status: 755011

## 2023-08-03 ENCOUNTER — Ambulatory Visit: Payer: Self-pay | Admitting: Internal Medicine

## 2023-08-06 ENCOUNTER — Encounter: Payer: Self-pay | Admitting: Primary Care

## 2023-08-06 ENCOUNTER — Ambulatory Visit (INDEPENDENT_AMBULATORY_CARE_PROVIDER_SITE_OTHER): Admitting: Primary Care

## 2023-08-06 VITALS — BP 122/62 | HR 78 | Temp 97.4°F | Ht 69.0 in | Wt 176.4 lb

## 2023-08-06 DIAGNOSIS — J84112 Idiopathic pulmonary fibrosis: Secondary | ICD-10-CM

## 2023-08-06 DIAGNOSIS — Z87891 Personal history of nicotine dependence: Secondary | ICD-10-CM

## 2023-08-06 NOTE — Progress Notes (Signed)
 @Patient  ID: Scott Price Brought, male    DOB: 26-Jul-1945, 78 y.o.   MRN: [id number]  Chief Complaint  Patient presents with   Follow-up    IPF f/u     Referring provider: Clinic, [facility]  HPI: 78 year old male, former smoker. PMH significant for HTN, unstable angia, bradycardia, heart block, pacemaker, pulmonary fibrosis. Patient of Dr. Rosalynd.  08/06/2023- Interim hx  Discussed the use of AI scribe software for clinical note transcription with the patient, who gave verbal consent to proceed.  History of Present Illness   Scott Price is a 78 year old male with idiopathic pulmonary fibrosis who presents for follow-up of his condition.  He has idiopathic pulmonary fibrosis and is not currently on any medication for it due to previous intolerances. He discontinued Ofev  because of diarrhea and vomiting and has an allergy  to Esbriet . His breathing remains stable, and he can take deep breaths, although it feels difficult. No significant shortness of breath at rest or during simple tasks such as showering, changing clothes, or eating. No shortness of breath during household tasks or when walking at his own pace. He does not use oxygen and has not experienced any cough, nausea, vomiting, or diarrhea recently.  He has a pacemaker and recently underwent an adjustment due to symptoms of fatigue and dizziness. The adjustment was necessary because the pacemaker was not synchronizing with the atrial rate, leading to symptoms similar to those experienced before the pacemaker was initially placed. Since the adjustment, he reports improved energy levels, although not completely back to normal.  He has a history of neck and shoulder pain due to a plate in his neck from fusion surgery and bone spurs. He has had lower back surgery in the past. He is not currently on Fasenra  for asthma and uses albuterol  as needed, although he reports not having used it recently. No current symptoms of asthma, such as cough  or shortness of breath.  He works part-time at a park, picking up golf balls and mowing.      SYMPTOM SCALE - ILD 12/20/2020 05/30/2021 Esbriet  - rash 08/23/2021 Not on any antifibroic 11/28/2021 Ofev  100mg  bid 03/06/2022 Ofev  100mg  bid 07/03/2022 Ofev  holdiay since 61715 due to diarrha. On fasenral x 8 weeks 08/28/2022 Off ofev , Failed 2nd challeng. On Fasenral since march 203 01/14/2023 Off nintedanib.  Still on Fasenra  but it is not helping him.  PFT stable. 08/06/2023 Off OFEV , off Fasenra ; Overdue for PFTs  Current weight                   O2 use ra ra ra ra ra ra ra ra RA  Shortness of Breath 0 -> 5 scale with 5 being worst (score 6 If unable to do)                 At rest 2 1 2 1 2 3 2 2  0  Simple tasks - showers, clothes change, eating, shaving 3 3 3 2 2 1 2 4  0  Household (dishes, doing bed, laundry) 4 3 3 1 2  0 2 4 0  Shopping 3 3 2 2 2 1 3 3  0  Walking level at own pace 3 4 3 3 3 2 3 4  0  Walking up Stairs Does not do 4 3 3 3 1 3 4  0  Total (30-36) Dyspnea Score 15 18 16 13 14 8 15 21  0  How bad is your cough? 4 4 3  -  worse at night 3 4 2  x 4 0  How bad is your fatigue 4 5 3 4 4 1 3 4  0  How bad is nausea 0 0 0 0 0 0 0 0 0  How bad is vomiting?   0 0 0 0 0 0 0 0 0  How bad is diarrhea? 1 0 0 0 0 0 1 0 0  How bad is anxiety? 5 4 0 0 0 0 4 4 0  How bad is depression 0 1 0 0 0 0 0 2 0  Any chronic pain - if so where and how bad 0   Rt knoee 0 0 0 x z Neck and shoulders  0       Simple office walk 185 feet x  3 laps goal with forehead probe 12/20/2020   03/19/2021   07/03/2022   08/28/2022    O2 used ra ra ra ra  Number laps completed 3 3 Sit stand x 10 Sit stand x 01  Comments about pace 3        Resting Pulse Ox/HR 99% and 78/min 100 and 79 98% and HR 72 97% and HR 66  Final Pulse Ox/HR 97% and 96/min 100% and 90 98% and HR  81 98% and HR 72  Desaturated </= 88% no no no    Desaturated <= 3% points no no no    Got Tachycardic >/= 90/min yes yes no    Symptoms at end of  test Mild dyspnea Mild dyspnea Yes some  yes  Miscellaneous comments x x                Allergies  Allergen Reactions   Doxycycline Shortness Of Breath and Rash   Advair Hfa [Fluticasone -Salmeterol] Other (See Comments)    Thrush and sore throat   Lisinopril  Cough   Ofev  [Nintedanib] Nausea And Vomiting   Amoxicillin Rash   Lasix  [Furosemide ] Rash   Pirfenidone  Rash   Prednisone     High blood sugar   Sulfa Antibiotics Rash    Headache/ Chest tightness/ stiff neck    Immunization History  Administered Date(s) Administered   Fluad Quad(high Dose 65+) 11/18/2021   Influenza Split 11/22/2012, 11/01/2015, 11/06/2016   Influenza, High Dose Seasonal PF 01/31/2011, 12/16/2013, 01/09/2020, 11/15/2020, 11/11/2022   Influenza-Unspecified 01/18/2007, 01/13/2008, 01/12/2009, 11/03/2009, 10/05/2011, 11/03/2012, 12/04/2014, 10/13/2017, 01/13/2019, 11/03/2020, 11/19/2021   Moderna Covid-19 Fall Seasonal Vaccine 48yrs & older 03/19/2022, 11/11/2022   Moderna Covid-19 Vaccine Bivalent Booster 59yrs & up 12/25/2020   Moderna Sars-Covid-2 Vaccination 04/11/2019, 05/09/2019, 01/17/2020, 06/26/2020   PNEUMOCOCCAL CONJUGATE-20 09/24/2020   Pneumococcal Conjugate-13 10/10/2014   Pneumococcal Polysaccharide-23 01/08/2012, 09/24/2020   Pneumococcal-Unspecified 09/13/2006, 01/08/2012   Rsv, Bivalent, Protein Subunit Rsvpref,pf [place]) 04/11/2022   Tdap 10/10/2014   Tetanus Immune Globulin 01/08/2012    Past Medical History:  Diagnosis Date   Diabetes mellitus without complication (HCC)    High cholesterol     Tobacco History: Social History   Tobacco Use  Smoking Status Former   Current packs/day: 0.00   Types: Cigarettes   Quit date: 2000   Years since quitting: 25.5  Smokeless Tobacco Never   Counseling given: Not Answered   Outpatient Medications Prior to Visit  Medication Sig Dispense Refill   albuterol  (VENTOLIN  HFA) 108 (90 Base) MCG/ACT inhaler Inhale 2 puffs into the  lungs every 6 (six) hours as needed for wheezing or shortness of breath. 8 g 6   aspirin  81 MG EC tablet Take  81 mg by mouth daily.      Benralizumab  (FASENRA  PEN) 30 MG/ML SOAJ Inject 1 mL (30 mg total) into the skin every 8 (eight) weeks. MAINTENANCE DOSE 1 mL 1   Benralizumab  (FASENRA  PEN) 30 MG/ML SOAJ Inject 30mg  into the skin at Week 0, Week 4, Week 8. 1 mL 2   Cholecalciferol  (VITAMIN D3) 25 MCG (1000 UT) CAPS Take 1,000 Units by mouth daily.     cyanocobalamin  1000 MCG tablet Take 1,000 mcg by mouth daily.     dextromethorphan-guaiFENesin (MUCINEX DM) 30-600 MG 12hr tablet Take 1 tablet by mouth 2 (two) times daily.     empagliflozin (JARDIANCE) 10 MG TABS tablet Take 5 mg by mouth in the morning and at bedtime.     ferrous sulfate  325 (65 FE) MG tablet Take 325 mg by mouth daily with breakfast.     fexofenadine (ALLEGRA) 180 MG tablet Take 180 mg by mouth daily.     fluticasone -salmeterol (WIXELA INHUB) 250-50 MCG/ACT AEPB Inhale 1 puff into the lungs in the morning and at bedtime. 60 each 11   glipiZIDE  (GLUCOTROL ) 10 MG tablet Take 5 mg by mouth 2 (two) times daily before a meal.     HYDROcodone  bit-homatropine (HYCODAN) 5-1.5 MG/5ML syrup Take 5 mLs by mouth every 12 (twelve) hours as needed for cough (IPF refractory cough). 240 mL 0   losartan  (COZAAR ) 25 MG tablet TAKE 1 TABLET (25 MG TOTAL) BY MOUTH DAILY. 90 tablet 3   magnesium  oxide (MAG-OX) 400 MG tablet Take 400 mg by mouth 2 (two) times daily.     meclizine  (ANTIVERT ) 25 MG tablet Take 25 mg by mouth 3 (three) times daily as needed for dizziness.     meloxicam (MOBIC) 15 MG tablet Take by mouth daily.     metFORMIN  (GLUCOPHAGE ) 1000 MG tablet Take 1 tablet (1,000 mg total) by mouth in the morning and at bedtime.     Multiple Vitamin (MULTI-VITAMIN) tablet Take 1 tablet by mouth daily.     Nintedanib (OFEV ) 100 MG CAPS Take 1 capsule (100 mg total) by mouth 2 (two) times daily. 180 capsule 1   nystatin  (MYCOSTATIN ) 100000  UNIT/ML suspension Take 5 mLs (500,000 Units total) by mouth 4 (four) times daily. 473 mL 1   omeprazole (PRILOSEC) 20 MG capsule Take 20 mg by mouth See admin instructions. Every other night     rosuvastatin  (CRESTOR ) 40 MG tablet Take 40 mg by mouth at bedtime.     traZODone (DESYREL) 50 MG tablet Take by mouth as needed for sleep.     No facility-administered medications prior to visit.   Review of Systems  Review of Systems  Constitutional: Negative.  Negative for fatigue.  HENT: Negative.    Respiratory: Negative.  Negative for cough and shortness of breath.    Physical Exam  BP 122/62 (BP Location: Left Arm, Patient Position: Sitting, Cuff Size: Normal)   Pulse 78   Temp (!) 97.4 F (36.3 C) (Temporal)   Ht 5' 9 (1.753 m)   Wt 176 lb 6.4 oz (80 kg)   SpO2 98%   BMI 26.05 kg/m  Physical Exam Constitutional:      Appearance: Normal appearance.  HENT:     Head: Normocephalic.  Cardiovascular:     Rate and Rhythm: Normal rate and regular rhythm.  Pulmonary:     Effort: Pulmonary effort is normal.     Breath sounds: Rales present. No wheezing or rhonchi.  Musculoskeletal:  General: Normal range of motion.  Skin:    General: Skin is warm and dry.  Neurological:     General: No focal deficit present.     Mental Status: He is alert and oriented to person, place, and time. Mental status is at baseline.  Psychiatric:        Mood and Affect: Mood normal.        Behavior: Behavior normal.        Thought Content: Thought content normal.        Judgment: Judgment normal.      Lab Results:  CBC    Component Value Date/Time   WBC 8.1 11/28/2021 1159   RBC 4.38 11/28/2021 1159   HGB 13.1 11/28/2021 1159   HGB 11.9 (L) 12/21/2019 1004   HCT 40.1 11/28/2021 1159   HCT 36.1 (L) 12/21/2019 1004   PLT 197.0 11/28/2021 1159   PLT 203 12/21/2019 1004   MCV 91.4 11/28/2021 1159   MCV 91 12/21/2019 1004   MCH 30.1 08/23/2021 1543   MCHC 32.7 11/28/2021 1159   RDW  15.1 11/28/2021 1159   RDW 13.9 12/21/2019 1004   LYMPHSABS 1.1 11/28/2021 1159   LYMPHSABS 1.5 12/21/2019 1004   MONOABS 0.7 11/28/2021 1159   EOSABS 0.5 11/28/2021 1159   EOSABS 0.5 (H) 12/21/2019 1004   BASOSABS 0.1 11/28/2021 1159   BASOSABS 0.0 12/21/2019 1004    BMET    Component Value Date/Time   NA 139 11/05/2021 1047   K 4.2 11/05/2021 1047   CL 100 11/05/2021 1047   CO2 22 11/05/2021 1047   GLUCOSE 262 (H) 11/05/2021 1047   GLUCOSE 146 (H) 08/23/2021 1543   BUN 14 11/05/2021 1047   CREATININE 1.05 11/05/2021 1047   CREATININE 1.25 08/23/2021 1543   CALCIUM  9.5 11/05/2021 1047   GFRNONAA 57 (L) 12/14/2019 0941   GFRNONAA >60 11/19/2019 1217   GFRAA 65 12/14/2019 0941    BNP    Component Value Date/Time   BNP 22 08/23/2021 1543    ProBNP No results found for: PROBNP  Imaging: CUP PACEART REMOTE DEVICE CHECK Result Date: 07/31/2023 PPM scheduled remote reviewed. Normal device function.  Presenting rhythm: AS-VP Next remote 91 days. AB, CVRS    Assessment & Plan:    1. IPF (idiopathic pulmonary fibrosis) (HCC) (Primary) - Pulmonary Function Test; Future  Assessment and Plan    Idiopathic Pulmonary Fibrosis Idiopathic Pulmonary Fibrosis (IPF) is well-managed over the last 18 months with no significant changes in respiratory symptoms. Previous trials of Ofev  were unsuccessful due to diarrhea and vomiting, and there is intolerance to Esbriet . Research options are being considered, especially if there is disease progression. - Schedule pulmonary function test (PFT) to assess current status of IPF. - Consider research opportunities for IPF treatment if there is disease progression based on PFT results. - Remove Ofev  from medication list.  Atrial Tachycardia with Pacemaker Adjustment Recent pacemaker adjustment was performed to address atrial tachycardia, resulting in significant improvement in symptoms, including increased energy levels and reduced  fatigue.  Chronic Neck and Shoulder Pain Chronic neck and shoulder pain persists, likely related to previous fusion surgery and the presence of bone spurs in the neck. Surgical intervention is not currently an option due to caregiving responsibilities for his wife.   Almarie LELON Ferrari, NP 08/06/2023

## 2023-08-06 NOTE — Patient Instructions (Addendum)
-  IDIOPATHIC PULMONARY FIBROSIS: Idiopathic Pulmonary Fibrosis (IPF) is a lung condition that causes scarring of the lungs, making it difficult to breathe. Your condition has been stable over the last 18 months. We will schedule a pulmonary function test (PFT) to assess your current status. If there is any disease progression, we will consider research opportunities for treatment. We have removed Ofev  from your medication list due to previous intolerances.  Orders: PFTs  Sit stand x 15  Follow-up First available- 30 min spirometry with DLCO  November with Dr. Geronimo- 30 mins

## 2023-08-26 ENCOUNTER — Ambulatory Visit: Attending: Cardiovascular Disease | Admitting: Internal Medicine

## 2023-08-26 VITALS — BP 104/58 | HR 75 | Ht 69.5 in | Wt 174.2 lb

## 2023-08-26 DIAGNOSIS — I10 Essential (primary) hypertension: Secondary | ICD-10-CM

## 2023-08-26 DIAGNOSIS — R001 Bradycardia, unspecified: Secondary | ICD-10-CM

## 2023-08-26 DIAGNOSIS — I442 Atrioventricular block, complete: Secondary | ICD-10-CM

## 2023-08-26 LAB — CUP PACEART INCLINIC DEVICE CHECK
Date Time Interrogation Session: 20250723143912
Implantable Lead Connection Status: 753985
Implantable Lead Connection Status: 753985
Implantable Lead Implant Date: 20211122
Implantable Lead Implant Date: 20211122
Implantable Lead Location: 753859
Implantable Lead Location: 753860
Implantable Lead Model: 7841
Implantable Lead Model: 7842
Implantable Lead Serial Number: 1057413
Implantable Lead Serial Number: 1098402
Implantable Pulse Generator Implant Date: 20211122
Lead Channel Impedance Value: 652 Ohm
Lead Channel Impedance Value: 659 Ohm
Lead Channel Pacing Threshold Amplitude: 0.7 V
Lead Channel Pacing Threshold Amplitude: 0.7 V
Lead Channel Pacing Threshold Pulse Width: 0.4 ms
Lead Channel Pacing Threshold Pulse Width: 0.4 ms
Lead Channel Sensing Intrinsic Amplitude: 20.5 mV
Lead Channel Sensing Intrinsic Amplitude: 5.6 mV
Lead Channel Setting Pacing Amplitude: 2 V
Lead Channel Setting Pacing Amplitude: 2.5 V
Lead Channel Setting Pacing Pulse Width: 0.4 ms
Lead Channel Setting Sensing Sensitivity: 2.5 mV
Pulse Gen Serial Number: 962270
Zone Setting Status: 755011

## 2023-08-26 NOTE — Progress Notes (Signed)
 [name] returns today for ongoing evaluation of [place] attacks, s/p PPM Insertion.SABRA He wore a cardiac monitor which demonstrates transient CHB with these spells lasting up to 7 seconds with only P waves. He has not actually passed out and he is not driving. He denies chest pain or edema. He underwent left heart cath and has non-obstructive CAD and preserved LV function.  He had some problems with PM WB. His upper rate was increased to 130 and his symptoms resolved.  Allergies  Allergen Reactions   Doxycycline Rash, Shortness Of Breath, Dermatitis and Nausea And Vomiting   Pirfenidone  Rash and Itching    Other Reaction(s): Lip swelling, Itching, Erythema, Lip swelling, Itching, Erythema   Advair Hfa [Fluticasone -Salmeterol] Other (See Comments)    Thrush and sore throat   Gabapentin Nausea And Vomiting   Lisinopril  Cough   Ofev  [Nintedanib] Nausea And Vomiting   Amoxicillin Rash   Furosemide  Rash and Dermatitis   Prednisone     High blood sugar   Sulfa Antibiotics Rash    Headache/ Chest tightness/ stiff neck     Current Outpatient Medications  Medication Sig Dispense Refill   albuterol  (VENTOLIN  HFA) 108 (90 Base) MCG/ACT inhaler Inhale 2 puffs into the lungs every 6 (six) hours as needed for wheezing or shortness of breath. 8 g 6   aspirin  81 MG EC tablet Take 81 mg by mouth daily.      Cholecalciferol  (VITAMIN D3) 25 MCG (1000 UT) CAPS Take 1,000 Units by mouth daily.     cyanocobalamin  1000 MCG tablet Take 1,000 mcg by mouth daily.     dextromethorphan-guaiFENesin (MUCINEX DM) 30-600 MG 12hr tablet Take 1 tablet by mouth 2 (two) times daily.     empagliflozin (JARDIANCE) 10 MG TABS tablet Take 5 mg by mouth in the morning and at bedtime.     ferrous sulfate  325 (65 FE) MG tablet Take 325 mg by mouth daily with breakfast.     fexofenadine (ALLEGRA) 180 MG tablet Take 180 mg by mouth daily.     glipiZIDE  (GLUCOTROL ) 10 MG tablet Take 5 mg by mouth 2 (two) times  daily before a meal.     losartan  (COZAAR ) 25 MG tablet TAKE 1 TABLET (25 MG TOTAL) BY MOUTH DAILY. 90 tablet 3   magnesium  oxide (MAG-OX) 400 MG tablet Take 400 mg by mouth 2 (two) times daily.     meloxicam (MOBIC) 15 MG tablet Take by mouth daily.     metFORMIN  (GLUCOPHAGE ) 1000 MG tablet Take 1 tablet (1,000 mg total) by mouth in the morning and at bedtime.     Multiple Vitamin (MULTI-VITAMIN) tablet Take 1 tablet by mouth daily.     omeprazole (PRILOSEC) 20 MG capsule Take 20 mg by mouth See admin instructions. Every other night     rosuvastatin  (CRESTOR ) 40 MG tablet Take 40 mg by mouth at bedtime.     traZODone (DESYREL) 50 MG tablet Take 50 mg by mouth at bedtime.     Benralizumab  (FASENRA  PEN) 30 MG/ML SOAJ Inject 1 mL (30 mg total) into the skin every 8 (eight) weeks. MAINTENANCE DOSE 1 mL 1   Benralizumab  (FASENRA  PEN) 30 MG/ML SOAJ Inject 30mg  into the skin at Week 0, Week 4, Week 8. 1 mL 2   empagliflozin (JARDIANCE) 25 MG TABS tablet Take 12.5 mg by mouth 2 (two) times daily.     fluticasone -salmeterol (WIXELA INHUB) 250-50 MCG/ACT AEPB Inhale 1 puff into the lungs  in the morning and at bedtime. 60 each 11   HYDROcodone  bit-homatropine (HYCODAN) 5-1.5 MG/5ML syrup Take 5 mLs by mouth every 12 (twelve) hours as needed for cough (IPF refractory cough). 240 mL 0   meclizine  (ANTIVERT ) 25 MG tablet Take 25 mg by mouth 3 (three) times daily as needed for dizziness. (Patient not taking: Reported on 08/26/2023)     nystatin  (MYCOSTATIN ) 100000 UNIT/ML suspension Take 5 mLs (500,000 Units total) by mouth 4 (four) times daily. 473 mL 1   traZODone (DESYREL) 50 MG tablet Take by mouth as needed for sleep.     No current facility-administered medications for this visit.     Past Medical History:  Diagnosis Date   Diabetes mellitus without complication (HCC)    High cholesterol     ROS:   All systems reviewed and negative except as noted in the HPI.   Past Surgical History:   Procedure Laterality Date   BACK SURGERY     LEFT HEART CATH AND CORONARY ANGIOGRAPHY N/A 11/22/2019   Procedure: LEFT HEART CATH AND CORONARY ANGIOGRAPHY;  Surgeon: Dann Candyce RAMAN, MD;  Location: [facility] INVASIVE CV LAB;  Service: Cardiovascular;  Laterality: N/A;   NECK SURGERY     PACEMAKER IMPLANT N/A 12/26/2019   Procedure: PACEMAKER IMPLANT;  Surgeon: Waddell Danelle ORN, MD;  Location: [facility] INVASIVE CV LAB;  Service: Cardiovascular;  Laterality: N/A;     Family History  Problem Relation Age of Onset   CAD Brother    CAD Brother      Social History   Socioeconomic History   Marital status: Married    Spouse name: Not on file   Number of children: Not on file   Years of education: Not on file   Highest education level: Not on file  Occupational History   Not on file  Tobacco Use   Smoking status: Former    Current packs/day: 0.00    Types: Cigarettes    Quit date: 2000    Years since quitting: 25.5   Smokeless tobacco: Never  Substance and Sexual Activity   Alcohol use: Not Currently   Drug use: Yes    Types: Marijuana   Sexual activity: Not on file  Other Topics Concern   Not on file  Social History Narrative   Not on file   Social Drivers of Health   Financial Resource Strain: Not on file  Food Insecurity: Not on file  Transportation Needs: Not on file  Physical Activity: Not on file  Stress: Not on file  Social Connections: Unknown (11/20/2022)   [facility]   Social Network    Social Network: Not on file  Intimate Partner Violence: Unknown (11/20/2022)   Received from [facility]   HITS    Physically Hurt: Not on file    Insult or Talk Down To: Not on file    Threaten Physical Harm: Not on file    Scream or Curse: Not on file     BP (!) 104/58 (BP Location: Left Arm, Cuff Size: Normal)   Pulse 75   Ht 5' 9.5 (1.765 m)   Wt 174 lb 3.2 oz (79 kg)   SpO2 98%   BMI 25.36 kg/m   Physical Exam:  Well appearing 78 yo man,  NAD HEENT: Unremarkable Neck:  No JVD, no thyromegally Lymphatics:  No adenopathy Back:  No CVA tenderness Lungs:  Clear with no wheezes HEART:  Regular rate rhythm, no murmurs, no rubs, no clicks  Abd:  soft, positive bowel sounds, no organomegally, no rebound, no guarding Ext:  2 plus pulses, no edema, no cyanosis, no clubbing Skin:  No rashes no nodules Neuro:  CN II through XII intact, motor grossly intact  EKG - NSR with p synch ventricular pacing  DEVICE  Normal device function.  See PaceArt for details.   A/P  1. 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  2. PPM - his [place] DDD PM is working normally. We will recheck in several months. We increased the PVARP to try and eliminate PM WB.  3. Chest pressure - resolved, after PPM. Follow. 4. HTN - his bp is well controlled. No change.   Danelle Waddell COME Assess/Plan:

## 2023-08-26 NOTE — Patient Instructions (Signed)

## 2023-10-03 ENCOUNTER — Other Ambulatory Visit: Payer: Self-pay | Admitting: Internal Medicine

## 2023-10-08 NOTE — Progress Notes (Signed)
 Remote pacemaker transmission.

## 2023-10-27 ENCOUNTER — Other Ambulatory Visit (HOSPITAL_COMMUNITY): Payer: Self-pay | Admitting: Orthopedic Surgery

## 2023-10-27 DIAGNOSIS — M25512 Pain in left shoulder: Secondary | ICD-10-CM

## 2023-10-30 ENCOUNTER — Ambulatory Visit (INDEPENDENT_AMBULATORY_CARE_PROVIDER_SITE_OTHER): Payer: Medicare HMO

## 2023-10-30 DIAGNOSIS — I442 Atrioventricular block, complete: Secondary | ICD-10-CM

## 2023-11-02 ENCOUNTER — Ambulatory Visit: Payer: Self-pay | Admitting: Internal Medicine

## 2023-11-02 LAB — CUP PACEART REMOTE DEVICE CHECK
Battery Remaining Longevity: 114 mo
Battery Remaining Percentage: 100 %
Brady Statistic RA Percent Paced: 0 %
Brady Statistic RV Percent Paced: 100 %
Date Time Interrogation Session: 20250926034100
Implantable Lead Connection Status: 753985
Implantable Lead Connection Status: 753985
Implantable Lead Implant Date: 20211122
Implantable Lead Implant Date: 20211122
Implantable Lead Location: 753859
Implantable Lead Location: 753860
Implantable Lead Model: 7841
Implantable Lead Model: 7842
Implantable Lead Serial Number: 1057413
Implantable Lead Serial Number: 1098402
Implantable Pulse Generator Implant Date: 20211122
Lead Channel Impedance Value: 632 Ohm
Lead Channel Impedance Value: 659 Ohm
Lead Channel Pacing Threshold Amplitude: 0.5 V
Lead Channel Pacing Threshold Amplitude: 0.9 V
Lead Channel Pacing Threshold Pulse Width: 0.4 ms
Lead Channel Pacing Threshold Pulse Width: 0.4 ms
Lead Channel Setting Pacing Amplitude: 2 V
Lead Channel Setting Pacing Amplitude: 2.5 V
Lead Channel Setting Pacing Pulse Width: 0.4 ms
Lead Channel Setting Sensing Sensitivity: 2.5 mV
Pulse Gen Serial Number: 962270
Zone Setting Status: 755011

## 2023-11-04 NOTE — Progress Notes (Signed)
 Remote PPM Transmission

## 2023-11-25 ENCOUNTER — Other Ambulatory Visit (HOSPITAL_COMMUNITY): Payer: Self-pay | Admitting: Physician Assistant

## 2023-11-25 DIAGNOSIS — M5412 Radiculopathy, cervical region: Secondary | ICD-10-CM

## 2023-11-27 NOTE — CV Procedure (Signed)
  Device system confirmed to be MRI conditional, with implant date > 6 weeks ago, and no evidence of abandoned or epicardial leads in review of most recent CXR  Device last cleared by EP Provider: Prentice Passey 11/27/23  Clearance is good through for 1 year as long as parameters remain stable at time of check. If pt undergoes a cardiac device procedure during that time, they should be re-cleared.   Tachy-therapies to be programmed off if applicable with device back to pre-MRI settings after completion of exam.  [employer] was available remotely to assist in programming recommendations.   Rocky Catalan, RT  11/27/2023 8:05 AM

## 2023-12-02 ENCOUNTER — Encounter (HOSPITAL_COMMUNITY): Payer: Self-pay

## 2023-12-02 ENCOUNTER — Ambulatory Visit (HOSPITAL_COMMUNITY)
Admission: RE | Admit: 2023-12-02 | Discharge: 2023-12-02 | Disposition: A | Discharge status: Home / Self Care | Source: Ambulatory Visit | Attending: Orthopedic Surgery | Admitting: Orthopedic Surgery

## 2023-12-02 ENCOUNTER — Ambulatory Visit (HOSPITAL_COMMUNITY)
Admission: RE | Admit: 2023-12-02 | Discharge: 2023-12-02 | Disposition: A | Discharge status: Home / Self Care | Source: Ambulatory Visit | Attending: Physician Assistant | Admitting: Physician Assistant

## 2023-12-02 DIAGNOSIS — M25512 Pain in left shoulder: Secondary | ICD-10-CM | POA: Insufficient documentation

## 2023-12-02 DIAGNOSIS — M5412 Radiculopathy, cervical region: Secondary | ICD-10-CM | POA: Insufficient documentation

## 2023-12-02 NOTE — Progress Notes (Signed)
 Patient was monitored by this RN during MRI scan due to presence of a pacemaker. Cardiac rhythm was continuously monitored throughout the procedure. Prior to the start of the scan, the pacemaker was placed in MRI-safe mode by the MRI technician and/or pacemaker representative per [facility]. Following the completion of the scan, the device was returned to its pre-MRI settings. Neurological status and orientation post-procedure were unchanged from baseline.   Pre-procedure Heart Rate (Prior to being placed in MRI safe mode):75 bpm Post-procedure Heart Rate (Once pacemaker is returned to baseline mode): 75 bpm

## 2023-12-02 NOTE — Progress Notes (Signed)
 Patient was monitored by this RN during MRI scan due to presence of a pacemaker. Cardiac rhythm was continuously monitored throughout the procedure. Prior to the start of the scan, the pacemaker was placed in MRI-safe mode by the MRI technician   Pre-procedure Heart Rate (Prior to being placed in MRI safe mode): 75 bpm  Scan continuing and hand off given to [place], CHARITY FUNDRAISER

## 2023-12-04 ENCOUNTER — Ambulatory Visit (INDEPENDENT_AMBULATORY_CARE_PROVIDER_SITE_OTHER)

## 2023-12-04 ENCOUNTER — Ambulatory Visit: Admitting: Internal Medicine

## 2023-12-04 ENCOUNTER — Encounter: Payer: Self-pay | Admitting: Internal Medicine

## 2023-12-04 VITALS — BP 116/64 | HR 74 | Temp 98.9°F | Ht 69.0 in | Wt 167.6 lb

## 2023-12-04 DIAGNOSIS — R630 Anorexia: Secondary | ICD-10-CM

## 2023-12-04 DIAGNOSIS — J84112 Idiopathic pulmonary fibrosis: Secondary | ICD-10-CM

## 2023-12-04 DIAGNOSIS — R0609 Other forms of dyspnea: Secondary | ICD-10-CM | POA: Diagnosis not present

## 2023-12-04 DIAGNOSIS — R634 Abnormal weight loss: Secondary | ICD-10-CM | POA: Diagnosis not present

## 2023-12-04 DIAGNOSIS — K219 Gastro-esophageal reflux disease without esophagitis: Secondary | ICD-10-CM

## 2023-12-04 LAB — CBC WITH DIFFERENTIAL/PLATELET
Basophils Absolute: 0 K/uL (ref 0.0–0.1)
Basophils Relative: 0.4 % (ref 0.0–3.0)
Eosinophils Absolute: 0.5 K/uL (ref 0.0–0.7)
Eosinophils Relative: 6.6 % — ABNORMAL HIGH (ref 0.0–5.0)
HCT: 40.1 % (ref 39.0–52.0)
Hemoglobin: 13.6 g/dL (ref 13.0–17.0)
Lymphocytes Relative: 14.6 % (ref 12.0–46.0)
Lymphs Abs: 1.2 K/uL (ref 0.7–4.0)
MCHC: 33.9 g/dL (ref 30.0–36.0)
MCV: 90.1 fl (ref 78.0–100.0)
Monocytes Absolute: 0.8 K/uL (ref 0.1–1.0)
Monocytes Relative: 9.7 % (ref 3.0–12.0)
Neutro Abs: 5.5 K/uL (ref 1.4–7.7)
Neutrophils Relative %: 68.7 % (ref 43.0–77.0)
Platelets: 203 K/uL (ref 150.0–400.0)
RBC: 4.45 Mil/uL (ref 4.22–5.81)
RDW: 14.4 % (ref 11.5–15.5)
WBC: 8 K/uL (ref 4.0–10.5)

## 2023-12-04 LAB — PULMONARY FUNCTION TEST
DL/VA % pred: 82 %
DL/VA: 3.23 ml/min/mmHg/L
DLCO unc % pred: 82 %
DLCO unc: 19.57 ml/min/mmHg
FEF 25-75 Pre: 3.31 L/s
FEF2575-%Pred-Pre: 171 %
FEV1-%Pred-Pre: 122 %
FEV1-Pre: 3.42 L
FEV1FVC-%Pred-Pre: 109 %
FEV6-%Pred-Pre: 118 %
FEV6-Pre: 4.32 L
FEV6FVC-%Pred-Pre: 106 %
FVC-%Pred-Pre: 111 %
FVC-Pre: 4.35 L
Pre FEV1/FVC ratio: 79 %
Pre FEV6/FVC Ratio: 99 %

## 2023-12-04 LAB — COMPREHENSIVE METABOLIC PANEL WITH GFR
ALT: 29 U/L (ref 0–53)
AST: 27 U/L (ref 0–37)
Albumin: 4.7 g/dL (ref 3.5–5.2)
Alkaline Phosphatase: 58 U/L (ref 39–117)
BUN: 15 mg/dL (ref 6–23)
CO2: 28 meq/L (ref 19–32)
Calcium: 9.7 mg/dL (ref 8.4–10.5)
Chloride: 101 meq/L (ref 96–112)
Creatinine, Ser: 1.05 mg/dL (ref 0.40–1.50)
GFR: 67.64 mL/min (ref >60.00)
Glucose, Bld: 124 mg/dL — ABNORMAL HIGH (ref 70–99)
Potassium: 4 meq/L (ref 3.5–5.1)
Sodium: 139 meq/L (ref 135–145)
Total Bilirubin: 0.5 mg/dL (ref 0.2–1.2)
Total Protein: 7.1 g/dL (ref 6.0–8.3)

## 2023-12-04 LAB — BRAIN NATRIURETIC PEPTIDE: Pro B Natriuretic peptide (BNP): 9 pg/mL (ref 0.0–100.0)

## 2023-12-04 NOTE — Progress Notes (Signed)
 IOV august 2022 for SOB by Clinic, Scott Price  Subjective:   [name] GENDER: male DOB: 04/21/1945, MRN: [id number]  Chief Complaint  Patient presents with   Consult    Pt is being referred due to chronic cough.  Pt states that he does have complaints of chest tightness and SOB with activities.      PMH DMII, high cholesterol. He has recurrent issues with chest tightness. Dry cough, usually non-productive. Occasionally in the morning her has productive cough. He has gerd and takes a PPI. If he does any activity it makes the episodes worse. Hot shower and activity makes it worse. No seasonal changes that he notices. He does have sinus issues at times. Retired at a programme researcher, broadcasting/film/video. Was in [employer], [place] 1966 - 1971. No pets in the house. PFTS at [facility] ratio normal, FEV1 2L, normal dlco, normal tlc. He stays active and still working part time. He has used albuterol  in the past prior to getting his pacemaker.   CT scan of the chest was completed in November 2021 which revealed lower lobe honeycombing and early evidence of interstitial lung disease. xxxxxx  OV 11/23/2020: here to day for follow up. Unfortunately he did not have his CT Chest complete. It was set up through the [place] and he was not prepared to pay for it. His cough is better. His chest tightness is better as well.  Has been using his inhalers.  OV 12/20/2020 -transfer of care to the ILD center to Dr. Geronimo.  Referred by Dr. Adine Price  Subjective:  [name], male , DOB: Jun 22, 1945 , age 78 y.o. , MRN: [id number] , ADDRESS: [address] [name] PCP Clinic, Scott Price Patient Care Team: Clinic, Scott Price as PCP - General Scott Vinie BROCKS, MD as PCP - Cardiology (Cardiology)  This Provider for this visit: Treatment Team:  Attending Provider: Geronimo Amel, MD    12/20/2020 -   Chief Complaint  Patient presents with   Follow-up    Chest  tightness     [name] 78 y.o. -seen in August 2022 in October 2022 by Dr. Gift.  He is here with his wife.  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  Therefore he is seeing us .  He reports insidious onset of shortness of breath at least a year.  This started around the time he had his pacemaker.  The pacemaker did get rid of his syncope but not the shortness of breath.  Is present on exertion relieved by rest.  Recently Dr. Gift started Breo and this is helped his chest tightness on exertion but he still continues have significant shortness of breath with exertion and cough.  Documented below.  He is aware that he has pulmonary fibrosis.  His symptoms are somewhat progressive.  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  Shared with the family his results.  Review of the records indicate that he has not had serology so far.  An ILD questionnaire packet was mailed out to him but he has not received it yet so we do not have the data.  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  His other medications include Breo and omeprazole.  He has a history of diverticulitis.  No coronary artery disease but he has a pacemaker.  He is not on anticoagulation he is  on aspirin  only.  His walking desaturation test shows adequate oxygenation with minimal pulse ox drop.   [facility] Comprehensive ILD Questionnaire -dictated on 03/19/2021  Symptoms:  x    Past Medical History :  -Denies any asthma or COPD or heart failure.  Denies collagen vascular disease..  Denies sleep apnea -Does have acid reflux -Does have diabetes.  Intolerant to prednisone.  Makes him very hyperglycemic -Intolerant to gabapentin -Never had COVID   ROS:  -Does have exertional fatigue -10 pound weight loss prior to the November 2022 consult -Dry eyes present -Does have occasional acid reflux which is fairly well controlled with  omeprazole  FAMILY HISTORY of LUNG DISEASE:  -Mom had asthma  PERSONAL EXPOSURE HISTORY:  -Smoke cigarettes stop smoking 23 years ago.  Never did vaping in the past smoked some amount of marijuana but not much.  No cocaine use no intravenous drug use  HOME  EXPOSURE and HOBBY DETAILS :  -[place] in the urban house [place] 1997.  The house was 79 years old.  No dampness no mold or mildew.  Does not use CPAP.  Extensive organic antigen exposure history is negative  OCCUPATIONAL HISTORY (122 questions) : -He is a veteran but no agent orange exposure.  Has done gardening.  Has done woodwork.  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  Has done a garage repair.  Has done furniture work.  Has done some car cabin crew work.  Has done machine operating.  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  Intolerant to prednisone. -For postnasal drip has tried nasal spray saline and steroid nasal spray and it does not work  INVESTIGATIONS: -Below   CT Chest data - HRCT 11/27/20  Study Result  Narrative & Impression  CLINICAL DATA:  78 year old male with history of interstitial lung disease. Follow-up study.   EXAM: CT CHEST WITHOUT CONTRAST   TECHNIQUE: Multidetector CT imaging of the chest was performed following the standard protocol without intravenous contrast. High resolution imaging of the lungs, as well as inspiratory and expiratory imaging, was performed.   COMPARISON:  Chest CT 12/19/2019.   FINDINGS: Cardiovascular: Heart size is normal. There is no significant pericardial fluid, thickening or pericardial calcification. There is aortic atherosclerosis, as well as atherosclerosis of the great vessels of the mediastinum and the coronary arteries, including calcified atherosclerotic plaque in the left main, left anterior descending, left circumflex and right coronary  arteries. Severe calcifications of the aortic valve and mitral annulus. Left-sided pacemaker device in place with lead tips terminating in the right atrial appendage and right ventricular apex.   Mediastinum/Nodes: No pathologically enlarged mediastinal or hilar lymph nodes. Please note that accurate exclusion of hilar adenopathy is limited on noncontrast CT scans. Esophagus is unremarkable in appearance. No axillary lymphadenopathy.   Lungs/Pleura: High-resolution images demonstrate widespread but patchy areas of peripheral predominant ground-glass attenuation, septal thickening, subpleural reticulation, cylindrical bronchiectasis and peripheral bronchiolectasis. Findings appear progressive compared to the prior study. Findings have a definitive craniocaudal gradient. No frank honeycombing is confidently identified at this time. Inspiratory and expiratory imaging demonstrates some very mild air trapping in the lung bases indicative of mild small airways disease. No acute consolidative airspace disease. No pleural effusions. No suspicious appearing pulmonary nodules or masses are noted.   Upper Abdomen: Aortic atherosclerosis.   Musculoskeletal: Orthopedic fixation hardware in the lower cervical spine incidentally noted. There  are no aggressive appearing lytic or blastic lesions noted in the visualized portions of the skeleton.   IMPRESSION: 1. Progressively worsening interstitial lung disease, 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 2. Aortic atherosclerosis, in addition to left main and 3 vessel coronary artery disease. Assessment for potential risk factor modification, dietary therapy or pharmacologic therapy may be warranted, if clinically indicated. 3. There are calcifications of the aortic valve and mitral annulus. Echocardiographic correlation for evaluation of potential valvular dysfunction may be  warranted if clinically indicated.   Aortic Atherosclerosis (ICD10-I70.0).     Electronically Signed   By: Toribio Aye M.D.   On: 11/28/2020 12:46    No results found.  Cardoiac cath  oct 2021 Mid RCA lesion is 25% stenosed. Prox Cx to Mid Cx lesion is 25% stenosed. Prox LAD to Mid LAD lesion is 25% stenosed. The left ventricular systolic function is normal. LV end diastolic pressure is normal. The left ventricular ejection fraction is 55-65% by visual estimate. There is no aortic valve stenosis.   Nonobstructive CAD.  Continue preventive therapy  PFT  No flowsheet data found.   Latest Reference Range & Units 11/19/19 12:17  Creatinine 0.61 - 1.24 mg/dL 8.99    Latest Reference Range & Units 11/19/19 12:17  Hemoglobin 13.0 - 17.0 g/dL 85.8       OV 09/06/7974  Subjective:  [name], male , DOB: [birth year] , age 23 y.o. , MRN: [id number] , ADDRESS: [address] [name] PCP Clinic, Scott Price Patient Care Team: Clinic, Scott Price as PCP - General Scott Vinie BROCKS, MD as PCP - Cardiology (Cardiology)  This Provider for this visit: Treatment Team:  Attending Provider: Geronimo Amel, MD  Type of visit:Video Circumstance: COVID-19 national emergency Identification of patient Scott Price with [birth year] and MRN [id number] - 2 person identifier Risks: Risks, benefits, limitations of telephone visit explained. Patient understood and verbalized agreement to proceed Anyone else on call: his wife on side Patient location: [address] [name] This provider location: [address]; [place]; [place] [address]. [facility]. [phone]    02/08/2021 - clinical IPF followup - to assess esbriet  uptake    [name] 78 y.o. - only on 2nd week of esbreit. On 2 pills tid. Sunday 02/10/21 ends 2pills tid. On Monday 02/11/21 will go to 3 pills tid. No side effects. Spacing 5-6h apart .  Does not wear sunscreen bu wears hat and long sleeve clothes. Does have sunscren. Reviewd serology and cbc/chmeistry - these are normal  However, reports chest congestion. Hacks and coughs. Sputum +. Says it is baseline few months ago. Rates  it as 4-5 of 5.Does not know sputum color - but thinks it is clear. Not wheezing. Clears throat. fEels he says he cannot take prednisone - even 5 days - drives him hyperglucemic 400s  AUTOIMMUNE 12/20/20   Latest Reference Range & Units 11/19/19 12:17 12/14/19 09:41 04/27/20 15:59 12/20/20 15:13  Sed Rate 0 - 20 mm/hr    7  Glucose 70 - 99 mg/dL 843 (H) 792 (H) 842 (H) 166 (H)  Anti Nuclear Antibody (ANA) NEGATIVE     NEGATIVE  Angiotensin-Converting Enzyme 9 - 67 U/L    20  ds DNA Ab IU/mL    <1  RA Latex Turbid. <14 IU/mL    <14  SSA (Ro) (ENA) Antibody,  IgG <1.0 NEG AI    <1.0 NEG  SSB (La) (ENA) Antibody, IgG <1.0 NEG AI    <1.0 NEG  Scleroderma (Scl-70) (ENA) Antibody, IgG <1.0 NEG AI    <1.0 NEG  QUANTIFERON-TB GOLD PLUS     Rpt  (H): Data is abnormally high Rpt: View report in Results Review for more information   PFT  No flowsheet data found  OV 03/19/2021  Subjective:  [name], male , DOB: 01/01/1946 , age 26 y.o. , MRN: [id number] , ADDRESS: [address] [place] [address] PCP Clinic, Scott Price Patient Care Team: Clinic, Scott Price as PCP - General Scott Vinie BROCKS, MD as PCP - Cardiology (Cardiology)  This Provider for this visit: Treatment Team:  Attending Provider: Geronimo Amel, MD    03/19/2021 -   Chief Complaint  Patient presents with   Follow-up    Pt states he has been doing okay since last visit. States he does still become SOB with exertion and still has complaints of coughing that is worse in the mornings.   Diagnois is based on age > 82, ex smoker, progressive disease, probable UIP pattern on CT., negative serolgy and ILD qquewtionaire and acid reflux  -Start pirfenidone   around Christmas 2022 -Significant out of proportion cough  [name] 78 y.o. -returns for follow-up.  I reviewed his ILD questionnaire and his exposures consistent with IPF.  Currently he is on pirfenidone .  He is now on 3 pills 3 times daily for at least a month.  He says ever since going to 3 pills 3 times daily he has occasional mild acid reflux which his wife also test.  He feels a heartburn.  It is mild and tolerable.  He does take omeprazole but he is taking it with food as opposed to empty stomach.  There is no nausea.  No vomiting no diarrhea.  He feels his weight is stable.  Overall he is pleased with his Esbriet  tolerance.  His chronic symptoms of shortness of breath and cough are stable although the cough is the most significant of symptoms.  He rates it as a 3 out of 5.  It is worse early in the morning than the rest of the day.  There is clear sputum.  Last visit I prescribed Robitussin which is helping him.  I also suggested he take Tessalon  but he he is unable to afford a $66 co-pay.  We discussed gabapentin but this makes him sleepy from a previous neuropathic experience.  We discussed prednisone but this makes him hyperglycemic he does not want to do it.  We discussed opioids but he does not want to do that either.  He is on chronic Afrin nasal spray.  We discussed taking saline nasal spray or nasal steroids for his sinus drainage but he feels those things do not help.  Nevertheless he is willing to try it.  He is going to have a liver function test today  We discussed participation in clinical trials and he is interested in the future. y    No results found.    PFT  No flowsheet data found.    OV 05/30/2021  Subjective:  [name], male , DOB: 08-18-1945 , age 51 y.o. , MRN: [id number] , ADDRESS: [address] [place] [address] PCP Clinic, Scott Price Patient Care Team: Clinic, Scott Price as PCP - General Scott Vinie BROCKS, MD as PCP -  Cardiology (Cardiology)  This Provider for this visit: Treatment Team:  Attending Provider: Geronimo Amel, MD    05/30/2021 -   Chief Complaint  Patient presents with   Follow-up    PFT performed today.  Pt states that he began breaking out on his face about 2 weeks ago. States his breathing has been about the same.     [name] 78 y.o. -returns for follow-up.  He is here with his wife.  He is a little bit upset because of running 30 minutes behind.  Apologize for this delay.  He tells me respiratory wise he stable.  He had pulmonary function test that looks for fairly robust.  However in the last 3 weeks he has had rash in his face.  This whole face is red.  He also some increased erythema in his bilateral forearms.  These are sun exposed areas.  He does admit to applying sunscreen but he is on pirfenidone  at the same time.  2 weeks ago he ended up in the emergency department on 05/18/2021 and was given valacyclovir for blistering in the lips but this has not helped.  He is applying Chapstick.  He is frustrated by this.  Also simultaneously since starting pirfenidone  he has had altered taste with food.   We did discuss potential steroids for his rash but he wants to follow with his primary dermatologist through the [facility].  He is also worried about steroids because of its negative effect with his blood sugars.     OV 08/23/2021  Subjective:  [name], male , DOB: 05-Mar-1945 , age 14 y.o. , MRN: [id number] , AD[address]ESS: [address] [place] [address] PCP Clinic, Scott Price Patient Care Team: Clinic, Scott Price as PCP - General Scott Vinie BROCKS, MD as PCP - Cardiology (Cardiology)  This Provider for this visit: Treatment Team:  Attending Provider: Geronimo Amel, MD    08/23/2021 -   Chief Complaint  Patient presents with   Follow-up    Follow-up   [name] 78 y.o. -returns for follow-up.  He is no longer on pirfenidone  after  stopping pirfenidone  in April 2023 his dysgeusia resolved.  Rash also is resolved itching also resolved.  He did see nurse practitioner in May 2023 and by this time his rash was resolving.  Currently is not on any antifibrotic.  Overall he feels stable.  He is aware that nintedanib the other antifibrotic was considered second line for him because of the history of diverticulitis.  He tells me that has not had diverticulitis flareup in a long time.  He does not have any heart disease issues except he has pacemaker.  He has never had a heart attack.  He is aware now that nintedanib is very rarely associated with slight increase in MI risk.  Recently the mayor of [place]  who is the longest serving mayor in any city [place]  passed away from pulmonary fibrosis.  This was a public news.  He says he is feeling sad because of that.  Wife states it hit him hard.  Therefore he is more inclined to try nintedanib.  He wants to get this through the [facility].  In terms of symptoms he still has his cough.  He clears his throat a lot.  Albuterol  helps.  We talked about over-the-counter Mucinex he is open to this idea.  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  Because of this he did not do walk test.    OV 11/28/2021  Subjective:  [name], male , DOB: Nov 26, 1945 , age 58 y.o. , MRN: [id number] , ADDRESS: [address] [place] [address] PCP Clinic, Scott Price Patient Care Team: Clinic, Scott Price as PCP - General Hilty, Vinie BROCKS, MD as PCP - Cardiology (Cardiology)  This Provider for this visit: Treatment Team:  Attending Provider: Geronimo Amel, MD     11/28/2021 -   Chief Complaint  Patient presents with   Office Visit    Had PFT today PT states no changes since last visit     [name] 78 y.o. -for follow-up.  Presents with his wife.  He is currently on a second bottle of Ofev  100 mg twice  daily.  He is tolerating it well without any GI symptoms.  His symptoms are stable.  His pulmonary function test is stable except for very mild reduction in DLCO which could be technical variation.  He has no change in his baseline symptoms other than the fact he has got cough that is still present it is mild to moderate in severity and annoying.  Review of the records indicate that his mom had asthma and earlier in the year he did feel better with the chest tightness and some cough with Breo.  Review of the labs indicate that he is all very had elevated blood eosinophils 5 cells per cubic millimeter in 2023.  He has never had allergy  testing but he does report seasonal variation with a cough.      OV 03/06/2022  Subjective:  [name], male , DOB: September 06, 1945 , age 5 y.o. , MRN: [id number] , ADDRESS: [address] [place] [address] PCP Clinic, Scott Price Patient Care Team: Clinic, Scott Price as PCP - General Scott Vinie BROCKS, MD as PCP - Cardiology (Cardiology)  This Provider for this visit: Treatment Team:  Attending Provider: Geronimo Amel, MD    03/06/2022 -   Chief Complaint  Patient presents with   Follow-up    Pft review     [name] 78 y.o. -returns for follow-up.  Presents with his wife.  Overall he feels he is doing stable.  [facility]  He is on Advair for the last few to several months.  He is overall feeling stable but he feels the cough is the most bothersome problem although the cough itself is stable it is quite bothersome.  It is mostly in the day even at rest.  The wife describes it as bad and she is an independent historian today.  It does not bother his sleep except occasionally.  There are no new problems.  He is saying there is a lot of sinus drainage.  Last visit we did a RAST allergy  panel and blood eosinophils both elevated and abnormal.  No emergency room visits no urgent care visits no  hospitalizations.  The results of the RAST allergy  panel were reviewed.  In terms of his IPF symptom score is below.  He had pulmonary function test.  Compared to 1 year ago there is a slight decline in FVC and DLCO although quite acceptable.  He is tolerating his low-dose nintedanib protocol 100 mg twice daily fine     OV 07/03/2022  Subjective:  [name], male , DOB: November 30, 1945 , age 27 y.o. , MRN: [id number] ,  ADDRESS: [address] [place] [address] PCP Clinic, Scott Price Patient Care Team: Clinic, Scott Price as PCP - General Scott Vinie BROCKS, MD as PCP - Cardiology (Cardiology)  This Provider for this visit: Treatment Team:  Attending Provider: Geronimo Amel, MD  07/03/2022 -   Chief Complaint  Patient presents with   Follow-up    F/up on PFT     [name] 78 y.o. -presents with his wife.  Last seen February 2024.  At the time he was tolerating nintedanib low-dose protocol.  Then he called in March 2024 because of severe diarrhea and he stopped nintedanib.  Since then the diarrhea has resolved.  His shortness of breath is stable.  His cough is also stable.  But it is bothersome.  The cough is more than the shortness of breath.  Wife level the cough is 2 out of 5 but he thought it was more 4 out of 5.  It is a dry cough occasionally there are some wheezing.  He has been started on Fasenra  because of RAST allergy  positivity and high blood eosinophils.  He is approaching 8 weeks in June 2024.  So far the cough is not improved.  Did indicate to him that we will have to try for a total of 6 months before figuring out if the Fasenra  will help his cough or not.  Meanwhile he is asking if he can go back on the nintedanib.  He is aware of diverticulitis risk.  He is also aware that and he feels that if the diarrhea comes back with nintedanib then he will stop nintedanib altogether.  He wants to rechallenge.  I supported him on this.  He had a pulmonary  function test today it is actually stable.  I do not think the decline from 1 year ago is significant.  He did a sit/stand exercise hypoxemia test.  He did get short of breath but his pulse ox did not drop at all.     OV 08/28/2022  Subjective:  [name], male , DOB: September 16, 1945 , age 15 y.o. , MRN: [id number] , ADDRESS: [address] [place] [address] PCP Clinic, Scott Price Patient Care Team: Clinic, Scott Price as PCP - General Hilty, Vinie BROCKS, MD as PCP - Cardiology (Cardiology) Waddell Danelle ORN, MD as PCP - Electrophysiology (Cardiology)  This Provider for this visit: Treatment Team:  Attending Provider: Geronimo Amel, MD    08/28/2022 -   Chief Complaint  Patient presents with   Follow-up    F/up, no complaints      [name] 78 y.o. -returns for follow-up..  Presents with his wife who uses a walker.  She is an independent historian.  They both attest the same thing.  He tried nintedanib again and this time every time he tried he started vomiting so he stopped.  He only took it for a total of 3 pills for 3 days.  Shortness of breath is stable.  His sit/stand excess hypoxemia test today is also stable.  He did not have pulmonary function test.  He continues on Fasenra  which was started for any allergic component to his chronic cough and he says it is somewhat helping.  He did not fill out his cough severely.  He was on Advair but he quit taking it because it was causing sore throat and also sensation of tongue and lips burning apparently is been going on for a year.  He did stop the Advair so he is going to see if this is getting better but after stopping the Advair his cough is definitely better.  For now we have opted to continue with Fasenra .  In the past he was not interested in clinical trials as a care option but at this point in time he is interested given the fact he does not want to challenge himself with pirfenidone  or nintedanib ever  again.  We talked about various clinical trials that involve pills [some GI side effect risk] versus injection subcutaneous not known to have GI side effects, nebulizer that has oral and chest side effects but that are local and reversible].  He is interested in any of these options but only after October 2024 when he retired from working at [employer].    OV 01/14/2023  Subjective:  [name], male , DOB: 07-Sep-1945 , age 66 y.o. , MRN: [id number] , ADDRESS: [address] [place] [address] PCP Clinic, Scott Price Patient Care Team: Clinic, Scott Price as PCP - General Hilty, Vinie BROCKS, MD as PCP - Cardiology (Cardiology) Waddell Danelle ORN, MD as PCP - Electrophysiology (Cardiology)  This Provider for this visit: Treatment Team:  Attending Provider: Geronimo Amel, MD  01/14/2023 -   Chief Complaint  Patient presents with   Follow-up    Breathing is overall doing well. He has some wheezing and cough at night- prod with clear sputum.      [name] 78 y.o. -returns for follow-up.  This time his wife is not with him.  But his son-in-law Caron Curly is that with him.  Caron is somewhat of an independent historian.  Patient tells me that shortness of breath is stable by the office also stable but the cough is the biggest problem and it is unchanged.  The Fasenra  is not helping.  He has been on it for 8 months now.  Therefore we took a shared decision making to stop it.  We talked about alternatives.  We recommended gabapentin because he does not wake up in the middle of the night from his cough.  Is present mostly in the daytime and as soon as he gets up in the morning.  He clears his throat.  He also is pointing to his throat and upper airway as a source of the cough.  However he does not want to do gabapentin because he said he is somebody at the [place] gave it to him for some unclear reason of year or 2 ago and it gave him side effects not otherwise  specified.  We discussed chronic prednisone he does not want to do it because of hyperglycemia.  Therefore we took a shared decision making to do Hycodan which is opioid.  Cautioned about the side effects.  I had pulmonary function test in November 2024 and is stable he is not taking his Advair.  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   Last CT scan of the chest October 2022 and without any cancer.  Currently 78 years old.   JULY 2025   History of Present Illness   Scott Price is a 78 year old male with idiopathic pulmonary fibrosis who presents for follow-up of his condition.  He has idiopathic pulmonary fibrosis and is not currently on any medication for it due to previous intolerances. He discontinued Ofev  because of diarrhea and vomiting and has an allergy  to Esbriet .  His breathing remains stable, and he can take deep breaths, although it feels difficult. No significant shortness of breath at rest or during simple tasks such as showering, changing clothes, or eating. No shortness of breath during household tasks or when walking at his own pace. He does not use oxygen and has not experienced any cough, nausea, vomiting, or diarrhea recently.  He has a pacemaker and recently underwent an adjustment due to symptoms of fatigue and dizziness. The adjustment was necessary because the pacemaker was not synchronizing with the atrial rate, leading to symptoms similar to those experienced before the pacemaker was initially placed. Since the adjustment, he reports improved energy levels, although not completely back to normal.  He has a history of neck and shoulder pain due to a plate in his neck from fusion surgery and bone spurs. He has had lower back surgery in the past. He is not currently on Fasenra  for asthma and uses albuterol  as needed, although he reports not having used it recently. No current symptoms of asthma, such as cough or shortness of breath.  He works part-time at  a park, picking up golf balls and mowing.      OV 12/04/2023  Subjective:  [name], male , DOB: 04/15/1945 , age 3 y.o. , MRN: [id number] , ADDRESS: [address] [name] PCP Clinic, Scott Price Patient Care Team: Clinic, Scott Price as PCP - General Hilty, Vinie BROCKS, MD as PCP - Cardiology (Cardiology) Waddell Danelle ORN, MD as PCP - Electrophysiology (Cardiology)  This Provider for this visit: Treatment Team:  Attending Provider: Geronimo Amel, MD    12/04/2023 -   Chief Complaint  Patient presents with   Interstitial Lung Disease    PFT F/U  Pt states since LOV breathing has been about the same     Diagnois is based on age > 6, ex smoker, progressive disease, probable UIP pattern on CT., negative serolgy and ILD qquewtionaire and acid reflux  -Start pirfenidone  around Christmas 2022 - stopped 05/30/21 due to rash/taste  -Started low-dose nintedanib 100 mg twice daily September 2023 -?>  Stopped due to diarrhea March 2024  -Clinical research as a care option discussion done February 2023 in February 2024: Not interested.  [Concern for cough]  -Significant out of proportion cough  -likely associated cough variant asthma and chronic sinus drainage and acid reflux and ILD  -Associated with elevated blood eosinophils in 2023  -Mom with asthma  -2023 response to [facility] positively  -Elevated blood eosinophils and also RAST allergy  panel positive  -Started Fasenra  April 2024.  Stopped December 2024   Works at [employer] [place] [employer])  Basic PHI like so we have right now we have to bring them you want to make a diagnosis you have a right heart cath post diagnosis there is 1 for patient is a registry just like to follow-up every 6 months to give 3 echo for a 6-minute walk test  I think that and then there is drugs inhaler study for people not on Tyvaso.  So had to know you can we can order it through research you can send so you  what you tell the patient is we can do the same right heart catheter research are you interested in you just send me a full phone note and copy [place] and 1 side [name] 78 y.o. -Scott Price is a 78 year old male with IPF on supportive care  having failed previous to antifibrotic's.  He is here with his daughter Clotilda.  Son-in-law Caron is not here.  In terms of his IPF he feels he is stable.  Symptom score stable exercise hypoxemia test is stable.  Pulmonary function test results are stable.  But when he did his exercise test he got quite dyspneic.  He did not desaturate.  Last CT scan was in 2022.  Last echo was also in 2022.  Last hemoglobin 2023  However he is dealing with significant other issues.  - He experiences neck pain and numbness in the left hand, particularly in the vertebrae above the site of his previous neck surgery. He describes numbness and tingling in his left hand and is uncertain if these symptoms are related to bone spurs or peripheral neuropathy. He recently underwent an MRI at the [facility].  - He has experienced unintentional weight loss over the past year, decreasing from approximately 182 pounds to 166-167 pounds. No nausea, vomiting, or diarrhea.   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 His breathing has remained stable with no recent hospitalizations, emergency room visits, or urgent care visits. He has received a flu shot and does not currently use albuterol , as he does not feel it is necessary.      SYMPTOM SCALE - ILD 12/20/2020 05/30/2021 Esbriet  - rash 08/23/2021 Not on any antifibroic 11/28/2021 Ofev  100mg  bid 03/06/2022 Ofev  100mg  bid 07/03/2022 Ofev  holdiay since [id number] due to diarrha. On fasenral x 8 weeks 08/28/2022 Off ofev , Failed 2nd challeng. On Fasenral since march 203 01/14/2023 Off nintedanib.  Still on Fasenra  but it is not helping him.  PFT stable. 12/04/2023 On  supportive care.  Not on asthma Biologics.  Current weight           O2 use ra ra ra ra ra ra ra ra Room air  Shortness of Breath 0 -> 5 scale with 5 being worst (score 6 If unable to do)          At rest 2 1 2 1 2 3 2 2 1   Simple tasks - showers, clothes change, eating, shaving 3 3 3 2 2 1 2 4  2.5  Household (dishes, doing bed, laundry) 4 3 3 1 2  0 2 4 3   Shopping 3 3 2 2 2 1 3 3 3   Walking level at own pace 3 4 3 3 3 2 3 4  3.5  Walking up Stairs Does not do 4 3 3 3 1 3 4 4   Total (30-36) Dyspnea Score 15 18 16 13 14 8 15 21 17   How bad is your cough? 4 4 3  - worse at night 3 4 2  x 4 Not bad  How bad is your fatigue 4 5 3 4 4 1 3 4  Depends on activity  How bad is nausea 0 0 0 0 0 0 0 0 This is worse  How bad is vomiting?  0 0 0 0 0 0 0 0 0  How bad is diarrhea? 1 0 0 0 0 0 1 0 0  How bad is anxiety? 5 4 0 0 0 0 4 4 0  How bad is depression 0 1 0 0 0 0 0 2 Anxiety can be bad  Any chronic pain - if so where and how bad 0  Rt knoee 0 0 0 x z 0  0       SIT  STAND TEST - goal 15 times   12/04/2023    O2 used ra   PRobe - finter or forehead finger   Number sit and stand completed - goal 15 54 sec   Time taken to complete 15 time   Resting Pulse Ox/HR/Dyspnea  98% and 74/min and dyspnea of 1/10    Peak measures 99 % and 89/min and dyspnea of 8.5/10   Final Pulse Ox/HR 98% and 76/min and dyspnea of 3/10   Desaturated </= 88% no   Desaturated <= 3% points no   Got Tachycardic >/= 90/min No but almist   Miscellaneous comments Very dyspneic       PFT     Latest Ref Rng & Units 12/04/2023    9:32 AM 12/18/2022   12:21 PM 06/24/2022    3:45 PM 03/06/2022    8:40 AM 11/28/2021    9:48 AM 05/30/2021    1:11 PM  PFT Results  FVC-Pre L 4.35  P 4.00   4.15  4.27  4.32   FVC-Predicted Pre % 111  P 101  106  104  107  107   FVC-Post L   4.15      FVC-Predicted Post %   104      Pre FEV1/FVC % % 79  P 82  81  81  81  83   Post FEV1/FCV % %   84      FEV1-Pre L 3.42  P 3.29   3.45  3.35  3.48  3.57   FEV1-Predicted Pre % 122  P 116  120  117  121  123   FEV1-Post L   3.47      DLCO uncorrected ml/min/mmHg 19.57  P 20.28  19.22  18.84  18.43  20.11   DLCO UNC% % 82  P 84  79  78  76  83   DLCO corrected ml/min/mmHg  20.28  19.22  18.84  18.43  20.11   DLCO COR %Predicted %  84  79  78  76  83   DLVA Predicted % 82  P 86  82  83  75  84   TLC L   7.33      TLC % Predicted %   107      RV % Predicted %   125        P Preliminary result       LAB RESULTS last 96 hours No results found.       has a past medical history of Diabetes mellitus without complication (HCC) and High cholesterol.   reports that he quit smoking about 25 years ago. His smoking use included cigarettes. He has never used smokeless tobacco.  Past Surgical History:  Procedure Laterality Date   BACK SURGERY     LEFT HEART CATH AND CORONARY ANGIOGRAPHY N/A 11/22/2019   Procedure: LEFT HEART CATH AND CORONARY ANGIOGRAPHY;  Surgeon: Dann Candyce RAMAN, MD;  Location: [facility] INVASIVE CV LAB;  Service: Cardiovascular;  Laterality: N/A;   NECK SURGERY     PACEMAKER IMPLANT N/A 12/26/2019   Procedure: PACEMAKER IMPLANT;  Surgeon: Waddell Danelle ORN, MD;  Location: [facility] INVASIVE CV LAB;  Service: Cardiovascular;  Laterality: N/A;    Allergies  Allergen Reactions   Doxycycline Rash, Shortness Of Breath, Dermatitis and Nausea And Vomiting   Pirfenidone  Rash and Itching    Other Reaction(s): Lip swelling, Itching, Erythema, Lip swelling, Itching, Erythema   Advair Hfa [Fluticasone -Salmeterol] Other (See Comments)  Thrush and sore throat   Gabapentin Nausea And Vomiting   Lisinopril  Cough   Ofev  [Nintedanib] Nausea And Vomiting   Amoxicillin Rash   Furosemide  Rash and Dermatitis   Prednisone     High blood sugar   Sulfa Antibiotics Rash    Headache/ Chest tightness/ stiff neck    Immunization History  Administered Date(s) Administered    sv, Bivalent, Protein Subunit Rsvpref,pf  (Abrysvo) 04/11/2022   Fluad Quad(high Dose 65+) 11/18/2021   INFLUENZA, HIGH DOSE SEASONAL PF 01/31/2011, 12/16/2013, 01/09/2020, 11/15/2020, 11/11/2022   Influenza Split 11/22/2012, 11/01/2015, 11/06/2016   Influenza-Unspecified 01/18/2007, 01/13/2008, 01/12/2009, 11/03/2009, 10/05/2011, 11/03/2012, 12/04/2014, 10/13/2017, 01/13/2019, 11/03/2020, 11/19/2021   Moderna Covid-19 Fall Seasonal Vaccine 83yrs & older 03/19/2022, 11/11/2022   Moderna Covid-19 Vaccine Bivalent Booster 93yrs & up 12/25/2020   Moderna Sars-Covid-2 Vaccination 04/11/2019, 05/09/2019, 01/17/2020, 06/26/2020   PNEUMOCOCCAL CONJUGATE-20 09/24/2020   Pneumococcal Conjugate-13 10/10/2014   Pneumococcal Polysaccharide-23 01/08/2012, 09/24/2020   Pneumococcal-Unspecified 09/13/2006, 01/08/2012   Tdap 10/10/2014   Tetanus Immune Globulin 01/08/2012    Family History  Problem Relation Age of Onset   CAD Brother    CAD Brother      Current Outpatient Medications:    albuterol  (VENTOLIN  HFA) 108 (90 Base) MCG/ACT inhaler, Inhale 2 puffs into the lungs every 6 (six) hours as needed for wheezing or shortness of breath., Disp: 8 g, Rfl: 6   aspirin  81 MG EC tablet, Take 81 mg by mouth daily. , Disp: , Rfl:    Cholecalciferol  (VITAMIN D3) 25 MCG (1000 UT) CAPS, Take 1,000 Units by mouth daily., Disp: , Rfl:    cyanocobalamin  1000 MCG tablet, Take 1,000 mcg by mouth daily., Disp: , Rfl:    empagliflozin (JARDIANCE) 25 MG TABS tablet, Take 12.5 mg by mouth 2 (two) times daily., Disp: , Rfl:    ferrous sulfate  325 (65 FE) MG tablet, Take 325 mg by mouth daily with breakfast., Disp: , Rfl:    glipiZIDE  (GLUCOTROL ) 10 MG tablet, Take 5 mg by mouth 2 (two) times daily before a meal., Disp: , Rfl:    losartan  (COZAAR ) 25 MG tablet, TAKE 1 TABLET (25 MG TOTAL) BY MOUTH DAILY., Disp: 90 tablet, Rfl: 3   magnesium  oxide (MAG-OX) 400 MG tablet, Take 400 mg by mouth 2 (two) times daily., Disp: , Rfl:    meloxicam (MOBIC) 15 MG tablet,  Take by mouth daily., Disp: , Rfl:    Multiple Vitamin (MULTI-VITAMIN) tablet, Take 1 tablet by mouth daily., Disp: , Rfl:    omeprazole (PRILOSEC) 20 MG capsule, Take 20 mg by mouth See admin instructions. Every other night, Disp: , Rfl:    rosuvastatin  (CRESTOR ) 40 MG tablet, Take 40 mg by mouth at bedtime., Disp: , Rfl:    Benralizumab  (FASENRA  PEN) 30 MG/ML SOAJ, Inject 1 mL (30 mg total) into the skin every 8 (eight) weeks. MAINTENANCE DOSE, Disp: 1 mL, Rfl: 1   Benralizumab  (FASENRA  PEN) 30 MG/ML SOAJ, Inject 30mg  into the skin at Week 0, Week 4, Week 8., Disp: 1 mL, Rfl: 2   dextromethorphan-guaiFENesin (MUCINEX DM) 30-600 MG 12hr tablet, Take 1 tablet by mouth 2 (two) times daily., Disp: , Rfl:    empagliflozin (JARDIANCE) 10 MG TABS tablet, Take 5 mg by mouth in the morning and at bedtime., Disp: , Rfl:    fexofenadine (ALLEGRA) 180 MG tablet, Take 180 mg by mouth daily., Disp: , Rfl:    fluticasone -salmeterol (WIXELA INHUB) 250-50 MCG/ACT AEPB, Inhale 1 puff into the  lungs in the morning and at bedtime., Disp: 60 each, Rfl: 11   HYDROcodone  bit-homatropine (HYCODAN) 5-1.5 MG/5ML syrup, Take 5 mLs by mouth every 12 (twelve) hours as needed for cough (IPF refractory cough)., Disp: 240 mL, Rfl: 0   meclizine  (ANTIVERT ) 25 MG tablet, Take 25 mg by mouth 3 (three) times daily as needed for dizziness. (Patient not taking: Reported on 12/04/2023), Disp: , Rfl:    metFORMIN  (GLUCOPHAGE ) 1000 MG tablet, Take 1 tablet (1,000 mg total) by mouth in the morning and at bedtime., Disp: , Rfl:    nystatin  (MYCOSTATIN ) 100000 UNIT/ML suspension, Take 5 mLs (500,000 Units total) by mouth 4 (four) times daily., Disp: 473 mL, Rfl: 1   traZODone (DESYREL) 50 MG tablet, Take by mouth as needed for sleep., Disp: , Rfl:    traZODone (DESYREL) 50 MG tablet, Take 50 mg by mouth at bedtime. (Patient not taking: Reported on 12/04/2023), Disp: , Rfl:       Objective:   Vitals:   12/04/23 1009  BP: 116/64   Pulse: 74  Temp: 98.9 F (37.2 C)  TempSrc: Oral  SpO2: 98%  Weight: 167 lb 9.6 oz (76 kg)  Height: 5' 9 (1.753 m)    Estimated body mass index is 24.75 kg/m as calculated from the following:   Height as of this encounter: 5' 9 (1.753 m).   Weight as of this encounter: 167 lb 9.6 oz (76 kg).  @WEIGHTCHANGE @  [employer]   12/04/23 1009  Weight: 167 lb 9.6 oz (76 kg)     Physical Exam   General: No distress. Looks well but lsot weigh O2 at rest: no Cane present: no Sitting in wheel chair: no Frail: no Obese: no Neuro: Alert and Oriented x 3. GCS 15. Speech normal Psych: Pleasant Resp:  Barrel Chest - no.  Wheeze - no, Crackles - YES BASE, No overt respiratory distress CVS: Normal heart sounds. Murmurs - n Ext: Stigmata of Connective Tissue Disease - ono HEENT: Normal upper airway. PEERL +. No post nasal drip        Assessment/     Assessment & Plan IPF (idiopathic pulmonary fibrosis) (HCC)  DOE (dyspnea on exertion)  Unintentional weight loss  Poor appetite    PLAN Patient Instructions  IPF (idiopathic pulmonary fibrosis) (HCC) Dyspnea on exertion   - Clinically IPF is stable on supportive care but I noticed that with simple exercise he got quite short of breath. - Prior intolerance to nintedanib/pirfenidone .  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  Currently doing well with minimal symptoms to the point that you feel you do not need albuterol   Plan Expectant follow-up   GERD  Plan  - cotinue GERD control with OTC prilosec   Followup 3-4 months 15-minute visit with Dr. Geronimo but after echo and  spirometry    FOLLOWUP    Return for 3-4 months 15-minute visit with Dr. Geronimo but after echo and spirometry.    SIGNATURE    Dr. Dorethia Scott, M.D., F.C.C.P,  Pulmonary and Critical Care Medicine Staff Physician, [facility] Director - Interstitial Lung Disease  Program  Pulmonary Fibrosis [facility] -  [facility] at [facility] [place], [place], [address]  Pager: [phone], If no answer or between  15:00h - 7:00h: call [phone] Telephone: [phone]  11:09 AM 12/04/2023   Moderate Complexity MDM OFFICE  2021 E/M guidelines, first released in 2021, with minor revisions added in 2023 and 2024 Must meet the requirements for 2 out of 3 dimensions to qualify.    Number and complexity of problems addressed Amount and/or complexity of data reviewed Risk of complications and/or morbidity  One or more chronic illness with mild exacerbation, OR progression, OR  side effects of treatment  Two or more stable chronic illnesses  One undiagnosed new problem with uncertain prognosis  One acute illness with systemic symptoms   One Acute complicated injury Must meet the requirements for 1 of 3 of the categories)  Category 1: Tests and documents, historian  Any combination of 3 of the following:  Assessment requiring an independent historian  Review of prior external note(s) from each unique source  Review of results of each unique test  Ordering of each unique test    Category 2: Interpretation of tests   Independent interpretation of a test performed by another physician/other qualified health care professional (not separately reported)  Category 3: Discuss management/tests  Discussion of management or test interpretation with external physician/other qualified health care professional/appropriate source (not separately reported) Moderate risk of morbidity from additional diagnostic testing or treatment Examples  only:  Prescription drug management  Decision regarding minor surgery with identfied patient or procedure risk factors  Decision regarding elective major surgery without identified patient or procedure risk factors  Diagnosis or treatment significantly limited by social determinants of health             HIGh Complexity  OFFICE   2021 E/M guidelines, first released in 2021, with minor revisions added in 2023. Must meet the requirements for 2 out of 3 dimensions to qualify.    Number and complexity of problems addressed Amount and/or complexity of data reviewed Risk of complications and/or morbidity  Severe exacerbation of chronic illness  Acute or chronic illnesses that may pose a threat to life or bodily function, e.g., multiple trauma, acute MI, pulmonary embolus, severe respiratory distress, progressive rheumatoid arthritis, psychiatric illness with potential threat to self or others, peritonitis, acute renal failure, abrupt change in neurological status Must meet the requirements for 2 of 3 of the categories)  Category 1: Tests and documents, historian  Any combination of 3 of the following:  Assessment requiring an independent historian  Review of prior external note(s) from each unique source  Review of results of each unique test  Ordering of each unique test    Category 2: Interpretation of tests    Independent interpretation of a test performed by another physician/other qualified health care professional (not separately reported)  Category 3: Discuss management/tests  Discussion of management or test interpretation with external physician/other qualified health care professional/appropriate source (not separately reported)  HIGH risk of morbidity from additional diagnostic testing or treatment Examples only:  Drug therapy requiring intensive monitoring for toxicity  Decision for elective major surgery with identified pateint or procedure risk  factors  Decision regarding hospitalization or escalation of level of care  Decision for DNR or to de-escalate care   Parenteral controlled  substances            LEGEND - Independent interpretation involves the interpretation of a test for which there is a  CPT code, and an interpretation or report is customary. When a review and interpretation of a test is performed and documented by the provider, but not separately reported (billed), then this would represent an independent interpretation. This report does not need to conform to the usual standards of a complete report of the test. This does not include interpretation of tests that do not have formal reports such as a complete blood count with differential and blood cultures. Examples would include reviewing a chest radiograph and documenting in the medical record an interpretation, but not separately reporting (billing) the interpretation of the chest radiograph.   An appropriate source includes professionals who are not health care professionals but may be involved in the management of the patient, such as a clinical research associate, upper officer, case manager or teacher, and does not include discussion with family or informal caregivers.    - SDOH: SDOH are the conditions in the environments where people are born, live, learn, work, play, worship, and age that affect a wide range of health, functioning, and quality-of-life outcomes and risks. (e.g., housing, food insecurity, transportation, etc.). SDOH-related Z codes ranging from Z55-Z65 are the ICD-10-CM diagnosis codes used to document SDOH data Z55 - Problems related to education and literacy Z56 - Problems related to employment and unemployment Z57 - Occupational exposure to risk factors Z58 - Problems related to physical environment Z59 - Problems related to housing and economic circumstances [phone] - Problems related to social environment [phone] - Problems related to upbringing [phone] - Other  problems related to primary support group, including family circumstances Z28 - Problems related to certain psychosocial circumstances Z65 - Problems related to other psychosocial circumstances

## 2023-12-04 NOTE — Patient Instructions (Signed)
Spiro/DLCO performed today. 

## 2023-12-04 NOTE — Progress Notes (Signed)
Spiro/DLCO performed today. 

## 2023-12-04 NOTE — Progress Notes (Signed)
 xxxx

## 2023-12-04 NOTE — Patient Instructions (Addendum)
 IPF (idiopathic pulmonary fibrosis) (HCC) Dyspnea on exertion   - Clinically IPF is stable on supportive care but I noticed that with simple exercise he got quite short of breath. - Prior intolerance to nintedanib/pirfenidone .  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  Currently doing well with minimal symptoms to the point that you feel you do not need albuterol   Plan Expectant follow-up   GERD  Plan  - cotinue GERD control with OTC prilosec   Followup 3-4 months 15-minute visit with Dr. Geronimo but after echo and spirometry

## 2023-12-07 ENCOUNTER — Telehealth: Payer: Self-pay

## 2023-12-07 NOTE — Telephone Encounter (Signed)
 Copied from CRM [id number]. Topic: Clinical - Request for Lab/Test Order >> Dec 04, 2023  2:58 PM Russell PARAS wrote: Reason for CRM:   Naomie, with [facility] is req PA for echo ordered by [facility]. Advised to please place in chart  CB#  [phone]   [facility] can you help with this?

## 2023-12-07 NOTE — Telephone Encounter (Signed)
 Called and confirmed with facility. Good to go. NFN

## 2023-12-19 ENCOUNTER — Ambulatory Visit (HOSPITAL_COMMUNITY)
Admission: RE | Admit: 2023-12-19 | Discharge: 2023-12-19 | Disposition: A | Discharge status: Home / Self Care | Source: Ambulatory Visit | Attending: Internal Medicine | Admitting: Internal Medicine

## 2023-12-19 DIAGNOSIS — R0609 Other forms of dyspnea: Secondary | ICD-10-CM | POA: Diagnosis present

## 2023-12-19 DIAGNOSIS — J84112 Idiopathic pulmonary fibrosis: Secondary | ICD-10-CM | POA: Insufficient documentation

## 2023-12-19 DIAGNOSIS — R634 Abnormal weight loss: Secondary | ICD-10-CM | POA: Diagnosis present

## 2023-12-19 DIAGNOSIS — R630 Anorexia: Secondary | ICD-10-CM | POA: Insufficient documentation

## 2023-12-24 ENCOUNTER — Ambulatory Visit: Payer: Self-pay | Admitting: Internal Medicine

## 2023-12-24 NOTE — Progress Notes (Signed)
 Good news. Fibrosis stable on CCT chest 2022 -> NOv 2025. There are some asymptomatic gall stones

## 2024-01-29 ENCOUNTER — Ambulatory Visit: Payer: Medicare HMO

## 2024-01-29 DIAGNOSIS — I442 Atrioventricular block, complete: Secondary | ICD-10-CM | POA: Diagnosis not present

## 2024-02-01 LAB — CUP PACEART REMOTE DEVICE CHECK
Battery Remaining Longevity: 114 mo
Battery Remaining Percentage: 100 %
Brady Statistic RA Percent Paced: 0 %
Brady Statistic RV Percent Paced: 100 %
Date Time Interrogation Session: 20251226034000
Implantable Lead Connection Status: 753985
Implantable Lead Connection Status: 753985
Implantable Lead Implant Date: 20211122
Implantable Lead Implant Date: 20211122
Implantable Lead Location: 753859
Implantable Lead Location: 753860
Implantable Lead Model: 7841
Implantable Lead Model: 7842
Implantable Lead Serial Number: 1057413
Implantable Lead Serial Number: 1098402
Implantable Pulse Generator Implant Date: 20211122
Lead Channel Impedance Value: 634 Ohm
Lead Channel Impedance Value: 654 Ohm
Lead Channel Pacing Threshold Amplitude: 0.5 V
Lead Channel Pacing Threshold Amplitude: 0.9 V
Lead Channel Pacing Threshold Pulse Width: 0.4 ms
Lead Channel Pacing Threshold Pulse Width: 0.4 ms
Lead Channel Setting Pacing Amplitude: 2 V
Lead Channel Setting Pacing Amplitude: 2.5 V
Lead Channel Setting Pacing Pulse Width: 0.4 ms
Lead Channel Setting Sensing Sensitivity: 2.5 mV
Pulse Gen Serial Number: 962270
Zone Setting Status: 755011

## 2024-02-03 NOTE — Progress Notes (Signed)
 Remote PPM Transmission

## 2024-02-07 ENCOUNTER — Ambulatory Visit: Payer: Self-pay | Admitting: Cardiology

## 2024-02-12 ENCOUNTER — Encounter (HOSPITAL_COMMUNITY): Payer: Self-pay

## 2024-02-12 ENCOUNTER — Emergency Department (HOSPITAL_COMMUNITY)

## 2024-02-12 ENCOUNTER — Inpatient Hospital Stay (HOSPITAL_COMMUNITY)

## 2024-02-12 ENCOUNTER — Other Ambulatory Visit: Payer: Self-pay

## 2024-02-12 ENCOUNTER — Inpatient Hospital Stay (HOSPITAL_COMMUNITY)
Admission: EM | Admit: 2024-02-12 | Discharge: 2024-02-16 | DRG: 871 | Disposition: A | Discharge status: Home / Self Care | Attending: Internal Medicine | Admitting: Internal Medicine

## 2024-02-12 DIAGNOSIS — E1122 Type 2 diabetes mellitus with diabetic chronic kidney disease: Secondary | ICD-10-CM | POA: Diagnosis present

## 2024-02-12 DIAGNOSIS — G9340 Encephalopathy, unspecified: Secondary | ICD-10-CM | POA: Diagnosis not present

## 2024-02-12 DIAGNOSIS — N179 Acute kidney failure, unspecified: Secondary | ICD-10-CM | POA: Diagnosis present

## 2024-02-12 DIAGNOSIS — B1001 Human herpesvirus 6 encephalitis: Secondary | ICD-10-CM | POA: Diagnosis present

## 2024-02-12 DIAGNOSIS — I129 Hypertensive chronic kidney disease with stage 1 through stage 4 chronic kidney disease, or unspecified chronic kidney disease: Secondary | ICD-10-CM | POA: Diagnosis present

## 2024-02-12 DIAGNOSIS — Z7984 Long term (current) use of oral hypoglycemic drugs: Secondary | ICD-10-CM | POA: Diagnosis not present

## 2024-02-12 DIAGNOSIS — Z781 Physical restraint status: Secondary | ICD-10-CM

## 2024-02-12 DIAGNOSIS — Z882 Allergy status to sulfonamides status: Secondary | ICD-10-CM

## 2024-02-12 DIAGNOSIS — Z950 Presence of cardiac pacemaker: Secondary | ICD-10-CM | POA: Diagnosis not present

## 2024-02-12 DIAGNOSIS — G039 Meningitis, unspecified: Secondary | ICD-10-CM | POA: Diagnosis not present

## 2024-02-12 DIAGNOSIS — E119 Type 2 diabetes mellitus without complications: Secondary | ICD-10-CM | POA: Diagnosis not present

## 2024-02-12 DIAGNOSIS — I639 Cerebral infarction, unspecified: Secondary | ICD-10-CM | POA: Diagnosis not present

## 2024-02-12 DIAGNOSIS — A419 Sepsis, unspecified organism: Secondary | ICD-10-CM | POA: Diagnosis present

## 2024-02-12 DIAGNOSIS — I442 Atrioventricular block, complete: Secondary | ICD-10-CM | POA: Diagnosis present

## 2024-02-12 DIAGNOSIS — G9341 Metabolic encephalopathy: Secondary | ICD-10-CM | POA: Diagnosis present

## 2024-02-12 DIAGNOSIS — E8721 Acute metabolic acidosis: Secondary | ICD-10-CM | POA: Diagnosis present

## 2024-02-12 DIAGNOSIS — R4182 Altered mental status, unspecified: Secondary | ICD-10-CM | POA: Diagnosis present

## 2024-02-12 DIAGNOSIS — Z8249 Family history of ischemic heart disease and other diseases of the circulatory system: Secondary | ICD-10-CM

## 2024-02-12 DIAGNOSIS — D72829 Elevated white blood cell count, unspecified: Secondary | ICD-10-CM | POA: Diagnosis not present

## 2024-02-12 DIAGNOSIS — Z7982 Long term (current) use of aspirin: Secondary | ICD-10-CM

## 2024-02-12 DIAGNOSIS — Z79899 Other long term (current) drug therapy: Secondary | ICD-10-CM | POA: Diagnosis not present

## 2024-02-12 DIAGNOSIS — Z791 Long term (current) use of non-steroidal anti-inflammatories (NSAID): Secondary | ICD-10-CM

## 2024-02-12 DIAGNOSIS — A4189 Other specified sepsis: Principal | ICD-10-CM | POA: Diagnosis present

## 2024-02-12 DIAGNOSIS — I495 Sick sinus syndrome: Secondary | ICD-10-CM | POA: Diagnosis present

## 2024-02-12 DIAGNOSIS — R569 Unspecified convulsions: Secondary | ICD-10-CM | POA: Diagnosis present

## 2024-02-12 DIAGNOSIS — M549 Dorsalgia, unspecified: Secondary | ICD-10-CM | POA: Diagnosis not present

## 2024-02-12 DIAGNOSIS — Z7985 Long-term (current) use of injectable non-insulin antidiabetic drugs: Secondary | ICD-10-CM

## 2024-02-12 DIAGNOSIS — Z881 Allergy status to other antibiotic agents status: Secondary | ICD-10-CM

## 2024-02-12 DIAGNOSIS — N182 Chronic kidney disease, stage 2 (mild): Secondary | ICD-10-CM | POA: Diagnosis present

## 2024-02-12 DIAGNOSIS — G0490 Encephalitis and encephalomyelitis, unspecified: Secondary | ICD-10-CM

## 2024-02-12 DIAGNOSIS — B003 Herpesviral meningitis: Secondary | ICD-10-CM | POA: Diagnosis present

## 2024-02-12 DIAGNOSIS — R451 Restlessness and agitation: Secondary | ICD-10-CM

## 2024-02-12 DIAGNOSIS — D509 Iron deficiency anemia, unspecified: Secondary | ICD-10-CM | POA: Diagnosis present

## 2024-02-12 DIAGNOSIS — E860 Dehydration: Secondary | ICD-10-CM | POA: Diagnosis present

## 2024-02-12 DIAGNOSIS — Z87891 Personal history of nicotine dependence: Secondary | ICD-10-CM

## 2024-02-12 DIAGNOSIS — Z888 Allergy status to other drugs, medicaments and biological substances status: Secondary | ICD-10-CM

## 2024-02-12 DIAGNOSIS — I2510 Atherosclerotic heart disease of native coronary artery without angina pectoris: Secondary | ICD-10-CM | POA: Diagnosis present

## 2024-02-12 DIAGNOSIS — J8410 Pulmonary fibrosis, unspecified: Secondary | ICD-10-CM | POA: Diagnosis present

## 2024-02-12 DIAGNOSIS — E7800 Pure hypercholesterolemia, unspecified: Secondary | ICD-10-CM | POA: Diagnosis present

## 2024-02-12 DIAGNOSIS — R410 Disorientation, unspecified: Principal | ICD-10-CM

## 2024-02-12 DIAGNOSIS — E78019 Familial hypercholesterolemia, unspecified: Secondary | ICD-10-CM | POA: Diagnosis not present

## 2024-02-12 DIAGNOSIS — I10 Essential (primary) hypertension: Secondary | ICD-10-CM | POA: Diagnosis present

## 2024-02-12 DIAGNOSIS — Z1152 Encounter for screening for COVID-19: Secondary | ICD-10-CM

## 2024-02-12 DIAGNOSIS — E1142 Type 2 diabetes mellitus with diabetic polyneuropathy: Secondary | ICD-10-CM | POA: Insufficient documentation

## 2024-02-12 DIAGNOSIS — E785 Hyperlipidemia, unspecified: Secondary | ICD-10-CM | POA: Diagnosis present

## 2024-02-12 DIAGNOSIS — Z880 Allergy status to penicillin: Secondary | ICD-10-CM

## 2024-02-12 LAB — I-STAT CHEM 8, ED
BUN: 25 mg/dL — ABNORMAL HIGH (ref 8–23)
Calcium, Ion: 1.08 mmol/L — ABNORMAL LOW (ref 1.15–1.40)
Chloride: 104 mmol/L (ref 98–111)
Creatinine, Ser: 1.5 mg/dL — ABNORMAL HIGH (ref 0.61–1.24)
Glucose, Bld: 201 mg/dL — ABNORMAL HIGH (ref 70–99)
HCT: 42 % (ref 39.0–52.0)
Hemoglobin: 14.3 g/dL (ref 13.0–17.0)
Potassium: 4.5 mmol/L (ref 3.5–5.1)
Sodium: 139 mmol/L (ref 135–145)
TCO2: 20 mmol/L — ABNORMAL LOW (ref 22–32)

## 2024-02-12 LAB — URINALYSIS, ROUTINE W REFLEX MICROSCOPIC
Bacteria, UA: NONE SEEN
Bilirubin Urine: NEGATIVE
Bilirubin Urine: NEGATIVE
Glucose, UA: >=500 mg/dL — AB
Glucose, UA: >=500 mg/dL — AB
Hgb urine dipstick: NEGATIVE
Hgb urine dipstick: NEGATIVE
Ketones, ur: 20 mg/dL — AB
Ketones, ur: 20 mg/dL — AB
Leukocytes,Ua: NEGATIVE
Leukocytes,Ua: NEGATIVE
Nitrite: NEGATIVE
Nitrite: NEGATIVE
Protein, ur: NEGATIVE mg/dL
Protein, ur: NEGATIVE mg/dL
Specific Gravity, Urine: 1.024 (ref 1.005–1.030)
Specific Gravity, Urine: 1.026 (ref 1.005–1.030)
pH: 5 (ref 5.0–8.0)
pH: 5 (ref 5.0–8.0)

## 2024-02-12 LAB — COMPREHENSIVE METABOLIC PANEL WITH GFR
ALT: 39 U/L (ref 0–44)
AST: 33 U/L (ref 15–41)
Albumin: 4.8 g/dL (ref 3.5–5.0)
Alkaline Phosphatase: 70 U/L (ref 38–126)
Anion gap: 32 — ABNORMAL HIGH (ref 5–15)
BUN: 21 mg/dL (ref 8–23)
CO2: 12 mmol/L — ABNORMAL LOW (ref 22–32)
Calcium: 10.7 mg/dL — ABNORMAL HIGH (ref 8.9–10.3)
Chloride: 96 mmol/L — ABNORMAL LOW (ref 98–111)
Creatinine, Ser: 1.49 mg/dL — ABNORMAL HIGH (ref 0.61–1.24)
GFR, Estimated: 47 mL/min — ABNORMAL LOW
Glucose, Bld: 262 mg/dL — ABNORMAL HIGH (ref 70–99)
Potassium: 4.1 mmol/L (ref 3.5–5.1)
Sodium: 140 mmol/L (ref 135–145)
Total Bilirubin: 1 mg/dL (ref 0.0–1.2)
Total Protein: 7.6 g/dL (ref 6.5–8.1)

## 2024-02-12 LAB — MENINGITIS/ENCEPHALITIS PANEL (CSF)
Cryptococcus neoformans/gattii (CSF): NOT DETECTED
Cytomegalovirus (CSF): NOT DETECTED
Enterovirus (CSF): NOT DETECTED
Escherichia coli K1 (CSF): NOT DETECTED
Haemophilus influenzae (CSF): NOT DETECTED
Herpes simplex virus 1 (CSF): NOT DETECTED
Herpes simplex virus 2 (CSF): NOT DETECTED
Human herpesvirus 6 (CSF): DETECTED — AB
Human parechovirus (CSF): NOT DETECTED
Listeria monocytogenes (CSF): NOT DETECTED
Neisseria meningitis (CSF): NOT DETECTED
Streptococcus agalactiae (CSF): NOT DETECTED
Streptococcus pneumoniae (CSF): NOT DETECTED
Varicella zoster virus (CSF): NOT DETECTED

## 2024-02-12 LAB — DIFFERENTIAL
Abs Immature Granulocytes: 0.09 K/uL — ABNORMAL HIGH (ref 0.00–0.07)
Basophils Absolute: 0 K/uL (ref 0.0–0.1)
Basophils Relative: 0 %
Eosinophils Absolute: 0.1 K/uL (ref 0.0–0.5)
Eosinophils Relative: 0 %
Immature Granulocytes: 1 %
Lymphocytes Relative: 7 %
Lymphs Abs: 1.1 K/uL (ref 0.7–4.0)
Monocytes Absolute: 1.1 K/uL — ABNORMAL HIGH (ref 0.1–1.0)
Monocytes Relative: 8 %
Neutro Abs: 12.4 K/uL — ABNORMAL HIGH (ref 1.7–7.7)
Neutrophils Relative %: 84 %

## 2024-02-12 LAB — URINE DRUG SCREEN
Amphetamines: NEGATIVE
Barbiturates: NEGATIVE
Benzodiazepines: POSITIVE — AB
Cocaine: NEGATIVE
Fentanyl: NEGATIVE
Methadone Scn, Ur: NEGATIVE
Opiates: NEGATIVE
Tetrahydrocannabinol: POSITIVE — AB

## 2024-02-12 LAB — CSF CELL COUNT WITH DIFFERENTIAL
RBC Count, CSF: 13 /mm3 — ABNORMAL HIGH
RBC Count, CSF: 4 /mm3 — ABNORMAL HIGH
Tube #: 1
Tube #: 4
WBC, CSF: 1 /mm3 (ref 0–5)
WBC, CSF: 2 /mm3 (ref 0–5)

## 2024-02-12 LAB — CBC
HCT: 42.9 % (ref 39.0–52.0)
Hemoglobin: 14 g/dL (ref 13.0–17.0)
MCH: 30.3 pg (ref 26.0–34.0)
MCHC: 32.6 g/dL (ref 30.0–36.0)
MCV: 92.9 fL (ref 80.0–100.0)
Platelets: 207 K/uL (ref 150–400)
RBC: 4.62 MIL/uL (ref 4.22–5.81)
RDW: 14 % (ref 11.5–15.5)
WBC: 14.9 K/uL — ABNORMAL HIGH (ref 4.0–10.5)
nRBC: 0 % (ref 0.0–0.2)

## 2024-02-12 LAB — APTT: aPTT: 27 s (ref 24–36)

## 2024-02-12 LAB — CBG MONITORING, ED: Glucose-Capillary: 186 mg/dL — ABNORMAL HIGH (ref 70–99)

## 2024-02-12 LAB — GLUCOSE, CSF: Glucose, CSF: 114 mg/dL — ABNORMAL HIGH (ref 40–70)

## 2024-02-12 LAB — PROTIME-INR
INR: 1 (ref 0.8–1.2)
Prothrombin Time: 14.1 s (ref 11.4–15.2)

## 2024-02-12 LAB — PROTEIN, CSF: Total  Protein, CSF: 54 mg/dL — ABNORMAL HIGH (ref 15–45)

## 2024-02-12 LAB — ETHANOL: Alcohol, Ethyl (B): <15 mg/dL

## 2024-02-12 MED ORDER — DEXTROSE 5 % IV SOLN
10.0000 mg/kg | Freq: Once | INTRAVENOUS | Status: AC
  Administered 2024-02-12: 800 mg via INTRAVENOUS
  Filled 2024-02-12: qty 16

## 2024-02-12 MED ORDER — DEXTROSE 5 % IV SOLN
10.0000 mg/kg | Freq: Two times a day (BID) | INTRAVENOUS | Status: DC
  Administered 2024-02-13: 785 mg via INTRAVENOUS
  Filled 2024-02-12 (×3): qty 15.7

## 2024-02-12 MED ORDER — SODIUM CHLORIDE 0.9 % IV SOLN: INTRAVENOUS | Status: DC

## 2024-02-12 MED ORDER — MORPHINE SULFATE (PF) 4 MG/ML IV SOLN
4.0000 mg | Freq: Once | INTRAVENOUS | Status: AC
  Administered 2024-02-12: 4 mg via INTRAVENOUS
  Filled 2024-02-12: qty 1

## 2024-02-12 MED ORDER — VANCOMYCIN HCL IN DEXTROSE 1-5 GM/200ML-% IV SOLN
1000.0000 mg | INTRAVENOUS | Status: DC
  Administered 2024-02-13 – 2024-02-14 (×2): 1000 mg via INTRAVENOUS
  Filled 2024-02-12 (×2): qty 200

## 2024-02-12 MED ORDER — HYDRALAZINE HCL 20 MG/ML IJ SOLN: 10.0000 mg | Freq: Four times a day (QID) | INTRAMUSCULAR | Status: DC | PRN

## 2024-02-12 MED ORDER — KETAMINE HCL 50 MG/5ML IJ SOSY
PREFILLED_SYRINGE | INTRAMUSCULAR | Status: AC
  Administered 2024-02-12: 50 mg via INTRAVENOUS
  Filled 2024-02-12: qty 5

## 2024-02-12 MED ORDER — SODIUM CHLORIDE 0.9 % IV SOLN
2.0000 g | Freq: Two times a day (BID) | INTRAVENOUS | Status: DC
  Administered 2024-02-12 – 2024-02-14 (×4): 2 g via INTRAVENOUS
  Filled 2024-02-12 (×5): qty 40

## 2024-02-12 MED ORDER — LORAZEPAM 2 MG/ML IJ SOLN
INTRAMUSCULAR | Status: AC
  Administered 2024-02-12: 2 mg
  Filled 2024-02-12: qty 1

## 2024-02-12 MED ORDER — LEVETIRACETAM (KEPPRA) 500 MG/5 ML ADULT IV PUSH
500.0000 mg | Freq: Two times a day (BID) | INTRAVENOUS | Status: DC
  Administered 2024-02-13 – 2024-02-15 (×5): 500 mg via INTRAVENOUS
  Filled 2024-02-12 (×5): qty 5

## 2024-02-12 MED ORDER — VANCOMYCIN HCL 1750 MG/350ML IV SOLN
1750.0000 mg | Freq: Once | INTRAVENOUS | Status: AC
  Administered 2024-02-12: 1750 mg via INTRAVENOUS
  Filled 2024-02-12: qty 350

## 2024-02-12 MED ORDER — LORAZEPAM 2 MG/ML IJ SOLN
1.0000 mg | Freq: Once | INTRAMUSCULAR | Status: AC
  Administered 2024-02-12: 1 mg via INTRAVENOUS
  Filled 2024-02-12: qty 1

## 2024-02-12 MED ORDER — LORAZEPAM 2 MG/ML IJ SOLN
2.0000 mg | Freq: Once | INTRAMUSCULAR | Status: AC
  Administered 2024-02-12: 2 mg via INTRAVENOUS
  Filled 2024-02-12: qty 1

## 2024-02-12 MED ORDER — SODIUM CHLORIDE 0.9 % IV SOLN
2.0000 g | Freq: Once | INTRAVENOUS | Status: AC
  Administered 2024-02-12: 2 g via INTRAVENOUS
  Filled 2024-02-12: qty 20

## 2024-02-12 MED ORDER — LEVETIRACETAM (KEPPRA) 500 MG/5 ML ADULT IV PUSH
2000.0000 mg | Freq: Two times a day (BID) | INTRAVENOUS | Status: DC
  Administered 2024-02-12: 2000 mg via INTRAVENOUS
  Filled 2024-02-12: qty 20

## 2024-02-12 MED ORDER — KETAMINE HCL 50 MG/5ML IJ SOSY: 1.0000 mg/kg | PREFILLED_SYRINGE | Freq: Once | INTRAMUSCULAR | Status: AC

## 2024-02-12 MED ORDER — CLEVIDIPINE BUTYRATE 0.5 MG/ML IV EMUL
0.0000 mg/h | INTRAVENOUS | Status: DC
  Filled 2024-02-12: qty 50

## 2024-02-12 MED ORDER — LABETALOL HCL 5 MG/ML IV SOLN
20.0000 mg | Freq: Once | INTRAVENOUS | Status: DC | PRN
  Filled 2024-02-12: qty 4

## 2024-02-12 NOTE — Consult Note (Signed)
 [facility] TeleNeurology Consult Services    Date of Service:  02/12/2023     Metrics: Last Known Well: Unable to determine as patient is encephalopathic. EMS not present. Symptoms: As per HPI.  Patient is not a candidate for thrombolytic: Overall presentation is most consistent with an agitated delirium and possible seizure activity, with no focal weakness to suggest a stroke.   Location of the provider: [facility]  Location of the patient: [facility] Pre-Morbid Modified Rankin Scale: Unknown, but works and has no history of stroke. Most likely 0.    This consult was provided via telemedicine with 2-way video and audio communication. The patient/family was informed that care would be provided in this way and agreed to receive care in this manner.   ED Physician notified of diagnostic impression and management plan at 3:49 PM   Assessment: 79 year old male presenting with signs and symptoms that are most consistent with an agitated delirium and possible seizure activity  - NIHSS is confounded by his agitation and altered mental status. Moves all 4 limbs equally to noxious stimuli, but is not following commands, does not attend to examiners and exhibits nearly constant high-amplitude tremulous movements of his legs > arms that are non-synchronous, non-rhythmic, wax and wane and tend to worsen with stimulation.  - CT head: No acute intracranial abnormality. ASPECTS 10 - Impression:  - Acute onset of agitated delirium and waxing/waning high amplitude tremor-like movements. DDx includes toxidrome (does use marijuana and is elderly, was nauseous prior to onset of his AMS), atypical seizure presentation and metabolic or infectious encephalopathy.  - Semiology of his initial acute change in mental status in conjunction with his exam would be highly atypical for stroke. No eye movement abnormalities to suggest a distal basilar occlusion  - The abrupt onset of his  symptoms would be atypical for EtOH withdrawal     Recommendations: - Keppra  2000 mg IV load x 1 (ordered), then 500 mg IV BID.  - Transfer to [facility] for STAT EEG. May need LTM - UDS - EtOH level  - Metabolic encephalopathy work up - Infectious work up - MRI brain   - May need to be started on CIWA protocol    ------------------------------------------------------------------------------   History of Present Illness: 79 year old male with a PMHx of DM, nonobstructive CAD, pulmonary fibrosis, pacemaker and hypercholesterolemia who presents via EMS to the ED from home after he became nonverbal with wife at home after he came home from work feeling nauseous. He vomited once after getting back from work and vomited again with EMS. He had called his wife's name, but then he seemed to to be looking through her when she addressed him, with patient continuing to call her name while she was standing in front of him, but otherwise not communicative. EMS stated that his right foot and right arm were twitching. CBG 213, originally 182. The patient has been starting Ozempic but was taken off of this medication after his second dose yesterday. Code Stroke was called in the ED.    Home medications include ASA.   Past Medical History: Past Medical History:  Diagnosis Date   Diabetes mellitus without complication (HCC)    High cholesterol      Past Surgical History: Past Surgical History:  Procedure Laterality Date   BACK SURGERY     LEFT HEART CATH AND CORONARY ANGIOGRAPHY N/A 11/22/2019   Procedure: LEFT HEART CATH AND CORONARY ANGIOGRAPHY;  Surgeon: Dann Candyce RAMAN,  MD;  Location: [facility];  Service: Cardiovascular;  Laterality: N/A;   NECK SURGERY     PACEMAKER IMPLANT N/A 12/26/2019   Procedure: PACEMAKER IMPLANT;  Surgeon: Waddell Danelle ORN, MD;  Location: [facility];  Service: Cardiovascular;  Laterality: N/A;     Medications:  Medications Ordered Prior to  Encounter[1]      Social History: Former smoker Used marijuana No current drinking   Family History:  Reviewed in Epic   ROS: As per HPI    Anticoagulant use:  No   Antiplatelet use: Yes   Examination:    BP (!) 113/54   Resp (!) 27   Ht 5' 9 (1.753 m)   BMI 24.75 kg/m     1A: Level of Consciousness - 1 1B: Ask Month and Age - 2 1C: Blink Eyes & Squeeze Hands - 2 2: Test Horizontal Extraocular Movements - 0 3: Test Visual Fields - 0 4: Test Facial Palsy (Use Grimace if Obtunded) - 0 5A: Test Left Arm Motor Drift - 3 5B: Test Right Arm Motor Drift - 3 6A: Test Left Leg Motor Drift - 3 6B: Test Right Leg Motor Drift - 3 7: Test Limb Ataxia (FNF/Heel-Shin) - 0 8: Test Sensation -  0 9: Test Language/Aphasia - 2 10: Test Dysarthria - 2 11: Test Extinction/Inattention - 0   NIHSS Score: 21     Patient/Family was informed the Neurology Consult would occur via TeleHealth consult by way of interactive audio and video telecommunications and consented to receiving care in this manner.   Patient is being evaluated for possible acute neurologic impairment and high pretest probability of imminent or life-threatening deterioration. I spent total of 55 minutes providing care to this patient, including time for face to face visit via telemedicine, review of medical records, imaging studies and discussion of findings with providers, the patient and/or family.   Electronically signed: Dr. Camellia Shark     [1]  No current facility-administered medications on file prior to encounter.   Current Outpatient Medications on File Prior to Encounter  Medication Sig Dispense Refill   albuterol  (VENTOLIN  HFA) 108 (90 Base) MCG/ACT inhaler Inhale 2 puffs into the lungs every 6 (six) hours as needed for wheezing or shortness of breath. 8 g 6   aspirin  81 MG EC tablet Take 81 mg by mouth daily.      Benralizumab  (FASENRA  PEN) 30 MG/ML SOAJ Inject 1 mL (30 mg total) into the skin every 8  (eight) weeks. MAINTENANCE DOSE 1 mL 1   Benralizumab  (FASENRA  PEN) 30 MG/ML SOAJ Inject 30mg  into the skin at Week 0, Week 4, Week 8. 1 mL 2   Cholecalciferol  (VITAMIN D3) 25 MCG (1000 UT) CAPS Take 1,000 Units by mouth daily.     cyanocobalamin  1000 MCG tablet Take 1,000 mcg by mouth daily.     dextromethorphan-guaiFENesin (MUCINEX DM) 30-600 MG 12hr tablet Take 1 tablet by mouth 2 (two) times daily.     empagliflozin (JARDIANCE) 10 MG TABS tablet Take 5 mg by mouth in the morning and at bedtime.     empagliflozin (JARDIANCE) 25 MG TABS tablet Take 12.5 mg by mouth 2 (two) times daily.     ferrous sulfate  325 (65 FE) MG tablet Take 325 mg by mouth daily with breakfast.     fexofenadine (ALLEGRA) 180 MG tablet Take 180 mg by mouth daily.     fluticasone -salmeterol (WIXELA INHUB) 250-50 MCG/ACT AEPB Inhale 1 puff into the lungs  in the morning and at bedtime. 60 each 11   glipiZIDE  (GLUCOTROL ) 10 MG tablet Take 5 mg by mouth 2 (two) times daily before a meal.     HYDROcodone  bit-homatropine (HYCODAN) 5-1.5 MG/5ML syrup Take 5 mLs by mouth every 12 (twelve) hours as needed for cough (IPF refractory cough). 240 mL 0   losartan  (COZAAR ) 25 MG tablet TAKE 1 TABLET (25 MG TOTAL) BY MOUTH DAILY. 90 tablet 3   magnesium  oxide (MAG-OX) 400 MG tablet Take 400 mg by mouth 2 (two) times daily.     meclizine  (ANTIVERT ) 25 MG tablet Take 25 mg by mouth 3 (three) times daily as needed for dizziness. (Patient not taking: Reported on 12/04/2023)     meloxicam (MOBIC) 15 MG tablet Take by mouth daily.     metFORMIN  (GLUCOPHAGE ) 1000 MG tablet Take 1 tablet (1,000 mg total) by mouth in the morning and at bedtime.     Multiple Vitamin (MULTI-VITAMIN) tablet Take 1 tablet by mouth daily.     nystatin  (MYCOSTATIN ) 100000 UNIT/ML suspension Take 5 mLs (500,000 Units total) by mouth 4 (four) times daily. 473 mL 1   omeprazole (PRILOSEC) 20 MG capsule Take 20 mg by mouth See admin instructions. Every other night      rosuvastatin  (CRESTOR ) 40 MG tablet Take 40 mg by mouth at bedtime.     traZODone (DESYREL) 50 MG tablet Take by mouth as needed for sleep.     traZODone (DESYREL) 50 MG tablet Take 50 mg by mouth at bedtime. (Patient not taking: Reported on 12/04/2023)

## 2024-02-12 NOTE — ED Notes (Signed)
 [facility] called to transport patient. Nurse made aware

## 2024-02-12 NOTE — Consult Note (Addendum)
 NEUROLOGY CONSULT NOTE   Date of service: February 12, 2024 Patient Name: [facility] MRN:  [id number] DOB:  [birth year] Chief Complaint: seizure activity and AMS Requesting Provider: Cleotilde Rogue, MD  History of Present Illness  [facility] is a 79 y.o. male with hx of  DM, HTN, HLD CAD, pulmonary fibrosis, s/p PPM  who presented to [facility] for evaluation of nausea after work and then while speaking to his wife became aphasic. EMS noted twitching of his right arm and foot. CT head at OSH with no acute process. He was given 2000mg  IV Keppra  at [facility]. He was transferred to [facility] for further evaluation   On arrival to [facility] ED he was calm, soon after he became very agitated and was standing beside the bed urinating. He had become very agitated requiring security to hold patient down. Patient does not verbalize or follow commands, he appears to be equally strong in all 4 extremities. He was given 2 mg IV ativan . He is noted to have a temp of 101.3 in [facility] Ed and a leukocytosis of 14.9 @ [facility]. UA has been sent.  Neurology consulted     ROS  Comprehensive ROS Unable to ascertain due to AMS  Past History   Past Medical History:  Diagnosis Date   Diabetes mellitus without complication (HCC)    High cholesterol     Past Surgical History:  Procedure Laterality Date   BACK SURGERY     LEFT HEART CATH AND CORONARY ANGIOGRAPHY N/A 11/22/2019   Procedure: LEFT HEART CATH AND CORONARY ANGIOGRAPHY;  Surgeon: Dann Candyce RAMAN, MD;  [facility]: [facility];  Service: Cardiovascular;  Laterality: N/A;   NECK SURGERY     PACEMAKER IMPLANT N/A 12/26/2019   Procedure: PACEMAKER IMPLANT;  Surgeon: Waddell Danelle ORN, MD;  [facility]: [facility];  Service: Cardiovascular;  Laterality: N/A;    Family History: Family History  Problem Relation Age of Onset   CAD Brother    CAD Brother     Social History  reports that he quit smoking about 26 years ago. His smoking use included cigarettes. He has never  used smokeless tobacco. He reports that he does not currently use alcohol. He reports current drug use. Drug: Marijuana.  Allergies[1]  Medications  Current Medications[2]  Vitals   Vitals:   02/12/24 1535 02/12/24 1630 02/12/24 1645 02/12/24 1804  BP: (!) 151/92 (!) 140/66    Pulse: 68 75 70   Resp: (!) 31 (!) 24 (!) 25   Temp: (!) 97.3 F (36.3 C)   (!) 101.3 F (38.5 C)  TempSrc: Rectal   Rectal  SpO2: 96% 100% 97%   Height:        Body mass index is 24.75 kg/m.   Physical Exam   Constitutional: Appears well-developed and well-nourished.   Psych: Affect appropriate to situation.   Eyes: No scleral injection.   HENT: No OP obstruction.   Head: Normocephalic.   Cardiovascular: Normal rate and regular rhythm.   Respiratory: Effort normal, non-labored breathing.   GI: Soft.  No distension. There is no tenderness.   Skin: WDI.    Neurologic Examination   Mental Status -  Patient is very agitated, not speaking or following commands.   Cranial Nerves II - XII - II - Visual field intact OU . III, IV, VI - Extraocular movements intact . V - Facial sensation intact bilaterally . VII - Facial movement intact bilaterally . VIII - Hearing & vestibular  intact bilaterally . X - Unable to assess  XI - Chin turning & shoulder shrug intact bilaterally . XII - unable to assess   Motor Strength - The patients strength appears to be equal and strong in all 4 extremities   Bulk was normal and fasciculations were absent .   Motor Tone - Muscle tone was assessed at the neck and appendages and was normal . Sensory - Responds to noxious stimuli Coordination - unable to assess   Gait and Station - deferred.  Labs/Imaging/Neurodiagnostic studies   CBC:  Recent Labs  Lab 02-24-24 1544  WBC 14.9*  NEUTROABS 12.4*  HGB 14.0  HCT 42.9  MCV 92.9  PLT 207   Basic Metabolic Panel:  Lab Results  Component Value Date   NA 140 2024/02/24   K 4.1 02/24/24   CO2 12 (L)  February 24, 2024   GLUCOSE 262 (H) 02/24/2024   BUN 21 02-24-2024   CREATININE 1.49 (H) 2024-02-24   CALCIUM  10.7 (H) 2024/02/24   GFRNONAA 47 (L) February 24, 2024   GFRAA 65 12/14/2019   Lipid Panel:  Lab Results  Component Value Date   LDLCALC 29 04/30/2021   HgbA1c: No results found for: HGBA1C Urine Drug Screen: No results found for: LABOPIA, COCAINSCRNUR, LABBENZ, AMPHETMU, THCU, LABBARB  Alcohol Level     Component Value Date/Time   ETH <15 02-24-24 1544   INR  Lab Results  Component Value Date   INR 1.0 02/24/2024   APTT  Lab Results  Component Value Date   APTT 27 02-24-24   AED levels: No results found for: PHENYTOIN, ZONISAMIDE, LAMOTRIGINE, LEVETIRACETA  CT Head without contrast(Personally reviewed):  No acute process  Neurodiagnostics LTM EEG:  Ordered   ASSESSMENT   [facility] is a 79 y.o. male  DM, HTN, HLD CAD, pulmonary fibrosis who presented to [facility] for evaluation of nausea after work and then while speaking to his wife became aphasic. EMS noted twitching of his right arm and foot. CT head at OSH with no acute process. He was given 2000mg  IV Keppra  at [facility]. He is noted to have a temp of 101.3 in [facility] Ed and a leukocytosis of 14.9 @ [facility]. UA has been sent.   RECOMMENDATIONS   - Seizure precautions  - LTM EEG to evaluate for seizure  - Keppra  500mg  IV BID  - Recommend LP and send CSF for: protein and glucose, ME panel, Cell count, culture and gram stain - UA, and blood cultures, CXR - Check UDS  - empiric antibiotic coverage for meningitis with Vanc, ceftriaxone  and acyclovir   - MRI brain w/o to evaluate for acute process when able. He does have a PPM and unsure of compatibility with MRI, if not compatible would recommend repeating CT head in 24 hours.  - Neurology will continue to follow  ______________________________________________________________________    Signed, Karna DELENA Geralds, NP [facility]  ATTENDING  ATTESTATION: 79 year old with abrupt onset of inability to speak and confusion when he woke up after a nap after returning from work.  His concern for stroke versus seizure at any [facility] transferred here for further workup upon arrival became agitated requiring 4 security staff to hold him down 2 mg Ativan  was given.  Moving all 4 extremities not answering questions or following commands actively resisting exam closing his eyes forcibly.  Pupils appear to be reactive.  No forced gaze deviation.  Febrile in the ED concern for possible meningitis.  Discussed with EDP loaded with Keppra  recommended LP  and start meningitis coverage with Vanco  Rocephin  and acyclovir  until P results have come back.  LTM started stat will follow read.  General infectious workup still pending with UA etc.  Neurology will continue to follow.  EEG leads not placed due to agitation.  On keppra , will try to get in am.   MRI will be helpful in he meantime if possible.    Dr. Nichola evaluated pt independently, reviewed imaging, chart, labs. Discussed and formulated plan with the Resident/APP. Changes were made to the note where appropriate. Please see APP/resident note above for details.      This patient is critically ill due to AMS, seizure, agitation and at significant risk of neurological worsening, death form heart failure, respiratory failure, recurrent stroke, bleeding from [facility], seizure, sepsis. This patient's care requires constant monitoring of vital signs, hemodynamics, respiratory and cardiac monitoring, review of multiple databases, neurological assessment, discussion with family, other specialists and medical decision making of high complexity. I spent 35 minutes of neurocritical care time in the care of this patient.   Taheera Thomann,MD      [1]  Allergies Allergen Reactions   Doxycycline Rash, Shortness Of Breath, Dermatitis and Nausea And Vomiting   Pirfenidone  Rash and Itching    Other Reaction(s): Lip  swelling, Itching, Erythema, Lip swelling, Itching, Erythema   Advair Hfa [Fluticasone -Salmeterol] Other (See Comments)    Thrush and sore throat   Gabapentin Nausea And Vomiting   Lisinopril  Cough   Ofev  [Nintedanib] Nausea And Vomiting   Amoxicillin Rash   Furosemide  Rash and Dermatitis   Prednisone     High blood sugar   Sulfa Antibiotics Rash    Headache/ Chest tightness/ stiff neck  [2] No current facility-administered medications for this encounter.  Current Outpatient Medications:    albuterol  (VENTOLIN  HFA) 108 (90 Base) MCG/ACT inhaler, Inhale 2 puffs into the lungs every 6 (six) hours as needed for wheezing or shortness of breath., Disp: 8 g, Rfl: 6   aspirin  81 MG EC tablet, Take 81 mg by mouth daily. , Disp: , Rfl:    Benralizumab  (FASENRA  PEN) 30 MG/ML SOAJ, Inject 1 mL (30 mg total) into the skin every 8 (eight) weeks. MAINTENANCE DOSE, Disp: 1 mL, Rfl: 1   Benralizumab  (FASENRA  PEN) 30 MG/ML SOAJ, Inject 30mg  into the skin at Week 0, Week 4, Week 8., Disp: 1 mL, Rfl: 2   Cholecalciferol  (VITAMIN D3) 25 MCG (1000 UT) CAPS, Take 1,000 Units by mouth daily., Disp: , Rfl:    cyanocobalamin  1000 MCG tablet, Take 1,000 mcg by mouth daily., Disp: , Rfl:    dextromethorphan-guaiFENesin (MUCINEX DM) 30-600 MG 12hr tablet, Take 1 tablet by mouth 2 (two) times daily., Disp: , Rfl:    empagliflozin (JARDIANCE) 10 MG TABS tablet, Take 5 mg by mouth in the morning and at bedtime., Disp: , Rfl:    empagliflozin (JARDIANCE) 25 MG TABS tablet, Take 12.5 mg by mouth 2 (two) times daily., Disp: , Rfl:    ferrous sulfate  325 (65 FE) MG tablet, Take 325 mg by mouth daily with breakfast., Disp: , Rfl:    fexofenadine (ALLEGRA) 180 MG tablet, Take 180 mg by mouth daily., Disp: , Rfl:    fluticasone -salmeterol (WIXELA INHUB) 250-50 MCG/ACT AEPB, Inhale 1 puff into the lungs in the morning and at bedtime., Disp: 60 each, Rfl: 11   glipiZIDE  (GLUCOTROL ) 10 MG tablet, Take 5 mg by mouth 2 (two) times  daily before a meal., Disp: , Rfl:  HYDROcodone  bit-homatropine (HYCODAN) 5-1.5 MG/5ML syrup, Take 5 mLs by mouth every 12 (twelve) hours as needed for cough (IPF refractory cough)., Disp: 240 mL, Rfl: 0   losartan  (COZAAR ) 25 MG tablet, TAKE 1 TABLET (25 MG TOTAL) BY MOUTH DAILY., Disp: 90 tablet, Rfl: 3   magnesium  oxide (MAG-OX) 400 MG tablet, Take 400 mg by mouth 2 (two) times daily., Disp: , Rfl:    meclizine  (ANTIVERT ) 25 MG tablet, Take 25 mg by mouth 3 (three) times daily as needed for dizziness. (Patient not taking: Reported on 12/04/2023), Disp: , Rfl:    meloxicam (MOBIC) 15 MG tablet, Take by mouth daily., Disp: , Rfl:    metFORMIN  (GLUCOPHAGE ) 1000 MG tablet, Take 1 tablet (1,000 mg total) by mouth in the morning and at bedtime., Disp: , Rfl:    Multiple Vitamin (MULTI-VITAMIN) tablet, Take 1 tablet by mouth daily., Disp: , Rfl:    nystatin  (MYCOSTATIN ) 100000 UNIT/ML suspension, Take 5 mLs (500,000 Units total) by mouth 4 (four) times daily., Disp: 473 mL, Rfl: 1   omeprazole (PRILOSEC) 20 MG capsule, Take 20 mg by mouth See admin instructions. Every other night, Disp: , Rfl:    rosuvastatin  (CRESTOR ) 40 MG tablet, Take 40 mg by mouth at bedtime., Disp: , Rfl:    traZODone (DESYREL) 50 MG tablet, Take by mouth as needed for sleep., Disp: , Rfl:    traZODone (DESYREL) 50 MG tablet, Take 50 mg by mouth at bedtime. (Patient not taking: Reported on 12/04/2023), Disp: , Rfl:

## 2024-02-12 NOTE — ED Triage Notes (Signed)
 Pt BIB ems by spouse. Pt came home from work feeling nauseous and while explaining to wife how he felt became nonverbal. EMS states rt foot and rt arm twitch. CBG 213 originally 182. Pt was taking Ozempic and taken off yesterday. EDP notified at this time.

## 2024-02-12 NOTE — H&P (Signed)
 " History and Physical    Patient: Scott Price FMW:[id number] DOB: 06/29/1945 DOA: 02/12/2024 DOS: the patient was seen and examined on 02/12/2024 PCP: [facility]  Patient coming from: Outside Hospital  Chief Complaint:  Chief Complaint  Patient presents with   AMS   Code Stroke   HPI: Scott Price is a 79 y.o. male with medical history significant of type 2 diabetes, essential hypertension, history of sick sinus syndrome with complete heart block status post pacemaker placement, coronary artery disease, iron deficiency anemia who was transferred from [facility] as a code stroke.  Patient is doing well apparently prior to all this.  He was working and drove himself home.  Patient was started on Ozempic last week.  First dose of this in the second of this week.  He started feeling bad and told his doctors.  He was taken off the Ozempic and told not to use it again.  Patient went back to taking his metformin  yesterday.  Today he came home and family noted he is confused.  His wife was right in front of him but he was calling her out.  Patient also with some fever.  They were worried and took him to the ER.  Patient was having acute metabolic encephalopathy of unknown cause.  Acute stroke suspected and a code stroke initiated.  Patient has been seen by neurology here.  No obvious neurologic deficit but he is agitated and confused.  He meets sepsis criteria with a temperature of 101.3, white count 14.9.  He is also having significant tachycardia.  No obvious source.  With altered mental status suspicion was for possible meningitis.  Lumbar puncture was performed and patient is being admitted to the hospital for further evaluation and treatment.  Review of Systems: As mentioned in the history of present illness. All other systems reviewed and are negative. Past Medical History:  Diagnosis Date   Diabetes mellitus without complication (HCC)    High cholesterol    Past Surgical  History:  Procedure Laterality Date   BACK SURGERY     LEFT HEART CATH AND CORONARY ANGIOGRAPHY N/A 11/22/2019   Procedure: LEFT HEART CATH AND CORONARY ANGIOGRAPHY;  Surgeon: Dann Candyce RAMAN, MD;  Location: [facility] INVASIVE CV LAB;  Service: Cardiovascular;  Laterality: N/A;   NECK SURGERY     PACEMAKER IMPLANT N/A 12/26/2019   Procedure: PACEMAKER IMPLANT;  Surgeon: Waddell Danelle ORN, MD;  Location: [facility] INVASIVE CV LAB;  Service: Cardiovascular;  Laterality: N/A;   Social History:  reports that he quit smoking about 26 years ago. His smoking use included cigarettes. He has never used smokeless tobacco. He reports that he does not currently use alcohol. He reports current drug use. Drug: Marijuana.  Allergies[1]  Family History  Problem Relation Age of Onset   CAD Brother    CAD Brother     Prior to Admission medications  Medication Sig Start Date End Date Taking? Authorizing Provider  albuterol  (VENTOLIN  HFA) 108 (90 Base) MCG/ACT inhaler Inhale 2 puffs into the lungs every 6 (six) hours as needed for wheezing or shortness of breath. 07/03/22   Geronimo Amel, MD  aspirin  81 MG EC tablet Take 81 mg by mouth daily.     [provider]  Benralizumab  (FASENRA  PEN) 30 MG/ML SOAJ Inject 1 mL (30 mg total) into the skin every 8 (eight) weeks. MAINTENANCE DOSE 04/04/22   Geronimo Amel, MD  Benralizumab  (FASENRA  PEN) 30 MG/ML SOAJ Inject 30mg  into the  skin at Week 0, Week 4, Week 8. 04/04/22   Geronimo Amel, MD  Cholecalciferol  (VITAMIN D3) 25 MCG (1000 UT) CAPS Take 1,000 Units by mouth daily.    [provider]  cyanocobalamin  1000 MCG tablet Take 1,000 mcg by mouth daily.    [provider]  dextromethorphan-guaiFENesin (MUCINEX DM) 30-600 MG 12hr tablet Take 1 tablet by mouth 2 (two) times daily.    [provider]  empagliflozin (JARDIANCE) 10 MG TABS tablet Take 5 mg by mouth in the morning and at bedtime.    [provider]  empagliflozin  (JARDIANCE) 25 MG TABS tablet Take 12.5 mg by mouth 2 (two) times daily. 03/09/23   [provider]  ferrous sulfate  325 (65 FE) MG tablet Take 325 mg by mouth daily with breakfast.    [provider]  fexofenadine (ALLEGRA) 180 MG tablet Take 180 mg by mouth daily.    [provider]  fluticasone -salmeterol (WIXELA INHUB) 250-50 MCG/ACT AEPB Inhale 1 puff into the lungs in the morning and at bedtime. 03/06/22   Geronimo Amel, MD  glipiZIDE  (GLUCOTROL ) 10 MG tablet Take 5 mg by mouth 2 (two) times daily before a meal.    [provider]  HYDROcodone  bit-homatropine (HYCODAN) 5-1.5 MG/5ML syrup Take 5 mLs by mouth every 12 (twelve) hours as needed for cough (IPF refractory cough). 01/14/23   Geronimo Amel, MD  losartan  (COZAAR ) 25 MG tablet TAKE 1 TABLET (25 MG TOTAL) BY MOUTH DAILY. 10/06/23 01/04/24  Waddell Danelle ORN, MD  magnesium  oxide (MAG-OX) 400 MG tablet Take 400 mg by mouth 2 (two) times daily.    [provider]  meclizine  (ANTIVERT ) 25 MG tablet Take 25 mg by mouth 3 (three) times daily as needed for dizziness. Patient not taking: Reported on 12/04/2023 11/12/19   [provider]  meloxicam (MOBIC) 15 MG tablet Take by mouth daily. 08/14/22   [provider]  metFORMIN  (GLUCOPHAGE ) 1000 MG tablet Take 1 tablet (1,000 mg total) by mouth in the morning and at bedtime. 11/24/19   Dann Candyce RAMAN, MD  Multiple Vitamin (MULTI-VITAMIN) tablet Take 1 tablet by mouth daily.    [provider]  nystatin  (MYCOSTATIN ) 100000 UNIT/ML suspension Take 5 mLs (500,000 Units total) by mouth 4 (four) times daily. 08/28/22   Geronimo Amel, MD  omeprazole (PRILOSEC) 20 MG capsule Take 20 mg by mouth See admin instructions. Every other night    [provider]  rosuvastatin  (CRESTOR ) 40 MG tablet Take 40 mg by mouth at bedtime.    [provider]  traZODone (DESYREL) 50 MG tablet Take by mouth as needed for sleep.  03/19/22   [provider]  traZODone (DESYREL) 50 MG tablet Take 50 mg by mouth at bedtime. Patient not taking: Reported on 12/04/2023 03/09/23   [provider]    Physical Exam: Vitals:   02/12/24 1630 02/12/24 1645 02/12/24 1804 02/12/24 1826  BP: (!) 140/66     Pulse: 75 70    Resp: (!) 24 (!) 25    Temp:   (!) 101.3 F (38.5 C)   TempSrc:   Rectal   SpO2: 100% 97%    Weight:    78.7 kg  Height:       Constitutional: Confused, agitated, patient in restraints now Eyes: PERRL, lids and conjunctivae normal ENMT: Mucous membranes are dry. Posterior pharynx clear of any exudate or lesions.Normal dentition.  Neck: normal, supple, no masses, no thyromegaly Respiratory: clear to auscultation bilaterally,  no wheezing, no crackles. Normal respiratory effort. No accessory muscle use.  Cardiovascular: Sinus tachycardia with, no murmurs / rubs / gallops. No extremity edema. 2+ pedal pulses. No carotid bruits.  Abdomen: no tenderness, no masses palpated. No hepatosplenomegaly. Bowel sounds positive.  Musculoskeletal: Good range of motion, no joint swelling or tenderness, Skin: no rashes, lesions, ulcers. No induration Neurologic: CN 2-12 grossly intact. Sensation intact, DTR normal. Strength 5/5 in all 4.  Psychiatric: Confused, agitated, not responding to simple commands  Data Reviewed:  Temperature one 1.3, blood pressure 150/92, pulse 96, respirate of 36 oxygen sat 96% on room air.  White count is 14.9, CO2 of 12 BUN 25 creatinine 1.5 and calcium  10.7.  Glucose is 201.  Urinalysis is negative alcohol level less than 15.  Chest x-ray showed no acute findings.  Head CT without contrast showed no acute findings lumbar puncture performed with results pending.  Assessment and Plan:  #1 sepsis of unknown source: Patient meets sepsis criteria with leukocytosis, fever, as well as evidence of possible infection.  Suspected meningitis most likely aseptic meningitis.  No reported  viral illness noted by family prior to onset.  Patient will be admitted to a monitored bed.  Neurology to follow.  EEG being done.  Patient was loaded with Keppra  for possible seizure.  Also on acyclovir , vancomycin , cefepime as well as Flagyl  for sepsis of unknown source.  Blood cultures as well as LP studies will be followed.  #2 suspected meningitis: Await LP results  #3 acute metabolic encephalopathy: Patient has no evidence of CVA.  Workup will continue per neurology.  When stable may get MRI of the brain.  #4 non-insulin -dependent diabetes: Initiate sliding scale insulin .  #5 essential hypertension: For now blood pressure appears stable.  IV hydralazine  ordered as needed  #6 hyperlipidemia: Holding statin until patient can take orally  #7 hypercalcemia: Probably due to dehydration.  We will hydrate and monitor calcium  level.  #8 AKI: Appears to have some elements of CKD.  Will continue to monitor  #9 sick sinus syndrome: Patient has a pacemaker in place.  Continue to monitor    Advance Care Planning:   Code Status: Full Code   Consults: Dr. Merrianne, neurologist  Family Communication: Wife and daughter at bedside  Severity of Illness: The appropriate patient status for this patient is INPATIENT. Inpatient status is judged to be reasonable and necessary in order to provide the required intensity of service to ensure the patient's safety. The patient's presenting symptoms, physical exam findings, and initial radiographic and laboratory data in the context of their chronic comorbidities is felt to place them at high risk for further clinical deterioration. Furthermore, it is not anticipated that the patient will be medically stable for discharge from the hospital within 2 midnights of admission.   * I certify that at the point of admission it is my clinical judgment that the patient will require inpatient hospital care spanning beyond 2 midnights from the point of admission due to high  intensity of service, high risk for further deterioration and high frequency of surveillance required.*  AuthorBETHA SIM KNOLL, MD 02/12/2024 7:33 PM  For on call review [email or web address].     [1]  Allergies Allergen Reactions   Doxycycline Rash, Shortness Of Breath, Dermatitis and Nausea And Vomiting   Pirfenidone  Rash and Itching    Other Reaction(s): Lip swelling, Itching, Erythema, Lip swelling, Itching, Erythema   Advair Hfa [Fluticasone -Salmeterol] Other (See Comments)    Thrush and sore throat   Gabapentin  Nausea And Vomiting   Lisinopril  Cough   Ofev  [Nintedanib] Nausea And Vomiting   Amoxicillin Rash   Furosemide  Rash and Dermatitis   Prednisone     High blood sugar   Sulfa Antibiotics Rash    Headache/ Chest tightness/ stiff neck   "

## 2024-02-12 NOTE — Progress Notes (Addendum)
 PHARMACY - PHYSICIAN COMMUNICATION CRITICAL VALUE ALERT - BLOOD CULTURE IDENTIFICATION (BCID)  Scott Price is an 79 y.o. male who presented to [facility] on 02/12/2024 with a chief complaint of AMS/Code stroke. Empiric meningitis coverage began by treatment team.  Assessment:  Rapid meningitis panel with positive HHV-6.  (typically resistant to acyclovir ).  Name of physician (or Provider) Contacted: Sim Re  Current antibiotics: Meropenem , Vancomycin , IV acyclovir   Changes to prescribed antibiotics recommended:  For most cases, symptomatic treatment is recommended. Severe cases (mainly immunocompromised patients) can require treatment with other anti-viral therapies.  Discussed with MD, plan is to leave on current therapies tonight and follow up with ID in the morning. Recommend overnight ID consult should patient's condition worsen in the meantime.  Scott Price 02/12/2024  10:25 PM

## 2024-02-12 NOTE — Progress Notes (Signed)
 A diligent attempt was made to apply and secure MRI compatible leads onto patient head for Overnight EEG.  Tech was not successful due to  ungovernable head/upper body movement the patient was maintaining. Though wrist in restraints pt was still bucking. Patient will require two or more techs at present. Attempt will be made again in morning.  Neuro-Hospitalist aware.

## 2024-02-12 NOTE — Discharge Instructions (Signed)
 Transferred to [facility] emergency department.  Dr. Ula accepting

## 2024-02-12 NOTE — ED Provider Notes (Addendum)
 I discussed the case with the patient's spouse by phone, she has given me verbal permission to perform spinal tap to rule out meningitis in this patient who is febrile tachycardic ill-appearing altered and potentially has meningitis.  I discussed the case with the neurologist who has agreed and recommended a spinal tap, labs have been ordered, Ativan  will be given for sedation the patient will be placed in the lateral decubitus position for attempt.  LP performed on first attempt, required ketamine  due to severe sedation  5 cc of clear spinal fluid sent to lab  Lumbar Puncture  Date/Time: 02/12/2024 6:53 PM  Performed by: Cleotilde Rogue, MD Authorized by: Cleotilde Rogue, MD   Consent:    Consent obtained:  Verbal   Consent given by:  Spouse   Risks, benefits, and alternatives were discussed: yes     Risks discussed:  Bleeding, infection, headache, nerve damage, repeat procedure and pain Universal protocol:    Procedure explained and questions answered to patient or proxy's satisfaction: yes     Site/side marked: yes     Patient identity confirmed:  Arm band Pre-procedure details:    Procedure purpose:  Diagnostic   Preparation: Patient was prepped and draped in usual sterile fashion   Anesthesia:    Anesthesia method:  None Procedure details:    Lumbar space:  L3-L4 interspace   Patient position:  L lateral decubitus   Needle gauge:  22   Needle type:  Spinal needle - Quincke tip   Needle length (in):  3.5   Ultrasound guidance: no     Number of attempts:  1   Fluid appearance:  Clear   Tubes of fluid:  4   Total volume (ml):  5 Post-procedure details:    Puncture site:  Adhesive bandage applied   Procedure completion:  Tolerated well, no immediate complications Comments:       .Sedation  Date/Time: 02/12/2024 6:54 PM  Performed by: Cleotilde Rogue, MD Authorized by: Cleotilde Rogue, MD   Consent:    Consent obtained:  Emergent situation Universal protocol:    Immediately  prior to procedure, a time out was called: yes     Patient identity confirmed:  Provided demographic data and arm band Indications:    Procedure performed:  Lumbar puncture   Procedure necessitating sedation performed by:  Physician performing sedation Pre-sedation assessment:    Time since last food or drink:  Unknown   NPO status caution: unable to specify NPO status     ASA classification: class 3 - patient with severe systemic disease     Mouth opening:  3 or more finger widths   Mallampati score:  II - soft palate, uvula, fauces visible   Neck mobility: normal     Pre-sedation assessments completed and reviewed: airway patency, cardiovascular function, hydration status, mental status, nausea/vomiting, pain level, respiratory function and temperature   A pre-sedation assessment was completed prior to the start of the procedure Immediate pre-procedure details:    Reassessment: Patient reassessed immediately prior to procedure     Reviewed: vital signs and relevant labs/tests     Verified: bag valve mask available, emergency equipment available, intubation equipment available, IV patency confirmed and oxygen available   Procedure details (see MAR for exact dosages):    Preoxygenation:  Nasal cannula   Sedation:  Ketamine    Intended level of sedation: deep   Intra-procedure monitoring:  Cardiac monitor, blood pressure monitoring, continuous capnometry, continuous pulse oximetry, frequent LOC assessments and frequent vital  sign checks   Intra-procedure events: none     Total Provider sedation time (minutes):  20 Post-procedure details:   A post-sedation assessment was completed following the completion of the procedure.   Attendance: Constant attendance by certified staff until patient recovered     Recovery: Patient returned to pre-procedure baseline     Post-sedation assessments completed and reviewed: airway patency, cardiovascular function, hydration status, mental status,  nausea/vomiting, pain level, respiratory function and temperature     Patient is stable for discharge or admission: yes     Procedure completion:  Tolerated well, no immediate complications Comments:       .Critical Care  Performed by: Cleotilde Rogue, MD Authorized by: Cleotilde Rogue, MD   Critical care provider statement:    Critical care time (minutes):  45   Critical care time was exclusive of:  Separately billable procedures and treating other patients and teaching time   Critical care was necessary to treat or prevent imminent or life-threatening deterioration of the following conditions:  Sepsis and CNS failure or compromise   Critical care was time spent personally by me on the following activities:  Development of treatment plan with patient or surrogate, discussions with consultants, evaluation of patient's response to treatment, examination of patient, obtaining history from patient or surrogate, review of old charts, re-evaluation of patient's condition, pulse oximetry, ordering and review of radiographic studies, ordering and review of laboratory studies and ordering and performing treatments and interventions   I assumed direction of critical care for this patient from another provider in my specialty: yes     Care discussed with: admitting provider   Comments:        D/w Dr. Sim - will admit to high level of care    Cleotilde Rogue, MD 02/12/24 JEROLYN    Cleotilde Rogue, MD 02/12/24 1907

## 2024-02-12 NOTE — Progress Notes (Addendum)
 ED Pharmacy Antibiotic Sign Off An antibiotic consult was received from an ED provider for acyclovir  and vancomycin  per pharmacy dosing for meningitis. A chart review was completed to assess appropriateness.  A single dose of ceftriaxone  placed by the ED provider.   The following one time order(s) were placed per pharmacy consult:  vancomycin  1750 mg x 1 dose acyclovir  800 mg x 1 dose (NS at 50 ml/hr)  Further antibiotic and/or antibiotic pharmacy consults should be ordered by the admitting provider if indicated.   Thank you for allowing pharmacy to be a part of this patient's care.   Dorn Buttner, PharmD, BCPS 02/12/2024 6:22 PM ED Clinical Pharmacist -  [phone]

## 2024-02-12 NOTE — Progress Notes (Addendum)
 Patient isn't available at the moment for EEG lead placement due to bedside procedure. OnCall tech will wait for patient's availability.

## 2024-02-12 NOTE — Progress Notes (Signed)
 Pharmacy Antibiotic Note  Scott Price is a 79 y.o. male for which pharmacy has been consulted for acyclovir , vancomycin , and meropenem  dosing for sepsis and meningitis coverage.  Patient with a history of T2DM, HTN, heart block s/p pacemaker, CAD. Patient presenting to [facility] ED as code stroke.  SCr 1.5 - slight elevation from baseline WBC 14.9; T 101.3; HR 70; RR 25  Meningitis panel and CSF culture pending  Plan: Acyclovir  785 mg q12hr --NS at 50 ml/hr while on IV acyclovir  Vancomycin  1750 mg x 1 then 1000 mg q24hr per nomogram dosing for CNS indication Meropenem  2g q12h for listeria coverage (amoxicillin allergy ) Monitor WBC, fever, renal function, cultures De-escalate when able Levels at steady state  Height: 5' 9 (175.3 cm) Weight: 78.7 kg (173 lb 9.6 oz) IBW/kg (Calculated) : 70.7  Temp (24hrs), Avg:99.3 F (37.4 C), Min:97.3 F (36.3 C), Max:101.3 F (38.5 C)  Recent Labs  Lab 02/12/24 1544 02/12/24 1922  WBC 14.9*  --   CREATININE 1.49* 1.50*    Estimated Creatinine Clearance: 39.9 mL/min (A) (by C-G formula based on SCr of 1.5 mg/dL (H)).    Allergies[1]  Microbiology results: Pending  Thank you for allowing pharmacy to be a part of this patients care.  Dorn Buttner, PharmD, BCPS 02/12/2024 7:34 PM ED Clinical Pharmacist -  [phone]       [1]  Allergies Allergen Reactions   Doxycycline Rash, Shortness Of Breath, Dermatitis and Nausea And Vomiting   Pirfenidone  Rash and Itching    Other Reaction(s): Lip swelling, Itching, Erythema, Lip swelling, Itching, Erythema   Advair Hfa [Fluticasone -Salmeterol] Other (See Comments)    Thrush and sore throat   Gabapentin Nausea And Vomiting   Lisinopril  Cough   Ofev  [Nintedanib] Nausea And Vomiting   Amoxicillin Rash   Furosemide  Rash and Dermatitis   Prednisone     High blood sugar   Sulfa Antibiotics Rash    Headache/ Chest tightness/ stiff neck

## 2024-02-12 NOTE — ED Notes (Signed)
 Pt arrived at CT at 1523.

## 2024-02-12 NOTE — ED Provider Notes (Signed)
 " [place] EMERGENCY DEPARTMENT AT [facility] Provider Note   CSN: [id number] Arrival date & time: 02/12/24  1514  An emergency department physician performed an initial assessment on this suspected stroke patient at 1518.  Patient presents with: AMS and Code Stroke   Scott Price is a 79 y.o. male.  {Add pertinent medical, surgical, social history, OB history to YEP:67052} Patient has a history of diabetes and elevated cholesterol.  He recently was placed on Ozempic.  He came home from work and took a nap and then when he woke up he was calling out his wife's name and that all he was saying.  When patient arrived to the emergency department he would not respond to verbal or painful stimuli and would not speak.  His eyes were open  The history is provided by the EMS personnel and a relative. No language interpreter was used.  Altered Mental Status Presenting symptoms: behavior changes   Severity:  Severe Most recent episode:  Today Episode history:  Continuous Timing:  Constant Progression:  Unchanged Chronicity:  New Context: not alcohol use   Associated symptoms: abnormal movement        Prior to Admission medications  Medication Sig Start Date End Date Taking? Authorizing Provider  albuterol  (VENTOLIN  HFA) 108 (90 Base) MCG/ACT inhaler Inhale 2 puffs into the lungs every 6 (six) hours as needed for wheezing or shortness of breath. 07/03/22   Geronimo Amel, MD  aspirin  81 MG EC tablet Take 81 mg by mouth daily.     [provider]  Benralizumab  (FASENRA  PEN) 30 MG/ML SOAJ Inject 1 mL (30 mg total) into the skin every 8 (eight) weeks. MAINTENANCE DOSE 04/04/22   Geronimo Amel, MD  Benralizumab  (FASENRA  PEN) 30 MG/ML SOAJ Inject 30mg  into the skin at Week 0, Week 4, Week 8. 04/04/22   Geronimo Amel, MD  Cholecalciferol  (VITAMIN D3) 25 MCG (1000 UT) CAPS Take 1,000 Units by mouth daily.    [provider]  cyanocobalamin  1000 MCG tablet Take  1,000 mcg by mouth daily.    [provider]  dextromethorphan-guaiFENesin (MUCINEX DM) 30-600 MG 12hr tablet Take 1 tablet by mouth 2 (two) times daily.    [provider]  empagliflozin (JARDIANCE) 10 MG TABS tablet Take 5 mg by mouth in the morning and at bedtime.    [provider]  empagliflozin (JARDIANCE) 25 MG TABS tablet Take 12.5 mg by mouth 2 (two) times daily. 03/09/23   [provider]  ferrous sulfate  325 (65 FE) MG tablet Take 325 mg by mouth daily with breakfast.    [provider]  fexofenadine (ALLEGRA) 180 MG tablet Take 180 mg by mouth daily.    [provider]  fluticasone -salmeterol (WIXELA INHUB) 250-50 MCG/ACT AEPB Inhale 1 puff into the lungs in the morning and at bedtime. 03/06/22   Geronimo Amel, MD  glipiZIDE  (GLUCOTROL ) 10 MG tablet Take 5 mg by mouth 2 (two) times daily before a meal.    [provider]  HYDROcodone  bit-homatropine (HYCODAN) 5-1.5 MG/5ML syrup Take 5 mLs by mouth every 12 (twelve) hours as needed for cough (IPF refractory cough). 01/14/23   Geronimo Amel, MD  losartan  (COZAAR ) 25 MG tablet TAKE 1 TABLET (25 MG TOTAL) BY MOUTH DAILY. 10/06/23 01/04/24  Waddell Danelle ORN, MD  magnesium  oxide (MAG-OX) 400 MG tablet Take 400 mg by mouth 2 (two) times daily.    [provider]  meclizine  (ANTIVERT ) 25 MG tablet Take 25  mg by mouth 3 (three) times daily as needed for dizziness. Patient not taking: Reported on 12/04/2023 11/12/19   [provider]  meloxicam (MOBIC) 15 MG tablet Take by mouth daily. 08/14/22   [provider]  metFORMIN  (GLUCOPHAGE ) 1000 MG tablet Take 1 tablet (1,000 mg total) by mouth in the morning and at bedtime. 11/24/19   Dann Candyce RAMAN, MD  Multiple Vitamin (MULTI-VITAMIN) tablet Take 1 tablet by mouth daily.    [provider]  nystatin  (MYCOSTATIN ) 100000 UNIT/ML suspension Take 5 mLs (500,000 Units total) by mouth 4 (four) times  daily. 08/28/22   Geronimo Amel, MD  omeprazole (PRILOSEC) 20 MG capsule Take 20 mg by mouth See admin instructions. Every other night    [provider]  rosuvastatin  (CRESTOR ) 40 MG tablet Take 40 mg by mouth at bedtime.    [provider]  traZODone (DESYREL) 50 MG tablet Take by mouth as needed for sleep. 03/19/22   [provider]  traZODone (DESYREL) 50 MG tablet Take 50 mg by mouth at bedtime. Patient not taking: Reported on 12/04/2023 03/09/23   [provider]    Allergies: Doxycycline, Pirfenidone , Advair hfa [fluticasone -salmeterol], Gabapentin, Lisinopril , Ofev  [nintedanib], Amoxicillin, Furosemide , Prednisone, and Sulfa antibiotics    Review of Systems  Unable to perform ROS: Mental status change    Updated Vital Signs BP (!) 151/92   Pulse 68   Temp (!) 97.3 F (36.3 C) (Rectal)   Resp (!) 31   Ht 5' 9 (1.753 m)   SpO2 96%   BMI 24.75 kg/m   Physical Exam Vitals and nursing note reviewed.  Constitutional:      Appearance: He is well-developed.  HENT:     Head: Normocephalic.     Nose: Nose normal.     Mouth/Throat:     Mouth: Mucous membranes are moist.  Eyes:     General: No scleral icterus.    Conjunctiva/sclera: Conjunctivae normal.  Neck:     Thyroid: No thyromegaly.  Cardiovascular:     Rate and Rhythm: Normal rate and regular rhythm.     Heart sounds: No murmur heard.    No friction rub. No gallop.  Pulmonary:     Breath sounds: No stridor. No wheezing or rales.  Chest:     Chest wall: No tenderness.  Abdominal:     General: There is no distension.     Tenderness: There is no abdominal tenderness. There is no rebound.  Musculoskeletal:     Cervical back: Neck supple.     Comments: Abnormal movement in upper extremities  Lymphadenopathy:     Cervical: No cervical adenopathy.  Skin:    Findings: No erythema or rash.  Neurological:     Mental Status: He is alert.     Motor: No abnormal muscle tone.      Coordination: Coordination normal.     Comments: Patient not responding to verbal or painful stimuli.  Patient is awake and not speaking.     (all labs ordered are listed, but only abnormal results are displayed) Labs Reviewed  CBC - Abnormal; Notable for the following components:      Result Value   WBC 14.9 (*)    All other components within normal limits  DIFFERENTIAL - Abnormal; Notable for the following components:   Neutro Abs 12.4 (*)    Monocytes Absolute 1.1 (*)    Abs Immature Granulocytes 0.09 (*)    All other components within normal limits  CBG  MONITORING, ED - Abnormal; Notable for the following components:   Glucose-Capillary 186 (*)    All other components within normal limits  ETHANOL  PROTIME-INR  APTT  COMPREHENSIVE METABOLIC PANEL WITH GFR  URINE DRUG SCREEN  I-STAT CHEM 8, ED    EKG: None  Radiology: CT HEAD CODE STROKE WO CONTRAST (LKW 0-4.5h, LVO 0-24h) Result Date: 02/12/2024 EXAM: CT HEAD WITHOUT CONTRAST 02/12/2024 03:33:18 PM TECHNIQUE: CT of the head was performed without the administration of intravenous contrast. Automated exposure control, iterative reconstruction, and/or weight based adjustment of the mA/kV was utilized to reduce the radiation dose to as low as reasonably achievable. COMPARISON: None available. CLINICAL HISTORY: Neuro deficit, acute, stroke suspected. FINDINGS: BRAIN AND VENTRICLES: No acute hemorrhage. No evidence of acute infarct. No hydrocephalus. No extra-axial collection. No mass effect or midline shift. Atherosclerotic calcifications within cavernous and supraclinoid internal carotid arteries. [place] Stroke Program Early CT (ASPECT) score: Ganglionic (caudate, internal capsule, lentiform nucleus, insula, M1-M3): 7 Supraganglionic (M4-M6): 3 Total: 10 ORBITS: No acute abnormality. SINUSES: No acute abnormality. SOFT TISSUES AND SKULL: No acute soft tissue abnormality. No skull fracture. IMPRESSION: 1. No acute intracranial  abnormality. 2. ASPECTS 10. 3. Findings messaged to Dr. Lindzen at 3:42 PM on 02/12/24. Electronically signed by: Donnice Mania MD MD 02/12/2024 03:42 PM EST RP Workstation: [id number]    {Document cardiac monitor, telemetry assessment procedure when appropriate:32947} Procedures   Medications Ordered in the ED  labetalol  (NORMODYNE ) injection 20 mg (has no administration in time range)    And  clevidipine  (CLEVIPREX ) infusion 0.5 mg/mL ( Intravenous Not Given 02/12/24 1608)  levETIRAcetam  (KEPPRA ) undiluted injection 2,000 mg (2,000 mg Intravenous Given 02/12/24 1607)     Patient with acute altered mental status and unusual movement in his arms and legs.  He was seen by Dr. Merrianne neurology and he recommended loading the patient with Keppra  and transporting them immediately to [facility] emergency department for continuous EEG along with admission to medicine with neurology consult {Click here for ABCD2, HEART and other calculators REFRESH Note before signing:1}                              Medical Decision Making Amount and/or Complexity of Data Reviewed Labs: ordered. Radiology: ordered.  Risk Prescription drug management.   Acute altered mental status with unusual movement in his extremities.  Possible seizure.  Patient will be transferred to [facility] emergency department for continuous EEG.  He is loaded with Keppra .  Dr. ONEIDA is the accepting physician in the emergency department  {Document critical care time when appropriate  Document review of labs and clinical decision tools ie CHADS2VASC2, etc  Document your independent review of radiology images and any outside records  Document your discussion with family members, caretakers and with consultants  Document social determinants of health affecting pt's care  Document your decision making why or why not admission, treatments were needed:32947:::1}   Final diagnoses:  Delirium    ED Discharge Orders     None        "

## 2024-02-13 ENCOUNTER — Inpatient Hospital Stay (HOSPITAL_COMMUNITY)

## 2024-02-13 DIAGNOSIS — E119 Type 2 diabetes mellitus without complications: Secondary | ICD-10-CM | POA: Diagnosis not present

## 2024-02-13 DIAGNOSIS — D72829 Elevated white blood cell count, unspecified: Secondary | ICD-10-CM | POA: Diagnosis not present

## 2024-02-13 DIAGNOSIS — Z950 Presence of cardiac pacemaker: Secondary | ICD-10-CM | POA: Diagnosis not present

## 2024-02-13 DIAGNOSIS — G039 Meningitis, unspecified: Secondary | ICD-10-CM | POA: Diagnosis not present

## 2024-02-13 DIAGNOSIS — N179 Acute kidney failure, unspecified: Secondary | ICD-10-CM | POA: Diagnosis not present

## 2024-02-13 DIAGNOSIS — I495 Sick sinus syndrome: Secondary | ICD-10-CM | POA: Diagnosis not present

## 2024-02-13 DIAGNOSIS — R451 Restlessness and agitation: Secondary | ICD-10-CM | POA: Diagnosis not present

## 2024-02-13 DIAGNOSIS — R569 Unspecified convulsions: Secondary | ICD-10-CM | POA: Diagnosis not present

## 2024-02-13 DIAGNOSIS — G9340 Encephalopathy, unspecified: Secondary | ICD-10-CM | POA: Insufficient documentation

## 2024-02-13 DIAGNOSIS — I639 Cerebral infarction, unspecified: Secondary | ICD-10-CM | POA: Diagnosis not present

## 2024-02-13 LAB — RESP PANEL BY RT-PCR (RSV, FLU A&B, COVID)  RVPGX2
Influenza A by PCR: NEGATIVE
Influenza B by PCR: NEGATIVE
Resp Syncytial Virus by PCR: NEGATIVE
SARS Coronavirus 2 by RT PCR: NEGATIVE

## 2024-02-13 LAB — RESPIRATORY PANEL BY PCR

## 2024-02-13 LAB — CBC
HCT: 37 % — ABNORMAL LOW (ref 39.0–52.0)
HCT: 39.8 % (ref 39.0–52.0)
Hemoglobin: 12.4 g/dL — ABNORMAL LOW (ref 13.0–17.0)
Hemoglobin: 13.2 g/dL (ref 13.0–17.0)
MCH: 30.4 pg (ref 26.0–34.0)
MCH: 31.1 pg (ref 26.0–34.0)
MCHC: 33.2 g/dL (ref 30.0–36.0)
MCHC: 33.5 g/dL (ref 30.0–36.0)
MCV: 91.7 fL (ref 80.0–100.0)
MCV: 92.7 fL (ref 80.0–100.0)
Platelets: 165 K/uL (ref 150–400)
Platelets: 176 K/uL (ref 150–400)
RBC: 3.99 MIL/uL — ABNORMAL LOW (ref 4.22–5.81)
RBC: 4.34 MIL/uL (ref 4.22–5.81)
RDW: 14.6 % (ref 11.5–15.5)
RDW: 14.6 % (ref 11.5–15.5)
WBC: 13.7 K/uL — ABNORMAL HIGH (ref 4.0–10.5)
WBC: 14.8 K/uL — ABNORMAL HIGH (ref 4.0–10.5)
nRBC: 0 % (ref 0.0–0.2)
nRBC: 0 % (ref 0.0–0.2)

## 2024-02-13 LAB — HEMOGLOBIN A1C
Hgb A1c MFr Bld: 8 % — ABNORMAL HIGH (ref 4.8–5.6)
Mean Plasma Glucose: 182.9 mg/dL

## 2024-02-13 LAB — GLUCOSE, CAPILLARY
Glucose-Capillary: 388 mg/dL — ABNORMAL HIGH (ref 70–99)
Glucose-Capillary: 99 mg/dL (ref 70–99)
Glucose-Capillary: 175 mg/dL — ABNORMAL HIGH (ref 70–99)

## 2024-02-13 LAB — CREATININE, SERUM
Creatinine, Ser: 1.32 mg/dL — ABNORMAL HIGH (ref 0.61–1.24)
GFR, Estimated: 55 mL/min — ABNORMAL LOW

## 2024-02-13 LAB — LACTIC ACID, PLASMA
Lactic Acid, Venous: 1.2 mmol/L (ref 0.5–1.9)
Lactic Acid, Venous: 1.1 mmol/L (ref 0.5–1.9)

## 2024-02-13 LAB — HIV ANTIBODY (ROUTINE TESTING W REFLEX): HIV Screen 4th Generation wRfx: NONREACTIVE

## 2024-02-13 MED ORDER — SODIUM CHLORIDE 0.9 % IV SOLN: 2.0000 g | Freq: Once | INTRAVENOUS | Status: DC

## 2024-02-13 MED ORDER — LACTATED RINGERS IV SOLN
150.0000 mL/h | INTRAVENOUS | Status: DC
  Administered 2024-02-13 – 2024-02-14 (×3): 150 mL/h via INTRAVENOUS

## 2024-02-13 MED ORDER — ACETAMINOPHEN 650 MG RE SUPP: 650.0000 mg | Freq: Four times a day (QID) | RECTAL | Status: DC | PRN

## 2024-02-13 MED ORDER — SODIUM CHLORIDE 0.9 % IV SOLN
2.5000 mg/kg | Freq: Two times a day (BID) | INTRAVENOUS | Status: DC
  Administered 2024-02-13 – 2024-02-14 (×2): 195 mg via INTRAVENOUS
  Filled 2024-02-13 (×4): qty 3.9

## 2024-02-13 MED ORDER — HALOPERIDOL LACTATE 5 MG/ML IJ SOLN
5.0000 mg | Freq: Four times a day (QID) | INTRAMUSCULAR | Status: DC | PRN
  Administered 2024-02-13 – 2024-02-14 (×4): 5 mg via INTRAMUSCULAR
  Filled 2024-02-13 (×4): qty 1

## 2024-02-13 MED ORDER — ACETAMINOPHEN 325 MG PO TABS
650.0000 mg | ORAL_TABLET | Freq: Four times a day (QID) | ORAL | Status: DC | PRN
  Administered 2024-02-14 – 2024-02-15 (×2): 650 mg via ORAL
  Filled 2024-02-13 (×2): qty 2

## 2024-02-13 MED ORDER — ONDANSETRON HCL 4 MG PO TABS: 4.0000 mg | ORAL_TABLET | Freq: Four times a day (QID) | ORAL | Status: DC | PRN

## 2024-02-13 MED ORDER — ENOXAPARIN SODIUM 40 MG/0.4ML IJ SOSY
40.0000 mg | PREFILLED_SYRINGE | INTRAMUSCULAR | Status: DC
  Administered 2024-02-13 – 2024-02-15 (×3): 40 mg via SUBCUTANEOUS
  Filled 2024-02-13 (×3): qty 0.4

## 2024-02-13 MED ORDER — INSULIN ASPART 100 UNIT/ML IJ SOLN
0.0000 [IU] | Freq: Three times a day (TID) | INTRAMUSCULAR | Status: DC
  Administered 2024-02-13: 15 [IU] via SUBCUTANEOUS
  Administered 2024-02-14: 3 [IU] via SUBCUTANEOUS
  Administered 2024-02-15: 2 [IU] via SUBCUTANEOUS
  Administered 2024-02-15 (×2): 3 [IU] via SUBCUTANEOUS
  Administered 2024-02-16: 5 [IU] via SUBCUTANEOUS
  Filled 2024-02-13: qty 4
  Filled 2024-02-13: qty 3
  Filled 2024-02-13: qty 15
  Filled 2024-02-13: qty 2
  Filled 2024-02-13 (×2): qty 3

## 2024-02-13 MED ORDER — CHLORHEXIDINE GLUCONATE CLOTH 2 % EX PADS
6.0000 | MEDICATED_PAD | Freq: Every day | CUTANEOUS | Status: DC
  Administered 2024-02-13 – 2024-02-14 (×2): 6 via TOPICAL

## 2024-02-13 MED ORDER — LORAZEPAM 2 MG/ML IJ SOLN
2.0000 mg | Freq: Once | INTRAMUSCULAR | Status: AC
  Administered 2024-02-13: 2 mg via INTRAVENOUS
  Filled 2024-02-13: qty 1

## 2024-02-13 MED ORDER — VANCOMYCIN HCL IN DEXTROSE 1-5 GM/200ML-% IV SOLN: 1000.0000 mg | Freq: Once | INTRAVENOUS | Status: DC

## 2024-02-13 MED ORDER — INSULIN ASPART 100 UNIT/ML IJ SOLN: 0.0000 [IU] | Freq: Every day | INTRAMUSCULAR | Status: DC

## 2024-02-13 MED ORDER — METRONIDAZOLE 500 MG/100ML IV SOLN: 500.0000 mg | Freq: Two times a day (BID) | INTRAVENOUS | Status: DC

## 2024-02-13 MED ORDER — HALOPERIDOL LACTATE 5 MG/ML IJ SOLN
5.0000 mg | Freq: Once | INTRAMUSCULAR | Status: AC | PRN
  Administered 2024-02-13: 5 mg via INTRAVENOUS
  Filled 2024-02-13: qty 1

## 2024-02-13 MED ORDER — ONDANSETRON HCL 4 MG/2ML IJ SOLN: 4.0000 mg | Freq: Four times a day (QID) | INTRAMUSCULAR | Status: DC | PRN

## 2024-02-13 NOTE — ED Notes (Signed)
 Patient is increasingly agitated at this time, MD made aware that NV restraint order, PRN medication for agitation order, and patient observation order will be needed at this time. Will plan to give IM haldol  when ordered and verified by pharmacy. This RN is observing and attempting to redirect patient at bedside at this time. Patient appears not oriented at any level at this time as he does not respond to orientation questions though he is alert and only speaks nonsensically. Family remains at bedside.

## 2024-02-13 NOTE — Consult Note (Signed)
 "                                                                  [facility]                                                                                        Patient Identification: Patient Name: Scott Price MRN: [id number] Admit Date: 02/12/2024  3:14 PM Today's Date: 02/13/2024 Reason for consult encephalopathy, CSF with positive HHV-6 Requesting provider: Dr. Maranda  Principal Problem:   Sepsis [facility]) Active Problems:   Pacemaker   Hypertension   Type II diabetes mellitus (HCC)   Iron deficiency anemia   Hyperlipidemia   Acute metabolic encephalopathy   Antibiotics:  Acyclovir  1/9- Ceftriaxone  1/9 Meropenem  1/9- Vancomycin  1/9-  Lines/Hardware: PPM +  Assessment # Encephalopathy of unclear cause, concerns for HHV6 meningitis, encephalitis  - Has a remote history of use of benralizumab  (monoclonal ab) seems to have stopped sometime in March 2024.  Otherwise not a typical immunocompromised host for HHV-6 encephalitis/meningitis like stem cell recipients - CSF analysis with only 1-2 WBCs, 13/4 RBC glucose 114 ( serum glucose 262), and protein 54. Does not seem to be consistent with viral or bacterial meningitis.  ME panel positive for HHV-6 - Concerns for septic/metabolic encephalopathy   # Leukocytosis # AKI   Recommendations  - Continue vancomycin , pharmacy to dose and meropenem  for possible sepsis of unclear cause - I do not think this is primarily HHV-6 encephalitis/meningitis. However, I will change IV acyclovir  to IV ganciclovir  for the time being pending further infective work up - will need MRI brain wwo once can assure PPM compatible, I will order CT CAP wo ( AKI) - will add 2 more sets of blood cx, Resp panel with influenza A,B, SARSCOV2, RSV and RVP full panel, HIV  - fu blood cultures, CSF cx, EEG, neurology recs.  - Monitor CBC and  - Droplet precautions  D/w primary team  Following   Rest of the management as per the  primary team. Please call with questions or concerns.  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 March 2024, followed by Pulmonary who was transferred from [facility] hospital as a code stroke.  Patient was working and drove himself home, feeling nauseous and later family noted him to be confused. Had fevers. Per daughter, he was recently started on Ozempic, took second dose on January 2 and later stopped after visit to [place] [facility] and restarted on metformin . [place].  Lives with his wife.  Denies any recent sick contacts or travel.  Denies any pets at home.    At  ED febrile Labs remarkable for creatinine 1.49, BG 262, WBC 14.9 UA more than 500 glucose, 20 ketones, negative leukocytes, negative nitrite, rare bacteria 0-5 RBC, 0-5 WBC 1/9 blood cx pending  1/9 LP with glucose 114, protein 54, RBC 13/4, WBC 2/1. ME panel + for HHV 6  ROS: unable to obtain at this time.   Past Medical History:  Diagnosis Date   Diabetes mellitus without complication (HCC)    High cholesterol    Past Surgical History:  Procedure Laterality Date   BACK SURGERY     LEFT HEART CATH AND CORONARY ANGIOGRAPHY N/A 11/22/2019   Procedure: LEFT HEART CATH AND CORONARY ANGIOGRAPHY;  Surgeon: Dann Candyce RAMAN, MD;  Location: [facility] INVASIVE CV LAB;  Service: Cardiovascular;  Laterality: N/A;   NECK SURGERY     PACEMAKER IMPLANT N/A 12/26/2019   Procedure: PACEMAKER IMPLANT;  Surgeon: Waddell Danelle ORN, MD;  Location: [facility] INVASIVE CV LAB;  Service: Cardiovascular;  Laterality: N/A;   Scheduled Meds:  levETIRAcetam   500 mg Intravenous Q12H   Continuous Infusions:  sodium chloride  50  mL/hr at 02/12/24 1900   acyclovir      meropenem  (MERREM ) IV Stopped (02/12/24 2213)   vancomycin      PRN Meds:.haloperidol  lactate, hydrALAZINE   Allergies[1]  Social History   Socioeconomic History   Marital status: Married    Spouse name: Not on file   Number of children: Not on file   Years of education: Not on file   Highest education level: Not on file  Occupational History   Not on file  Tobacco Use   Smoking status: Former    Current packs/day: 0.00    Types: Cigarettes    Quit date: 2000    Years since quitting: 26.0   Smokeless tobacco: Never  Substance and Sexual Activity   Alcohol use: Not Currently   Drug use: Yes    Types: Marijuana   Sexual activity: Not on file  Other Topics Concern   Not on file  Social History Narrative   Not on file   Social Drivers of Health   Tobacco Use: Medium Risk (02/12/2024)   Patient History    Smoking Tobacco Use: Former    Smokeless Tobacco Use: Never    Passive Exposure: Not on Actuary Strain: Not on file  Food Insecurity: Not on file  Transportation Needs: Not on file  Physical Activity: Not on file  Stress: Not on file  Social Connections: Unknown (05/11/2022)   Received from [facility]   Social Network    Social Network: Not on file  Intimate Partner Violence: Unknown (05/11/2022)   Received from [facility]   HITS    Physically Hurt: Not on file    Insult or Talk Down To: Not on file    Threaten Physical Harm: Not on file    Scream or Curse: Not on file  Depression (PHQ2-9): Not on file  Alcohol Screen: Not on file  Housing: Not on file  Utilities: Not on file  Health Literacy: Not on file   Family History  Problem Relation Age of Onset   CAD Brother    CAD Brother    Vitals BP (!) 129/58   Pulse 89   Temp 97.6 F (36.4 C) (Oral)   Resp (!) 22   Ht 5' 9 (1.753 m)   Wt 78.7 kg   SpO2 100%   BMI 25.64 kg/m     Physical Exam Constitutional: Elderly male lying in the bed,  ill-appearing, agitated, confused, opens eyes    Comments: Mucosa moist  Cardiovascular:     Rate and Rhythm: Normal rate    Heart sounds: S1 and S2  Pulmonary:     Effort: Pulmonary effort is normal.     Comments: Normal breath sounds  Abdominal:     Palpations: Abdomen is soft.     Tenderness: Nondistended and nontender  Musculoskeletal:        General: No swelling or tenderness in basal joints  Skin:    Comments: No obvious rashes or wounds  Neurological:     General: as above, moves all extremities spontaneously   Pertinent Microbiology Results for orders placed or performed during the hospital encounter of 02/12/24  CSF culture     Status: None (Preliminary result)   Collection Time: 02/12/24  6:55 PM   Specimen: CSF; Cerebrospinal Fluid  Result Value Ref Range Status   Specimen Description CSF  Final   Special Requests Normal  Final   Gram Stain   Final    WBC PRESENT, PREDOMINANTLY MONONUCLEAR NO ORGANISMS SEEN CYTOSPIN SMEAR Performed at [facility], [address], [place], [place] [address]    Culture PENDING  Incomplete   Report Status PENDING  Incomplete    Pertinent Lab seen by me:    Latest Ref Rng & Units 02/12/2024    7:22 PM 02/12/2024    3:44 PM 12/04/2023   11:12 AM  CBC  WBC 4.0 - 10.5 K/uL  14.9  8.0   Hemoglobin 13.0 - 17.0 g/dL 85.6  85.9  86.3   Hematocrit 39.0 - 52.0 % 42.0  42.9  40.1   Platelets 150 - 400 K/uL  207  203.0       Latest Ref Rng & Units 02/12/2024    7:22 PM 02/12/2024    3:44 PM 12/04/2023   11:12 AM  CMP  Glucose 70 - 99 mg/dL 798  737  875   BUN 8 - 23 mg/dL 25  21  15    Creatinine 0.61 - 1.24 mg/dL 8.49  8.50  8.94   Sodium 135 - 145 mmol/L 139  140  139   Potassium 3.5 - 5.1 mmol/L 4.5  4.1  4.0   Chloride 98 - 111 mmol/L 104  96  101   CO2 22 - 32 mmol/L  12  28   Calcium  8.9 - 10.3 mg/dL  89.2  9.7   Total Protein 6.5 - 8.1 g/dL  7.6  7.1   Total Bilirubin 0.0 - 1.2 mg/dL  1.0  0.5   Alkaline Phos 38  - 126 U/L  70  58   AST 15 - 41 U/L  33  27   ALT 0 - 44 U/L  39  29      Pertinent Imagings/Other Imagings Plain films and CT images have been personally visualized and interpreted; radiology reports have been reviewed. Decision making incorporated into the Impression / Recommendations.  DG CHEST PORT 1 VIEW Result Date: 02/12/2024 EXAM: 1 VIEW(S) XRAY OF THE CHEST 02/12/2024 09:01:06 PM COMPARISON: 12/27/2019 CLINICAL HISTORY: AMS (altered mental status) FINDINGS: Patient is rotated. LINES, TUBES AND DEVICES: Left chest wall dual lead pacemaker noted. LUNGS AND PLEURA: No focal pulmonary opacity. No pleural effusion. No pneumothorax. HEART AND MEDIASTINUM: No acute abnormality of the cardiac and mediastinal silhouettes. Left chest wall dual lead pacemaker noted. Atherosclerotic plaque noted. BONES AND SOFT TISSUES: No acute osseous abnormality. Cervical spine surgical hardware noted. IMPRESSION: 1.  No acute cardiopulmonary findings. Electronically signed by: Morgane Naveau MD MD 02/12/2024 09:18 PM EST RP Workstation: [id number]   CT HEAD CODE STROKE WO CONTRAST (LKW 0-4.5h, LVO 0-24h) Result Date: 02/12/2024 EXAM: CT HEAD WITHOUT CONTRAST 02/12/2024 03:33:18 PM TECHNIQUE: CT of the head was performed without the administration of intravenous contrast. Automated exposure control, iterative reconstruction, and/or weight based adjustment of the mA/kV was utilized to reduce the radiation dose to as low as reasonably achievable. COMPARISON: None available. CLINICAL HISTORY: Neuro deficit, acute, stroke suspected. FINDINGS: BRAIN AND VENTRICLES: No acute hemorrhage. No evidence of acute infarct. No hydrocephalus. No extra-axial collection. No mass effect or midline shift. Atherosclerotic calcifications within cavernous and supraclinoid internal carotid arteries. [place] Stroke Program Early CT (ASPECT) score: Ganglionic (caudate, internal capsule, lentiform nucleus, insula, M1-M3): 7 Supraganglionic (M4-M6):  3 Total: 10 ORBITS: No acute abnormality. SINUSES: No acute abnormality. SOFT TISSUES AND SKULL: No acute soft tissue abnormality. No skull fracture. IMPRESSION: 1. No acute intracranial abnormality. 2. ASPECTS 10. 3. Findings messaged to Dr. Lindzen at 3:42 PM on 02/12/24. Electronically signed by: Donnice Mania MD MD 02/12/2024 03:42 PM EST RP Workstation: [id number]   CUP PACEART REMOTE DEVICE CHECK Result Date: 02/01/2024 PPM Scheduled remote reviewed. Normal device function.  Presenting rhythm: AS-VP.  1 AHR lasting 1 second.  2 device classified NSVT, longest 16 beats. Next remote transmission per protocol. - CS, CVRS  I personally spent a total of 81  minutes in the care of the patient today including preparing to see the patient, getting/reviewing separately obtained history, performing a medically appropriate exam/evaluation, counseling and educating, placing orders, referring and communicating with other health care professionals, documenting clinical information in the EHR, independently interpreting results, communicating results, and coordinating care.   Annalee Orem, MD Infectious Disease Physician [facility] for Infectious Disease Pager: [phone]      [1]  Allergies Allergen Reactions   Doxycycline Rash, Shortness Of Breath, Dermatitis and Nausea And Vomiting   Prednisone Other (See Comments)    High blood sugar   Sulfa Antibiotics Rash and Other (See Comments)    Headache/ Chest tightness/ stiff neck   Advair Hfa [Fluticasone -Salmeterol] Other (See Comments)    Thrush and sore throat   Pirfenidone  Itching, Rash and Other (See Comments)    Other Reaction(s): Lip swelling, Itching, Erythema, Lip swelling, Itching, Erythema   Amoxicillin Rash   Furosemide  Dermatitis and Rash   Gabapentin Nausea And Vomiting   Lisinopril  Cough   Ofev  [Nintedanib] Nausea And Vomiting   "

## 2024-02-13 NOTE — Progress Notes (Signed)
 MB called again and Pt is just now being received at 5W04. Spoke to Nurse and Pt. Is not available, they are just receiving him and have to get him settled. Unclear, but Pt was not successful in MRI. Pt continues to be agitated, in 4 point restraints, flailing arms, and thrashing upper body and head like he was earlier. Notified Neurology. We will continue to update and attempt f/u as schedule permits.

## 2024-02-13 NOTE — Progress Notes (Addendum)
 NEUROLOGY CONSULT FOLLOW UP NOTE   Date of service: February 13, 2024 Patient Name: Scott Price MRN:  [id number] DOB:  [birth year]  Interval Hx/subjective    No family at the bedside. Sitter at the bedside. Patient is in bilateral wrist restraints and mittens. Patient is restless. He is able to state his name and follow simple commands today.  Still waiting for MRI brain and EEG   Vitals   Vitals:   02/13/24 0430 02/13/24 0700 02/13/24 0924 02/13/24 1119  BP: (!) 151/68 (!) 129/58  (!) 119/48  Pulse: (!) 110 89  80  Resp: (!) 45 (!) 22  16  Temp:   99.2 F (37.3 C)   TempSrc:   Axillary   SpO2: 95% 100%  97%  Weight:      Height:         Body mass index is 25.64 kg/m.  Physical Exam   Constitutional: critically ill elderly confused male.  Psych: Affect appropriate to situation.   Eyes: No scleral injection.   HENT: No OP obstrucion.   Head: Normocephalic.   Cardiovascular: Normal rate and regular rhythm.   Respiratory: Effort normal, non-labored breathing.   GI: Soft.  No distension. There is no tenderness.   Skin: WDI.    Neurologic Examination   Mental Status -  He is restless and drowsy. He is able to state his name, and follow simple one step commands. Poor attention and concentration   Cranial Nerves II - XII - II - Visual field intact OU . III, IV, VI - Extraocular movements intact . V - Facial sensation intact bilaterally . VII - Facial movement intact bilaterally . VIII - Hearing & vestibular intact bilaterally . X - Palate elevates symmetrically . XI - Chin turning & shoulder shrug intact bilaterally . XII - Tongue protrusion intact .  Motor Strength - all 4 extremities are equal, purposeful and antigravity   Bulk was normal and fasciculations were absent .   Motor Tone - Muscle tone was assessed at the neck and appendages and was normal . Sensory - responds to noxious stimuli  Coordination - unable to assess   Gait and Station -  deferred.  Medications Current Medications[1]  Labs and Diagnostic Imaging   CBC:  Recent Labs  Lab 02/12/24 1544 02/12/24 1922 02/13/24 1136  WBC 14.9*  --  13.7*  NEUTROABS 12.4*  --   --   HGB 14.0 14.3 13.2  HCT 42.9 42.0 39.8  MCV 92.9  --  91.7  PLT 207  --  176    Basic Metabolic Panel:  Lab Results  Component Value Date   NA 139 02/12/2024   K 4.5 02/12/2024   CO2 12 (L) 02/12/2024   GLUCOSE 201 (H) 02/12/2024   BUN 25 (H) 02/12/2024   CREATININE 1.32 (H) 02/13/2024   CALCIUM  10.7 (H) 02/12/2024   GFRNONAA 55 (L) 02/13/2024   GFRAA 65 12/14/2019   Lipid Panel:  Lab Results  Component Value Date   LDLCALC 29 04/30/2021   HgbA1c:  Lab Results  Component Value Date   HGBA1C 8.0 (H) 02/13/2024   Urine Drug Screen:     Component Value Date/Time   LABOPIA NEGATIVE 02/12/2024 2200   COCAINSCRNUR NEGATIVE 02/12/2024 2200   LABBENZ POSITIVE (A) 02/12/2024 2200   AMPHETMU NEGATIVE 02/12/2024 2200   THCU POSITIVE (A) 02/12/2024 2200   LABBARB NEGATIVE 02/12/2024 2200    Alcohol Level     Component Value Date/Time   ETH <  15 02/12/2024 1544   INR  Lab Results  Component Value Date   INR 1.0 02/12/2024   APTT  Lab Results  Component Value Date   APTT 27 02/12/2024   AED levels: No results found for: PHENYTOIN, ZONISAMIDE, LAMOTRIGINE, LEVETIRACETA  CT Head without contrast(Personally reviewed):  No acute process    cEEG:  Ordered   Assessment   Scott Price is a 79 y.o. male  DM, HTN, HLD CAD, pulmonary fibrosis who presented to [facility] for evaluation of nausea after work and then while speaking to his wife became aphasic. EMS noted twitching of his right arm and foot. CT head at OSH with no acute process. He was given 2000mg  IV Keppra  at [facility]. He is noted to have a temp of 101.3 in [facility] and a leukocytosis of 14.9 @ [facility]. UA has been sent.   LP done in the ED:  CSF labs: Protein 54, glucose 114, cell count RBC/WBC 13/2 in tube 1, 4/1 in  tube 4, ME panel + HSV 6  UA negative UDS benzos, THC WBC 13.7 A1c 8 Blood Cx NGTD  CXR negative   Recommendations   - Seizure precautions  - LTM EEG to evaluate for seizure- ordered  - Keppra  500mg  IV BID  - empiric antibiotic coverage for meningitis with Vanc, meropenem  and acyclovir   - MRI brain w/o to evaluate for acute process when able. He does have a PPM and unsure of compatibility with MRI, if compatible get MRI when patient able  - Neurology will continue to follow  ______________________________________________________________________   Signed, Karna DELENA Geralds, NP [facility]  ATTENDING ATTESTATION: Still agitated.  Multiple attempts to get get ABG started but was eventually started and afternoon.  Continue to monitor.  No significant change in his mental status.  Infectious disease consult is appreciated.  Unclear if he to get MRI due to pacemaker.  Continue Keppra  500 mg twice daily.  Dr. Nichola evaluated pt independently, reviewed imaging, chart, labs. Discussed and formulated plan with the Resident/[facility]. Changes were made to the note where appropriate. Please see APP/resident note above for details.   Total 36 minutes spent on counseling patient and coordinating care, writing notes and reviewing chart.   Mohammedali Bedoy,MD      [1]  Current Facility-Administered Medications:    0.9 %  sodium chloride  infusion, , Intravenous, Continuous, Cleotilde Rogue, MD, Last Rate: 50 mL/hr at 02/12/24 1900, New Bag at 02/12/24 1900   acetaminophen  (TYLENOL ) tablet 650 mg, 650 mg, Oral, Q6H PRN **OR** acetaminophen  (TYLENOL ) suppository 650 mg, 650 mg, Rectal, Q6H PRN, Sim, Mohammad L, MD   acyclovir  (ZOVIRAX ) 785 mg in dextrose  5 % 250 mL IVPB, 10 mg/kg, Intravenous, Q12H, Sim Emery LITTIE, MD, Stopped at 02/13/24 1023   Chlorhexidine  Gluconate Cloth 2 % PADS 6 each, 6 each, Topical, Daily, Maranda Lonni MATSU, MD   enoxaparin  (LOVENOX ) injection 40 mg, 40 mg,  Subcutaneous, Q24H, Garba, Mohammad L, MD, 40 mg at 02/13/24 1203   haloperidol  lactate (HALDOL ) injection 5 mg, 5 mg, Intramuscular, Q6H PRN, Maranda Lonni MATSU, MD, 5 mg at 02/13/24 0755   hydrALAZINE  (APRESOLINE ) injection 10 mg, 10 mg, Intravenous, Q6H PRN, Garba, Mohammad L, MD   insulin  aspart (novoLOG ) injection 0-15 Units, 0-15 Units, Subcutaneous, TID WC, Garba, Mohammad L, MD, 15 Units at 02/13/24 1202   insulin  aspart (novoLOG ) injection 0-5 Units, 0-5 Units, Subcutaneous, QHS, Garba, Mohammad L, MD   lactated ringers  infusion, 150 mL/hr, Intravenous, Continuous, Sim, [place]  L, MD, Last Rate: 150 mL/hr at 02/13/24 1133, 150 mL/hr at 02/13/24 1133   levETIRAcetam  (KEPPRA ) undiluted injection 500 mg, 500 mg, Intravenous, Q12H, Sim, Mohammad L, MD, 500 mg at 02/13/24 0414   meropenem  (MERREM ) 2 g in sodium chloride  0.9 % 100 mL IVPB, 2 g, Intravenous, Q12H, Sim Emery CROME, MD, Stopped at 02/13/24 1129   ondansetron  (ZOFRAN ) tablet 4 mg, 4 mg, Oral, Q6H PRN **OR** ondansetron  (ZOFRAN ) injection 4 mg, 4 mg, Intravenous, Q6H PRN, Garba, Mohammad L, MD   vancomycin  (VANCOCIN ) IVPB 1000 mg/200 mL premix, 1,000 mg, Intravenous, Q24H, Sim Emery CROME, MD

## 2024-02-13 NOTE — Progress Notes (Signed)
 EEG tech arrived to patient's room to re-attempt overnight EEG setup. Based on patient behavior nurse and EEG tech agreed that 2 techs and meds are needed. Will try again later when both EEG techs are available.

## 2024-02-13 NOTE — Progress Notes (Signed)
 " PROGRESS NOTE    Scott Price  FMW:[id number] DOB: [birth year] DOA: 02/12/2024 PCP: [facility]  Subjective: Patient seen while EEG tech was seen electrodes.  Patient was nonverbal.  He did open his eyes and try to verbalize but did not follow commands.  No family at the bedside.    Hospital Course: 02/13/24: Had LP.  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  ID has seen the patient and does not convinced that this is the culprit for his current situation.  Workup ongoing.  Neurology following as well.   Assessment and Plan:   #1 sepsis of unknown source: Patient meets sepsis criteria with leukocytosis, fever, as well as evidence of possible infection.  Suspected meningitis most likely aseptic meningitis.  No reported viral illness noted by family prior to onset.  Patient will be admitted to a monitored bed.  Neurology to follow.  EEG being done.  Patient was loaded with Keppra  for possible seizure.  Also on acyclovir , vancomycin , cefepime as well as Flagyl  for sepsis of unknown source.  Blood cultures as well as LP studies will be followed.   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  Veins on meningitis doses of antibiotics and acyclovir  to be changed to ganciclovir    #3 acute metabolic encephalopathy: Patient has no evidence of CVA.  Workup will continue per neurology.  When stable may get MRI of the brain.   #4 non-insulin -dependent diabetes: Initiate sliding scale insulin .   #5 essential hypertension: For now blood pressure appears stable.  IV hydralazine  ordered as needed   #6 hyperlipidemia: Holding statin until patient can take orally   #7 hypercalcemia: Probably due to dehydration.  We will hydrate and monitor calcium  level.   #8 AKI: Appears to have some elements of CKD.  Will continue to monitor   #9 sick sinus syndrome: Patient has a pacemaker in place.  Continue to monitor      Advance Care Planning:   Code Status: Full Code     Consults:  Dr. Merrianne, neurologist  Dr. Berneda, infectious disease      DVT prophylaxis: enoxaparin  (LOVENOX ) injection 40 mg Start: 02/13/24 1114    Objective: Vitals:   02/13/24 0430 02/13/24 0700 02/13/24 0924 02/13/24 1119  BP: (!) 151/68 (!) 129/58  (!) 119/48  Pulse: (!) 110 89  80  Resp: (!) 45 (!) 22  16  Temp:   99.2 F (37.3 C)   TempSrc:   Axillary   SpO2: 95% 100%  97%  Weight:      Height:        Intake/Output Summary (Last 24 hours) at 02/13/2024 1434 Last data filed at 02/13/2024 1133 Gross per 24 hour  Intake 250 ml  Output 1000 ml  Net -750 ml   Filed Weights   02/12/24 1826  Weight: 78.7 kg    Examination: Patient lying the bed.  Initially eyes were closed.  Opened eyes to voice and light touch.  Gaze is disconjugate.  No nystagmus.  No scleral icterus.  Unable to assess for meningismus given EEG tech placing leads on the scalp. Cardiovascular: Regular rate and rhythm S1-S2 no murmurs rubs gallops Lungs: Clear but would not follow commands for deep breathing Abdomen: Soft, nontender, nondistended bowel sounds present Neurologic: Very lethargic not following commands spontaneously moved his upper extremities slightly agitated Extremities: No edema   Data Reviewed: I have personally reviewed following labs and imaging studies  CBC: Recent Labs  Lab 02/12/24 1544 02/12/24 1922 02/13/24 1136  WBC 14.9*  --  13.7*  NEUTROABS 12.4*  --   --   HGB 14.0 14.3 13.2  HCT 42.9 42.0 39.8  MCV 92.9  --  91.7  PLT 207  --  176   Basic Metabolic Panel: Recent Labs  Lab 02/12/24 1544 02/12/24 1922 02/13/24 1136  NA 140 139  --   K 4.1 4.5  --   CL 96* 104  --   CO2 12*  --   --   GLUCOSE 262* 201*  --   BUN 21 25*  --   CREATININE 1.49* 1.50* 1.32*  CALCIUM  10.7*  --   --    GFR: Estimated Creatinine Clearance: 45.4 mL/min (A) (by C-G formula based on SCr of 1.32 mg/dL (H)). Liver Function Tests: Recent Labs  Lab 02/12/24 1544   AST 33  ALT 39  ALKPHOS 70  BILITOT 1.0  PROT 7.6  ALBUMIN 4.8   No results for input(s): LIPASE, AMYLASE in the last 168 hours. No results for input(s): AMMONIA in the last 168 hours. Coagulation Profile: Recent Labs  Lab 02/12/24 1544  INR 1.0   Cardiac Enzymes: No results for input(s): CKTOTAL, CKMB, CKMBINDEX, TROPONINI in the last 168 hours. ProBNP, BNP (last 5 results) Recent Labs    12/04/23 1112  PROBNP 9.0   HbA1C: Recent Labs    02/13/24 1136  HGBA1C 8.0*   CBG: Recent Labs  Lab 02/12/24 1521 02/13/24 1142  GLUCAP 186* 388*   Lipid Profile: No results for input(s): CHOL, HDL, LDLCALC, TRIG, CHOLHDL, LDLDIRECT in the last 72 hours. Thyroid Function Tests: No results for input(s): TSH, T4TOTAL, FREET4, T3FREE, THYROIDAB in the last 72 hours. Anemia Panel: No results for input(s): VITAMINB12, FOLATE, FERRITIN, TIBC, IRON, RETICCTPCT in the last 72 hours. Sepsis Labs: Recent Labs  Lab 02/13/24 1136  LATICACIDVEN 1.2    Recent Results (from the past 240 hours)  Blood culture (routine x 2)     Status: None (Preliminary result)   Collection Time: 02/12/24  6:15 PM   Specimen: BLOOD RIGHT ARM  Result Value Ref Range Status   Specimen Description BLOOD RIGHT ARM  Final   Special Requests   Final    BOTTLES DRAWN AEROBIC AND ANAEROBIC Blood Culture results may not be optimal due to an inadequate volume of blood received in culture bottles   Culture   Final    NO GROWTH < 12 HOURS Performed at [facility], [address] [place], [place] [address]    Report Status PENDING  Incomplete  CSF culture     Status: None (Preliminary result)   Collection Time: 02/12/24  6:55 PM   Specimen: CSF; Cerebrospinal Fluid  Result Value Ref Range Status   Specimen Description CSF  Final   Special Requests Normal  Final   Gram Stain   Final    WBC PRESENT, PREDOMINANTLY MONONUCLEAR NO ORGANISMS SEEN CYTOSPIN  SMEAR    Culture   Final    NO GROWTH < 24 HOURS Performed at [facility], [address], [place], [place] [address]    Report Status PENDING  Incomplete     Radiology Studies: DG CHEST PORT 1 VIEW Result Date: 02/12/2024 EXAM: 1 VIEW(S) XRAY OF THE CHEST 02/12/2024 09:01:06 PM COMPARISON: 12/27/2019 CLINICAL HISTORY: AMS (altered mental status) FINDINGS: Patient is rotated. LINES, TUBES AND DEVICES: Left chest wall dual lead pacemaker noted. LUNGS AND PLEURA: No focal pulmonary  opacity. No pleural effusion. No pneumothorax. HEART AND MEDIASTINUM: No acute abnormality of the cardiac and mediastinal silhouettes. Left chest wall dual lead pacemaker noted. Atherosclerotic plaque noted. BONES AND SOFT TISSUES: No acute osseous abnormality. Cervical spine surgical hardware noted. IMPRESSION: 1. No acute cardiopulmonary findings. Electronically signed by: Morgane Naveau MD MD 02/12/2024 09:18 PM EST RP Workstation: [id number]   CT HEAD CODE STROKE WO CONTRAST (LKW 0-4.5h, LVO 0-24h) Result Date: 02/12/2024 EXAM: CT HEAD WITHOUT CONTRAST 02/12/2024 03:33:18 PM TECHNIQUE: CT of the head was performed without the administration of intravenous contrast. Automated exposure control, iterative reconstruction, and/or weight based adjustment of the mA/kV was utilized to reduce the radiation dose to as low as reasonably achievable. COMPARISON: None available. CLINICAL HISTORY: Neuro deficit, acute, stroke suspected. FINDINGS: BRAIN AND VENTRICLES: No acute hemorrhage. No evidence of acute infarct. No hydrocephalus. No extra-axial collection. No mass effect or midline shift. Atherosclerotic calcifications within cavernous and supraclinoid internal carotid arteries. [place] Stroke Program Early CT (ASPECT) score: Ganglionic (caudate, internal capsule, lentiform nucleus, insula, M1-M3): 7 Supraganglionic (M4-M6): 3 Total: 10 ORBITS: No acute abnormality. SINUSES: No acute abnormality. SOFT TISSUES AND SKULL: No  acute soft tissue abnormality. No skull fracture. IMPRESSION: 1. No acute intracranial abnormality. 2. ASPECTS 10. 3. Findings messaged to Dr. Lindzen at 3:42 PM on 02/12/24. Electronically signed by: Donnice Mania MD MD 02/12/2024 03:42 PM EST RP Workstation: [id number]    Scheduled Meds:  Chlorhexidine  Gluconate Cloth  6 each Topical Daily   enoxaparin  (LOVENOX ) injection  40 mg Subcutaneous Q24H   insulin  aspart  0-15 Units Subcutaneous TID WC   insulin  aspart  0-5 Units Subcutaneous QHS   levETIRAcetam   500 mg Intravenous Q12H   Continuous Infusions:  sodium chloride  50 mL/hr at 02/12/24 1900   acyclovir  Stopped (02/13/24 1023)   lactated ringers  150 mL/hr (02/13/24 1133)   meropenem  (MERREM ) IV Stopped (02/13/24 1129)   vancomycin        LOS: 1 day   Time spent 60  minutes  Lonni KANDICE Moose, MD  [facility]  02/13/2024, 2:34 PM   "

## 2024-02-13 NOTE — Progress Notes (Signed)
 Pharmacy Antibiotic Note  Scott Price is a 79 y.o. 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 Pharmacy has been consulted for ganciclovir  dosing. - CBC numerically stable (hemoglobin 13.2, platelets 176) - Renal function is numerically improving (Scr 1.32)   Plan: Ganciclovir  IV 195 mg (~2.5 mg/kg) every 12 hours Monitor clinical progression, renal function, CBC (hemoglobin, platelets)  Height: 5' 9 (175.3 cm) Weight: 78.7 kg (173 lb 9.6 oz) IBW/kg (Calculated) : 70.7  Temp (24hrs), Avg:99.4 F (37.4 C), Min:97.6 F (36.4 C), Max:101.3 F (38.5 C)  Recent Labs  Lab 02/12/24 1544 02/12/24 1922 02/13/24 1136  WBC 14.9*  --  13.7*  CREATININE 1.49* 1.50* 1.32*  LATICACIDVEN  --   --  1.2    Estimated Creatinine Clearance: 45.4 mL/min (A) (by C-G formula based on SCr of 1.32 mg/dL (H)).    Allergies[1]  Antimicrobials this admission: Acyclovir  1/9>>1/10 Ceftriaxone  1/9 x1 Meropenem  1/9>> Vancomycin  1/9>> Ganciclovir  1/10>>  Microbiology results: 1/9 Bcx = NGTD 1/9 CSF cx = NGTD 1/9 meningitis/encephalitis CSF panel = HHV-6(+) 1/10 respiratory panel = in process  Thank you for allowing pharmacy to be a part of this patients care.  Feliciano Close, PharmD PGY2 Infectious Diseases Pharmacy Resident      [1]  Allergies Allergen Reactions   Doxycycline Rash, Shortness Of Breath, Dermatitis and Nausea And Vomiting   Prednisone Other (See Comments)    High blood sugar   Sulfa Antibiotics Rash and Other (See Comments)    Headache/ Chest tightness/ stiff neck   Advair Hfa [Fluticasone -Salmeterol] Other (See Comments)    Thrush and sore throat   Pirfenidone  Itching, Rash and Other (See Comments)    Other Reaction(s): Lip swelling, Itching, Erythema, Lip swelling, Itching, Erythema   Amoxicillin Rash   Furosemide  Dermatitis and Rash   Gabapentin Nausea And Vomiting   Lisinopril  Cough   Ofev  [Nintedanib]  Nausea And Vomiting

## 2024-02-13 NOTE — Progress Notes (Signed)
 LTM VIDEO EEG hooked up and running - no initial skin breakdown - push button tested - [facility] is monitoring.

## 2024-02-14 ENCOUNTER — Inpatient Hospital Stay (HOSPITAL_COMMUNITY)

## 2024-02-14 DIAGNOSIS — E119 Type 2 diabetes mellitus without complications: Secondary | ICD-10-CM

## 2024-02-14 DIAGNOSIS — A419 Sepsis, unspecified organism: Secondary | ICD-10-CM

## 2024-02-14 DIAGNOSIS — Z950 Presence of cardiac pacemaker: Secondary | ICD-10-CM

## 2024-02-14 DIAGNOSIS — G9340 Encephalopathy, unspecified: Secondary | ICD-10-CM

## 2024-02-14 DIAGNOSIS — I495 Sick sinus syndrome: Secondary | ICD-10-CM | POA: Diagnosis not present

## 2024-02-14 DIAGNOSIS — R569 Unspecified convulsions: Secondary | ICD-10-CM | POA: Diagnosis not present

## 2024-02-14 DIAGNOSIS — D72829 Elevated white blood cell count, unspecified: Secondary | ICD-10-CM | POA: Diagnosis not present

## 2024-02-14 DIAGNOSIS — G039 Meningitis, unspecified: Secondary | ICD-10-CM | POA: Diagnosis not present

## 2024-02-14 DIAGNOSIS — R451 Restlessness and agitation: Secondary | ICD-10-CM | POA: Diagnosis not present

## 2024-02-14 LAB — COMPREHENSIVE METABOLIC PANEL WITH GFR
ALT: 29 U/L (ref 0–44)
ALT: 32 U/L (ref 0–44)
AST: 36 U/L (ref 15–41)
AST: 38 U/L (ref 15–41)
Albumin: 3.9 g/dL (ref 3.5–5.0)
Albumin: 4.2 g/dL (ref 3.5–5.0)
Alkaline Phosphatase: 60 U/L (ref 38–126)
Alkaline Phosphatase: 62 U/L (ref 38–126)
Anion gap: 17 — ABNORMAL HIGH (ref 5–15)
Anion gap: 22 — ABNORMAL HIGH (ref 5–15)
BUN: 18 mg/dL (ref 8–23)
BUN: 19 mg/dL (ref 8–23)
CO2: 14 mmol/L — ABNORMAL LOW (ref 22–32)
CO2: 19 mmol/L — ABNORMAL LOW (ref 22–32)
Calcium: 9.4 mg/dL (ref 8.9–10.3)
Calcium: 9.5 mg/dL (ref 8.9–10.3)
Chloride: 102 mmol/L (ref 98–111)
Chloride: 103 mmol/L (ref 98–111)
Creatinine, Ser: 0.95 mg/dL (ref 0.61–1.24)
Creatinine, Ser: 1.02 mg/dL (ref 0.61–1.24)
GFR, Estimated: >60 mL/min
GFR, Estimated: >60 mL/min
Glucose, Bld: 88 mg/dL (ref 70–99)
Glucose, Bld: 91 mg/dL (ref 70–99)
Potassium: 3.8 mmol/L (ref 3.5–5.1)
Potassium: 4.2 mmol/L (ref 3.5–5.1)
Sodium: 137 mmol/L (ref 135–145)
Sodium: 138 mmol/L (ref 135–145)
Total Bilirubin: 0.5 mg/dL (ref 0.0–1.2)
Total Bilirubin: 0.6 mg/dL (ref 0.0–1.2)
Total Protein: 6.4 g/dL — ABNORMAL LOW (ref 6.5–8.1)
Total Protein: 6.6 g/dL (ref 6.5–8.1)

## 2024-02-14 LAB — CBC WITH DIFFERENTIAL/PLATELET
Abs Immature Granulocytes: 0.06 K/uL (ref 0.00–0.07)
Basophils Absolute: 0.1 K/uL (ref 0.0–0.1)
Basophils Relative: 0 %
Eosinophils Absolute: 0.1 K/uL (ref 0.0–0.5)
Eosinophils Relative: 1 %
HCT: 36.7 % — ABNORMAL LOW (ref 39.0–52.0)
Hemoglobin: 11.9 g/dL — ABNORMAL LOW (ref 13.0–17.0)
Immature Granulocytes: 1 %
Lymphocytes Relative: 9 %
Lymphs Abs: 1.1 K/uL (ref 0.7–4.0)
MCH: 30.4 pg (ref 26.0–34.0)
MCHC: 32.4 g/dL (ref 30.0–36.0)
MCV: 93.6 fL (ref 80.0–100.0)
Monocytes Absolute: 1 K/uL (ref 0.1–1.0)
Monocytes Relative: 9 %
Neutro Abs: 9.8 K/uL — ABNORMAL HIGH (ref 1.7–7.7)
Neutrophils Relative %: 80 %
Platelets: 161 K/uL (ref 150–400)
RBC: 3.92 MIL/uL — ABNORMAL LOW (ref 4.22–5.81)
RDW: 14.6 % (ref 11.5–15.5)
WBC: 12.1 K/uL — ABNORMAL HIGH (ref 4.0–10.5)
nRBC: 0 % (ref 0.0–0.2)

## 2024-02-14 LAB — PROTIME-INR
INR: 1.1 (ref 0.8–1.2)
Prothrombin Time: 14.5 s (ref 11.4–15.2)

## 2024-02-14 LAB — GLUCOSE, CAPILLARY
Glucose-Capillary: 101 mg/dL — ABNORMAL HIGH (ref 70–99)
Glucose-Capillary: 78 mg/dL (ref 70–99)
Glucose-Capillary: 191 mg/dL — ABNORMAL HIGH (ref 70–99)
Glucose-Capillary: 168 mg/dL — ABNORMAL HIGH (ref 70–99)

## 2024-02-14 LAB — MRSA NEXT GEN BY PCR, NASAL: MRSA by PCR Next Gen: DETECTED — AB

## 2024-02-14 LAB — C-REACTIVE PROTEIN: CRP: 0.8 mg/dL

## 2024-02-14 LAB — PROCALCITONIN: Procalcitonin: 0.12 ng/mL

## 2024-02-14 LAB — PHOSPHORUS: Phosphorus: 2.9 mg/dL (ref 2.5–4.6)

## 2024-02-14 LAB — MAGNESIUM: Magnesium: 1.7 mg/dL (ref 1.7–2.4)

## 2024-02-14 LAB — CORTISOL-AM, BLOOD: Cortisol - AM: 21.8 ug/dL (ref 6.7–22.6)

## 2024-02-14 MED ORDER — VITAMIN D3 25 MCG (1000 UNIT) PO TABS
1000.0000 [IU] | ORAL_TABLET | Freq: Every day | ORAL | Status: DC
  Administered 2024-02-14 – 2024-02-16 (×3): 1000 [IU] via ORAL
  Filled 2024-02-14 (×6): qty 1

## 2024-02-14 MED ORDER — MELATONIN 3 MG PO TABS
3.0000 mg | ORAL_TABLET | Freq: Every evening | ORAL | Status: DC | PRN
  Administered 2024-02-14 – 2024-02-15 (×2): 3 mg via ORAL
  Filled 2024-02-14 (×2): qty 1

## 2024-02-14 MED ORDER — VITAMIN B-12 1000 MCG PO TABS
1000.0000 ug | ORAL_TABLET | Freq: Every day | ORAL | Status: DC
  Administered 2024-02-14 – 2024-02-16 (×3): 1000 ug via ORAL
  Filled 2024-02-14 (×3): qty 1

## 2024-02-14 MED ORDER — SODIUM CHLORIDE 0.9 % IV SOLN
2.0000 g | Freq: Three times a day (TID) | INTRAVENOUS | Status: DC
  Administered 2024-02-14 – 2024-02-15 (×2): 2 g via INTRAVENOUS
  Filled 2024-02-14 (×4): qty 40

## 2024-02-14 MED ORDER — PANTOPRAZOLE SODIUM 40 MG PO TBEC
40.0000 mg | DELAYED_RELEASE_TABLET | Freq: Every day | ORAL | Status: DC
  Administered 2024-02-14 – 2024-02-16 (×3): 40 mg via ORAL
  Filled 2024-02-14 (×3): qty 1

## 2024-02-14 MED ORDER — TAMSULOSIN HCL 0.4 MG PO CAPS
0.8000 mg | ORAL_CAPSULE | Freq: Every day | ORAL | Status: DC
  Administered 2024-02-14 – 2024-02-15 (×2): 0.8 mg via ORAL
  Filled 2024-02-14 (×2): qty 2

## 2024-02-14 MED ORDER — ASPIRIN 81 MG PO TBEC
81.0000 mg | DELAYED_RELEASE_TABLET | Freq: Every day | ORAL | Status: DC
  Administered 2024-02-14 – 2024-02-16 (×3): 81 mg via ORAL
  Filled 2024-02-14 (×3): qty 1

## 2024-02-14 MED ORDER — LACTATED RINGERS IV SOLN: INTRAVENOUS | Status: AC

## 2024-02-14 NOTE — Progress Notes (Signed)
 LTM EEG disconnected - no skin breakdown at [facility]. [facility] notified.

## 2024-02-14 NOTE — Procedures (Addendum)
 Patient Name: Scott Price  MRN: [id number]  Epilepsy Attending: Arlin MALVA Krebs  Referring Physician/Provider: Waddell Karna LABOR, NP  Duration: 02/13/2024 1500 to 02/14/2024 1040  Patient history: 79 y.o. male  who presented to [facility] for evaluation of nausea after work and then while speaking to his wife became aphasic. EMS noted twitching of his right arm and foot. EEG to evaluate for seizure.   Level of alertness: Awake, asleep  AEDs during EEG study: LEV  Technical aspects: This EEG study was done with scalp electrodes positioned according to the 10-20 International system of electrode placement. Electrical activity was reviewed with band pass filter of 1-70Hz , sensitivity of 7 uV/mm, display speed of 75mm/sec with a 60Hz  notched filter applied as appropriate. EEG data were recorded continuously and digitally stored.  Video monitoring was available and reviewed as appropriate.  Description: The posterior dominant rhythm consists of 8Hz  activity of moderate voltage (25-35 uV) seen predominantly in posterior head regions, symmetric and reactive to eye opening and eye closing. Sleep was characterized by vertex waves, sleep spindles (12 to 14 Hz), maximal frontocentral region. There is continuous 3 to 6 Hz theta-delta slowing in left hemisphere. Hyperventilation and photic stimulation were not performed.     ABNORMALITY - Continuous slow,  left hemisphere  IMPRESSION: This study is suggestive of cortical dysfunction arising from left hemisphere likely secondary to underlying structural abnormality, post ictal state. No seizures or epileptiform discharges were seen throughout the recording.  Alec Mcphee O Heywood Tokunaga

## 2024-02-14 NOTE — Plan of Care (Signed)

## 2024-02-14 NOTE — Progress Notes (Signed)
 "                                                                                                                                                                                                                                                                                PROGRESS NOTE     Patient Demographics:    Renso Swett, is a 79 y.o. male, DOB - [birth year], FMW:[id number]  Outpatient Primary MD for the patient is [facility]    LOS - 2  Admit date - 02/12/2024    Chief Complaint  Patient presents with   AMS   Code Stroke       Brief Narrative (HPI from H&P)    79 y.o. male with medical history significant of type 2 diabetes, essential hypertension, history of sick sinus syndrome with complete heart block status post pacemaker placement, coronary artery disease, iron deficiency anemia who was transferred from [facility] as a code stroke.  Patient is doing well apparently prior to all this.  He was working and drove himself home.  Patient was started on Ozempic last week.  First dose of this in the second of this week.  He started feeling bad and told his doctors.  He was taken off the Ozempic and told not to use it again.  Patient went back to taking his metformin  yesterday.  Today he came home and family noted he is confused.  His wife was right in front of him but he was calling her out.  Patient also with some fever.  They were worried and took him to the ER.  Patient was having acute metabolic encephalopathy of unknown cause.  Acute stroke suspected and a code stroke initiated.    Patient has been seen by neurology here.  No obvious neurologic deficit but he is agitated and confused.  He meets sepsis criteria with a temperature of 101.3, white count 14.9.  He is also having significant tachycardia.  No obvious source.  With altered mental status suspicion was for possible meningitis.  Lumbar puncture was performed and patient is being admitted to the hospital for further  evaluation and treatment.    Subjective:  Azai Gaffin today has, No headache, No chest pain, No abdominal pain - No Nausea, No new weakness tingling or numbness, no shortness of breath.   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  CT head unremarkable, EEG does not show any active seizures however some nonspecific findings were noted.  Neurology on board.  On IV Keppra  for seizures.  Mentation has improved, follow cultures, defer management of antibiotics and antiseizure medications to ID and neurology.  MRI brain pending, follow cultures.   #2 Suspected meningitis: See above   #3 Acute metabolic encephalopathy: Patient has no evidence of CVA.  Workup will continue per neurology.  When stable may get MRI of the brain.   #4 Non-insulin -dependent diabetes: Initiate sliding scale insulin .  Lab Results  Component Value Date   HGBA1C 8.0 (H) 02/13/2024   CBG (last 3)  Recent Labs    02/13/24 1614 02/13/24 2003 02/14/24 0742  GLUCAP 99 175* 101*      #5 essential hypertension: For now blood pressure appears stable.  IV hydralazine  ordered as needed   #6 hyperlipidemia: Resume statin once taking oral medications.   #7 hypercalcemia: Due to dehydration resolved after IV fluids   #8 AKI: Lying CKD 2, improved with IV fluids.   #9 Sick sinus syndrome: Patient has a pacemaker in place.  Continue to monitor        Condition - Fair  Family Communication  :    Wife and daughter Clotilda moles phone #[phone], wife's cell phone number is #[phone]  Code Status :  Full  Consults  :   ID, Neuro  PUD Prophylaxis :      Procedures  :     MRI -   CT - 1. No acute findings in the chest, abdomen, or pelvis. 2. Cholelithiasis without CT evidence of acute cholecystitis or biliary ductal dilation. 3. Severe distal colonic diverticulosis and scattered transverse colon diverticula without CT evidence of acute diverticulitis. 4. Extensive atherosclerotic calcification involving the lower extremity arterial inflow and outflow, with suspected hemodynamically significant stenoses on this noncontrast exam; if symptoms of lower extremity arterial insufficiency are present, CT arteriography may be helpful for further evaluation. 5. Extensive multivessel coronary artery calcification. 6. Calcification of the aortic valve leaflets. 7. Mild prostatic hypertrophy  EEG -  This study is suggestive of cortical dysfunction arising from left hemisphere likely secondary to underlying structural abnormality, post ictal state. No seizures or epileptiform discharges were seen throughout the recording  CT - 1. No acute intracranial abnormality. 2. ASPECTS 10. 3. Findings messaged to Dr. Lindzen at 3:42 PM on 02/12/24.       Disposition Plan  :    Status is: Inpatient   DVT Prophylaxis  :    enoxaparin  (LOVENOX ) injection 40 mg Start: 02/13/24 1114   Lab Results  Component Value Date   PLT 161 02/14/2024    Diet :  Diet Order             Diet heart healthy/carb modified Room service appropriate? Yes; Fluid consistency: Thin  Diet effective now  Inpatient Medications  Scheduled Meds:  Chlorhexidine  Gluconate Cloth  6 each Topical Daily   enoxaparin  (LOVENOX ) injection  40 mg Subcutaneous Q24H   insulin  aspart  0-15 Units Subcutaneous TID WC   insulin  aspart  0-5 Units Subcutaneous QHS   levETIRAcetam   500 mg Intravenous Q12H   Continuous Infusions:  ganciclovir  195 mg (02/13/24 1724)   lactated ringers      meropenem  (MERREM ) IV 2 g (02/13/24 2114)   vancomycin  1,000 mg (02/13/24 1932)    PRN Meds:.acetaminophen  **OR** acetaminophen , haloperidol  lactate, hydrALAZINE , ondansetron  **OR** ondansetron  (ZOFRAN ) IV  Antibiotics  :    Anti-infectives (From admission, onward)    Start     Dose/Rate Route Frequency Ordered Stop   02/13/24 2000  vancomycin  (VANCOCIN ) IVPB 1000 mg/200 mL premix        1,000 mg 200 mL/hr over 60 Minutes Intravenous Every 24 hours 02/12/24 2044     02/13/24 1615  ganciclovir  (CYTOVENE ) 195 mg in sodium chloride  0.9 % 100 mL IVPB        2.5 mg/kg  78.7 kg 100 mL/hr over 60 Minutes Intravenous Every 12 hours 02/13/24 1524     02/13/24 1200  ceFEPIme (MAXIPIME) 2 g in sodium chloride  0.9 % 100 mL IVPB  Status:  Discontinued        2 g 200 mL/hr over 30 Minutes Intravenous  Once 02/13/24 1114 02/13/24 1121   02/13/24 1200  vancomycin  (VANCOCIN ) IVPB 1000 mg/200 mL premix  Status:  Discontinued        1,000 mg 200 mL/hr over 60 Minutes Intravenous  Once 02/13/24 1114 02/13/24 1122   02/13/24 1114  metroNIDAZOLE  (FLAGYL ) IVPB 500 mg  Status:  Discontinued        500 mg 100 mL/hr over 60 Minutes Intravenous Every 12 hours 02/13/24 1114 02/13/24 1122   02/13/24 1000  acyclovir  (ZOVIRAX ) 785 mg in dextrose  5 % 250 mL IVPB  Status:  Discontinued        10 mg/kg  78.7 kg 265.7 mL/hr over 60 Minutes Intravenous Every 12 hours 02/12/24 2044 02/13/24 1522   02/12/24 2200  meropenem  (MERREM ) 2 g in sodium chloride  0.9 % 100 mL IVPB        2 g 280 mL/hr over 30 Minutes Intravenous Every 12 hours 02/12/24 2044     02/12/24 1830  cefTRIAXone  (ROCEPHIN ) 2 g in sodium chloride  0.9 % 100 mL IVPB        2 g 200 mL/hr over 30 Minutes Intravenous  Once 02/12/24 1817 02/12/24 1927   02/12/24 1830  vancomycin  (VANCOREADY) IVPB 1750 mg/350 mL        1,750 mg 175 mL/hr over 120 Minutes Intravenous  Once 02/12/24 1821 02/12/24 2138   02/12/24 1830  acyclovir  (ZOVIRAX ) 800 mg in dextrose  5 % 250 mL IVPB        10 mg/kg  80 kg (Order-Specific) 266 mL/hr over 60  Minutes Intravenous Once 02/12/24 1826 02/12/24 2131         Objective:   Vitals:   02/14/24 0000 02/14/24 0200 02/14/24 0400 02/14/24 0739  BP: (!) 140/50  (!) 140/46   Pulse: 72 66 73   Resp: 12 15 15    Temp: (!) 97.1 F (36.2 C)  (!) 97.2 F (36.2 C) (!) 97.5 F (36.4 C)  TempSrc: Axillary  Axillary Axillary  SpO2: 98% 98% 98%   Weight:      Height:        Wt Readings from Last 3  Encounters:  02/12/24 78.7 kg  12/04/23 76 kg  08/26/23 79 kg     Intake/Output Summary (Last 24 hours) at 02/14/2024 1015 Last data filed at 02/13/2024 1839 Gross per 24 hour  Intake 1406.92 ml  Output 1000 ml  Net 406.92 ml     Physical Exam  Awake Alert, No new F.N deficits, Normal affect [place].AT,PERRAL Supple Neck, No JVD,   Symmetrical Chest wall movement, Good air movement bilaterally, CTAB RRR,No Gallops,Rubs or new Murmurs,  +ve B.Sounds, Abd Soft, No tenderness,   No Cyanosis, Clubbing or edema        Data Review:    Recent Labs  Lab 02/12/24 1544 02/12/24 1922 02/13/24 1136 02/13/24 2335 02/14/24 0826  WBC 14.9*  --  13.7* 14.8* 12.1*  HGB 14.0 14.3 13.2 12.4* 11.9*  HCT 42.9 42.0 39.8 37.0* 36.7*  PLT 207  --  176 165 161  MCV 92.9  --  91.7 92.7 93.6  MCH 30.3  --  30.4 31.1 30.4  MCHC 32.6  --  33.2 33.5 32.4  RDW 14.0  --  14.6 14.6 14.6  LYMPHSABS 1.1  --   --   --  1.1  MONOABS 1.1*  --   --   --  1.0  EOSABS 0.1  --   --   --  0.1  BASOSABS 0.0  --   --   --  0.1    Recent Labs  Lab 02/12/24 1544 02/12/24 1922 02/13/24 1136 02/13/24 1522 02/13/24 2335 02/14/24 0826  NA 140 139  --   --  138 137  K 4.1 4.5  --   --  3.8 4.2  CL 96* 104  --   --  103 102  CO2 12*  --   --   --  19* 14*  ANIONGAP 32*  --   --   --  17* 22*  GLUCOSE 262* 201*  --   --  91 88  BUN 21 25*  --   --  19 18  CREATININE 1.49* 1.50* 1.32*  --  1.02 0.95  AST 33  --   --   --  36 38  ALT 39  --   --   --  32 29  ALKPHOS 70  --   --   --  60 62  BILITOT 1.0  --    --   --  0.6 0.5  ALBUMIN 4.8  --   --   --  4.2 3.9  CRP  --   --   --   --   --  0.8  PROCALCITON  --   --   --   --   --  0.12  LATICACIDVEN  --   --  1.2 1.1  --   --   INR 1.0  --   --   --  1.1  --   HGBA1C  --   --  8.0*  --   --   --   MG  --   --   --   --   --  1.7  PHOS  --   --   --   --   --  2.9  CALCIUM  10.7*  --   --   --  9.5 9.4      Recent Labs  Lab 02/12/24 1544 02/13/24 1136 02/13/24 1522 02/13/24 2335 02/14/24 0826  CRP  --   --   --   --  0.8  PROCALCITON  --   --   --   --  0.12  LATICACIDVEN  --  1.2 1.1  --   --   INR 1.0  --   --  1.1  --   HGBA1C  --  8.0*  --   --   --   MG  --   --   --   --  1.7  CALCIUM  10.7*  --   --  9.5 9.4    --------------------------------------------------------------------------------------------------------------- Lab Results  Component Value Date   CHOL 94 (L) 04/30/2021   HDL 39 (L) 04/30/2021   LDLCALC 29 04/30/2021   TRIG 154 (H) 04/30/2021   CHOLHDL 2.4 04/30/2021    Lab Results  Component Value Date   HGBA1C 8.0 (H) 02/13/2024   No results for input(s): TSH, T4TOTAL, FREET4, T3FREE, THYROIDAB in the last 72 hours. No results for input(s): VITAMINB12, FOLATE, FERRITIN, TIBC, IRON, RETICCTPCT in the last 72 hours. ------------------------------------------------------------------------------------------------------------------ Cardiac Enzymes No results for input(s): CKMB, TROPONINI, MYOGLOBIN in the last 168 hours.  Invalid input(s): CK  Micro Results Recent Results (from the past 240 hours)  Blood culture (routine x 2)     Status: None (Preliminary result)   Collection Time: 02/12/24  6:15 PM   Specimen: BLOOD RIGHT ARM  Result Value Ref Range Status   Specimen Description BLOOD RIGHT ARM  Final   Special Requests   Final    BOTTLES DRAWN AEROBIC AND ANAEROBIC Blood Culture results may not be optimal due to an inadequate volume of blood received in culture bottles    Culture   Final    NO GROWTH 2 DAYS Performed at [facility], [address], [place], [place] [address]    Report Status PENDING  Incomplete  CSF culture     Status: None (Preliminary result)   Collection Time: 02/12/24  6:55 PM   Specimen: CSF; Cerebrospinal Fluid  Result Value Ref Range Status   Specimen Description CSF  Final   Special Requests Normal  Final   Gram Stain   Final    WBC PRESENT, PREDOMINANTLY MONONUCLEAR NO ORGANISMS SEEN CYTOSPIN SMEAR    Culture   Final    NO GROWTH < 24 HOURS Performed at [facility], [address] [place], [place] [address]    Report Status PENDING  Incomplete  Resp panel by RT-PCR (RSV, Flu A&B, Covid) Anterior Nasal Swab     Status: None   Collection Time: 02/13/24  3:02 PM   Specimen: Anterior Nasal Swab  Result Value Ref Range Status   SARS Coronavirus 2 by RT PCR NEGATIVE NEGATIVE Final   Influenza A by PCR NEGATIVE NEGATIVE Final   Influenza B by PCR NEGATIVE NEGATIVE Final    Comment: (NOTE) The Xpert Xpress SARS-CoV-2/FLU/RSV plus assay is intended as an aid in the diagnosis of influenza from Nasopharyngeal swab specimens and should not be used as a sole basis for treatment. Nasal washings and aspirates are unacceptable for Xpert Xpress SARS-CoV-2/FLU/RSV testing.  Fact Sheet for Patients: bloggercourse.com  Fact Sheet for Healthcare Providers: seriousbroker.it  This test is not yet approved or cleared by the [place]  FDA and has been authorized for detection and/or diagnosis of SARS-CoV-2 by FDA under an Emergency Use Authorization (EUA). This EUA will remain in effect (meaning this test can be used) for the duration of the COVID-19 declaration under Section 564(b)(1) of the Act, 21 U.S.C. section 360bbb-3(b)(1), unless the authorization is terminated  or revoked.     Resp Syncytial Virus by PCR NEGATIVE NEGATIVE Final    Comment: (NOTE) Fact  Sheet for Patients: bloggercourse.com  Fact Sheet for Healthcare Providers: seriousbroker.it  This test is not yet approved or cleared by the [place]  FDA and has been authorized for detection and/or diagnosis of SARS-CoV-2 by FDA under an Emergency Use Authorization (EUA). This EUA will remain in effect (meaning this test can be used) for the duration of the COVID-19 declaration under Section 564(b)(1) of the Act, 21 U.S.C. section 360bbb-3(b)(1), unless the authorization is terminated or revoked.  Performed at [facility], [address] [place], [place] [address]   Respiratory (~20 pathogens) panel by PCR     Status: None   Collection Time: 02/13/24  3:02 PM   Specimen: Nasopharyngeal Swab; Respiratory  Result Value Ref Range Status   Adenovirus NOT DETECTED NOT DETECTED Final   Coronavirus 229E NOT DETECTED NOT DETECTED Final    Comment: (NOTE) The Coronavirus on the Respiratory Panel, DOES NOT test for the novel  Coronavirus (2019 nCoV)    Coronavirus HKU1 NOT DETECTED NOT DETECTED Final   Coronavirus NL63 NOT DETECTED NOT DETECTED Final   Coronavirus OC43 NOT DETECTED NOT DETECTED Final   Metapneumovirus NOT DETECTED NOT DETECTED Final   Rhinovirus / Enterovirus NOT DETECTED NOT DETECTED Final   Influenza A NOT DETECTED NOT DETECTED Final   Influenza B NOT DETECTED NOT DETECTED Final   Parainfluenza Virus 1 NOT DETECTED NOT DETECTED Final   Parainfluenza Virus 2 NOT DETECTED NOT DETECTED Final   Parainfluenza Virus 3 NOT DETECTED NOT DETECTED Final   Parainfluenza Virus 4 NOT DETECTED NOT DETECTED Final   Respiratory Syncytial Virus NOT DETECTED NOT DETECTED Final   Bordetella pertussis NOT DETECTED NOT DETECTED Final   Bordetella Parapertussis NOT DETECTED NOT DETECTED Final   Chlamydophila pneumoniae NOT DETECTED NOT DETECTED Final   Mycoplasma pneumoniae NOT DETECTED NOT DETECTED Final    Comment:  Performed at [facility], [address], [place], [place] [address]  Culture, blood (Routine X 2) w Reflex to ID Panel     Status: None (Preliminary result)   Collection Time: 02/13/24  3:22 PM   Specimen: BLOOD RIGHT HAND  Result Value Ref Range Status   Specimen Description BLOOD RIGHT HAND  Final   Special Requests   Final    BOTTLES DRAWN AEROBIC AND ANAEROBIC Blood Culture results may not be optimal due to an inadequate volume of blood received in culture bottles   Culture   Final    NO GROWTH < 24 HOURS Performed at [facility], [address] [place], [place] [address]    Report Status PENDING  Incomplete  Culture, blood (Routine X 2) w Reflex to ID Panel     Status: None (Preliminary result)   Collection Time: 02/13/24  3:22 PM   Specimen: BLOOD LEFT HAND  Result Value Ref Range Status   Specimen Description BLOOD LEFT HAND  Final   Special Requests   Final    BOTTLES DRAWN AEROBIC AND ANAEROBIC Blood Culture results may not be optimal due to an inadequate volume of blood received in culture bottles   Culture   Final    NO GROWTH < 24 HOURS Performed at [facility], [address] [place], [place] [address]    Report Status PENDING  Incomplete  MRSA Next Gen by PCR, Nasal     Status: Abnormal  Collection Time: 02/14/24  5:20 AM   Specimen: Nasal Mucosa; Nasal Swab  Result Value Ref Range Status   MRSA by PCR Next Gen DETECTED (A) NOT DETECTED Final    Comment: (NOTE) The GeneXpert MRSA Assay (FDA approved for NASAL specimens only), is one component of a comprehensive MRSA colonization surveillance program. It is not intended to diagnose MRSA infection nor to guide or monitor treatment for MRSA infections. Test performance is not FDA approved in patients less than 30 years old. Performed at [facility], [address] [place], [place] [address]     Radiology Report Overnight EEG with video Result Date: 02/14/2024 Shelton Arlin KIDD, MD      02/14/2024  7:46 AM Patient Name: Scott Price MRN: [id number] Epilepsy Attending: Arlin KIDD Shelton Referring Physician/Provider: Waddell Karna LABOR, NP Duration: 02/13/2024 1500 to 02/14/2024 0730 Patient history: 79 y.o. male  who presented to [facility] for evaluation of nausea after work and then while speaking to his wife became aphasic. EMS noted twitching of his right arm and foot. EEG to evaluate for seizure. Level of alertness: Awake, asleep AEDs during EEG study: LEV Technical aspects: This EEG study was done with scalp electrodes positioned according to the 10-20 International system of electrode placement. Electrical activity was reviewed with band pass filter of 1-70Hz , sensitivity of 7 uV/mm, display speed of 15mm/sec with a 60Hz  notched filter applied as appropriate. EEG data were recorded continuously and digitally stored.  Video monitoring was available and reviewed as appropriate. Description: The posterior dominant rhythm consists of 8Hz  activity of moderate voltage (25-35 uV) seen predominantly in posterior head regions, symmetric and reactive to eye opening and eye closing. Sleep was characterized by vertex waves, sleep spindles (12 to 14 Hz), maximal frontocentral region. There is continuous 3 to 6 Hz theta-delta slowing in left hemisphere. Hyperventilation and photic stimulation were not performed.   ABNORMALITY - Continuous slow,  left hemisphere IMPRESSION: This study is suggestive of cortical dysfunction arising from left hemisphere likely secondary to underlying structural abnormality, post ictal state. No seizures or epileptiform discharges were seen throughout the recording. Priyanka KIDD Shelton   CT CHEST ABDOMEN PELVIS WO CONTRAST Result Date: 02/13/2024 EXAM: CT CHEST, ABDOMEN AND PELVIS WITHOUT CONTRAST 02/13/2024 04:39:53 PM TECHNIQUE: CT of the chest, abdomen and pelvis was performed without the administration of intravenous contrast. Multiplanar reformatted images are provided for review.  Automated exposure control, iterative reconstruction, and/or weight based adjustment of the mA/kV was utilized to reduce the radiation dose to as low as reasonably achievable. COMPARISON: None available. CLINICAL HISTORY: Fevers or sepsis of unclear origin. FINDINGS: CHEST: MEDIASTINUM AND LYMPH NODES: Calcification of the aortic valve leaflets. Extensive multivessel coronary artery calcification. A left subclavian dual-lead pacemaker is identified with leads within the right atrium and right ventricle toward the inferior septum. Moderate atherosclerotic calcification within the thoracic aorta. The central airways are clear. No mediastinal, hilar or axillary lymphadenopathy. LUNGS AND PLEURA: Mild bilateral pulmonary dependent fibrotic change. No focal consolidation or pulmonary edema. No pleural effusion. No pneumothorax. ABDOMEN AND PELVIS: LIVER: Unremarkable. GALLBLADDER AND BILE DUCTS: Cholelithiasis without superimposed pericholecystic inflammatory change. No intra or extrahepatic biliary ductal dilation. SPLEEN: No acute abnormality. PANCREAS: No acute abnormality. ADRENAL GLANDS: No acute abnormality. KIDNEYS, URETERS AND BLADDER: Foley catheter balloon within a decompressed bladder lumen. No stones in the kidneys or ureters. No hydronephrosis. No perinephric or periureteral stranding. GI AND BOWEL: Stomach demonstrates no acute abnormality. Severe distal colonic diverticulosis. Scattered diverticula seen  throughout the transverse colon. No superimposed acute inflammatory change. Appendix normal. The small bowel and large bowel are otherwise unremarkable. There is no bowel obstruction. REPRODUCTIVE ORGANS: Mild prostatic hypertrophy. PERITONEUM AND RETROPERITONEUM: No ascites. No free air. VASCULATURE: Aorta is normal in caliber. Moderate atherosclerotic calcification within the thoracic aorta. Extend to the aortoiliac atherosclerotic calcification. Particularly prominent lower extremity arterial inflow and  outflow calcifications. All the degree of stenosis is not well assessed on this noncontrast examination, hemodynamically significant stenoses are suspected and correlation for lower extremity arterial insufficiency (rest pain, claudication) is recommended. If present, CT arteriography may be helpful for further evaluation. ABDOMINAL AND PELVIS LYMPH NODES: No lymphadenopathy. BONES AND SOFT TISSUES: Osseous structures are age appropriate. No acute bone abnormality. No lytic or blastic bone lesion. No focal soft tissue abnormality. IMPRESSION: 1. No acute findings in the chest, abdomen, or pelvis. 2. Cholelithiasis without CT evidence of acute cholecystitis or biliary ductal dilation. 3. Severe distal colonic diverticulosis and scattered transverse colon diverticula without CT evidence of acute diverticulitis. 4. Extensive atherosclerotic calcification involving the lower extremity arterial inflow and outflow, with suspected hemodynamically significant stenoses on this noncontrast exam; if symptoms of lower extremity arterial insufficiency are present, CT arteriography may be helpful for further evaluation. 5. Extensive multivessel coronary artery calcification. 6. Calcification of the aortic valve leaflets. 7. Mild prostatic hypertrophy. Electronically signed by: Dorethia Molt MD MD 02/13/2024 07:38 PM EST RP Workstation: [id number]   DG CHEST PORT 1 VIEW Result Date: 02/12/2024 EXAM: 1 VIEW(S) XRAY OF THE CHEST 02/12/2024 09:01:06 PM COMPARISON: 12/27/2019 CLINICAL HISTORY: AMS (altered mental status) FINDINGS: Patient is rotated. LINES, TUBES AND DEVICES: Left chest wall dual lead pacemaker noted. LUNGS AND PLEURA: No focal pulmonary opacity. No pleural effusion. No pneumothorax. HEART AND MEDIASTINUM: No acute abnormality of the cardiac and mediastinal silhouettes. Left chest wall dual lead pacemaker noted. Atherosclerotic plaque noted. BONES AND SOFT TISSUES: No acute osseous abnormality. Cervical spine  surgical hardware noted. IMPRESSION: 1. No acute cardiopulmonary findings. Electronically signed by: Morgane Naveau MD MD 02/12/2024 09:18 PM EST RP Workstation: [id number]   CT HEAD CODE STROKE WO CONTRAST (LKW 0-4.5h, LVO 0-24h) Result Date: 02/12/2024 EXAM: CT HEAD WITHOUT CONTRAST 02/12/2024 03:33:18 PM TECHNIQUE: CT of the head was performed without the administration of intravenous contrast. Automated exposure control, iterative reconstruction, and/or weight based adjustment of the mA/kV was utilized to reduce the radiation dose to as low as reasonably achievable. COMPARISON: None available. CLINICAL HISTORY: Neuro deficit, acute, stroke suspected. FINDINGS: BRAIN AND VENTRICLES: No acute hemorrhage. No evidence of acute infarct. No hydrocephalus. No extra-axial collection. No mass effect or midline shift. Atherosclerotic calcifications within cavernous and supraclinoid internal carotid arteries. [place] [facility] Early CT (ASPECT) score: Ganglionic (caudate, internal capsule, lentiform nucleus, insula, M1-M3): 7 Supraganglionic (M4-M6): 3 Total: 10 ORBITS: No acute abnormality. SINUSES: No acute abnormality. SOFT TISSUES AND SKULL: No acute soft tissue abnormality. No skull fracture. IMPRESSION: 1. No acute intracranial abnormality. 2. ASPECTS 10. 3. Findings messaged to Dr. Lindzen at 3:42 PM on 02/12/24. Electronically signed by: Donnice Mania MD MD 02/12/2024 03:42 PM EST RP Workstation: [id number]     Signature  -   Lavada Stank M.D on 02/14/2024 at 10:15 AM   -  To page go to [email or web address]   "

## 2024-02-14 NOTE — Progress Notes (Signed)
 PHARMACY NOTE:  ANTIMICROBIAL RENAL DOSAGE ADJUSTMENT  Current antimicrobial regimen includes a mismatch between antimicrobial dosage and estimated renal function.  As per policy approved by the Pharmacy & Therapeutics and Medical Executive Committees, the antimicrobial dosage will be adjusted accordingly.  Current antimicrobial dosage:  Meropenem  2g q12  Indication: Encephalopathy, meningitis w/u   Renal Function:  Estimated Creatinine Clearance: 63.1 mL/min (by C-G formula based on SCr of 0.95 mg/dL). []      On intermittent HD, scheduled: []      On CRRT    Antimicrobial dosage has been changed to:  Meropenem  2g q8   Additional comments: AKI resolved    Thank you for allowing pharmacy to be a part of this patient's care.  Dionicia LITTIE Canavan, [facility] 02/14/2024 4:26 PM

## 2024-02-14 NOTE — Progress Notes (Signed)
 "                                                            [facility] Infectious Diseases Follow Up Note  Patient Identification: Patient Name: Scott Price MRN: [id number] Admit Date: 02/12/2024  3:14 PM Age: 79 y.o.Today's Date: 02/14/2024  Reason for Visit: Encephalopathy  Principal Problem:   Sepsis [facility]) Active Problems:   Pacemaker   Hypertension   Type II diabetes mellitus (HCC)   Iron deficiency anemia   Hyperlipidemia   Acute metabolic encephalopathy   Acute encephalopathy  Antibiotics:  Acyclovir  1/9-1/10, ganciclovir  1/10 Ceftriaxone  1/9 Meropenem  1/9- Vancomycin  1/9-   Lines/Hardware: PPM +  Interval Events: Afebrile Labs with WBC down to 12.1, hemoglobin 11.9 Blood cultures negative Respiratory viral panel negative HIV NR CT CAP unremarkable for infective etiology   Assessment 79 year old male with prior history as below including DM 2, HTN, 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 March 2024, followed by Pulmonary who was transferred from [facility] hospital as a code stroke.  Patient was working and drove himself home, feeling nauseous and later family noted him to be confused. Had fevers. Per daughter, he was recently started on Ozempic, took second dose on January 2 and later stopped after visit to [place] [facility] and was restarted on metformin .   # Encephalopathy of unclear cause, unlikely  HHV6 meningitis, encephalitis  - Has a remote history of use of benralizumab  (monoclonal ab) seems to have stopped sometime in March 2024 which is not a  high risk immunosuppressant for HHV6. Not a hematopoietic stem cell transplant recipient. - CSF analysis with only 1-2 WBCs, 13/4 RBC glucose 114 ( serum glucose 262), and protein 54. Does not seem to be consistent with viral or bacterial meningitis.  ME panel positive for HHV-6 - Concerns for septic/metabolic encephalopathy    # Leukocytosis- improving  # AKI - resolved  # DM2 # SSS  s/p PPM   Recommendations - Will DC ganciclovir  as HHV6 unlikely to be the cause for encephalopathy.  - Continue vancomycin , pharmacy to dose and meropenem  for today, and will reassess for de-escalation of antibiotics  - MRI brain to be done when able. - Monitor CBC, BMP and vancomycin  trough - Universal/standard aspiration precautions D/w primary team and Neurology  Dr Fleeta Rothman to follow from 1/12  Rest of the management as per the primary team. Thank you for the consult. Please page with pertinent questions or concerns.  ______________________________________________________________________ Subjective patient seen and examined at the bedside.  Significant improvement in mental status.  Vitals BP (!) 140/46 (BP Location: Left Wrist)   Pulse 73   Temp (!) 97.5 F (36.4 C) (Axillary)   Resp 15   Ht 5' 9 (1.753 m)   Wt 78.7 kg   SpO2 98%   BMI 25.64 kg/m     Physical Exam Constitutional: Lying in the bed, easily in the head    Comments: HEENT WNL, no meningismus  Cardiovascular:     Rate and Rhythm: Normal rate    Heart sounds:   Pulmonary:     Effort: Pulmonary effort is normal.     Comments:   Abdominal:     Palpations: Abdomen is non distended  Tenderness:   Musculoskeletal:        General: No swelling or tenderness.   Skin:    Comments: No rashes  Neurological:     General: awake, alert, oriented to place, person, month, year, follows commands appropriately but unable to tell why he is here.  Nonfocal exam  Psychiatric:        Mood and Affect: Mood normal.   Pertinent Microbiology Results for orders placed or performed during the hospital encounter of 02/12/24  Blood culture (routine x 2)     Status: None (Preliminary result)   Collection Time: 02/12/24  6:15 PM   Specimen: BLOOD RIGHT ARM  Result Value Ref Range Status   Specimen Description BLOOD RIGHT ARM  Final   Special Requests   Final    BOTTLES DRAWN AEROBIC AND ANAEROBIC Blood Culture  results may not be optimal due to an inadequate volume of blood received in culture bottles   Culture   Final    NO GROWTH < 12 HOURS Performed at [facility], [address], [place], [place] [address]    Report Status PENDING  Incomplete  CSF culture     Status: None (Preliminary result)   Collection Time: 02/12/24  6:55 PM   Specimen: CSF; Cerebrospinal Fluid  Result Value Ref Range Status   Specimen Description CSF  Final   Special Requests Normal  Final   Gram Stain   Final    WBC PRESENT, PREDOMINANTLY MONONUCLEAR NO ORGANISMS SEEN CYTOSPIN SMEAR    Culture   Final    NO GROWTH < 24 HOURS Performed at [facility], [address], [place], [place] [address]    Report Status PENDING  Incomplete  Resp panel by RT-PCR (RSV, Flu A&B, Covid) Anterior Nasal Swab     Status: None   Collection Time: 02/13/24  3:02 PM   Specimen: Anterior Nasal Swab  Result Value Ref Range Status   SARS Coronavirus 2 by RT PCR NEGATIVE NEGATIVE Final   Influenza A by PCR NEGATIVE NEGATIVE Final   Influenza B by PCR NEGATIVE NEGATIVE Final    Comment: (NOTE) The Xpert Xpress SARS-CoV-2/FLU/RSV plus assay is intended as an aid in the diagnosis of influenza from Nasopharyngeal swab specimens and should not be used as a sole basis for treatment. Nasal washings and aspirates are unacceptable for Xpert Xpress SARS-CoV-2/FLU/RSV testing.  Fact Sheet for Patients: bloggercourse.com  Fact Sheet for Healthcare Providers: seriousbroker.it  This test is not yet approved or cleared by the [place]  FDA and has been authorized for detection and/or diagnosis of SARS-CoV-2 by FDA under an Emergency Use Authorization (EUA). This EUA will remain in effect (meaning this test can be used) for the duration of the COVID-19 declaration under Section 564(b)(1) of the Act, 21 U.S.C. section 360bbb-3(b)(1), unless the authorization is terminated  or revoked.     Resp Syncytial Virus by PCR NEGATIVE NEGATIVE Final    Comment: (NOTE) Fact Sheet for Patients: bloggercourse.com  Fact Sheet for Healthcare Providers: seriousbroker.it  This test is not yet approved or cleared by the [place]  FDA and has been authorized for detection and/or diagnosis of SARS-CoV-2 by FDA under an Emergency Use Authorization (EUA). This EUA will remain in effect (meaning this test can be used) for the duration of the COVID-19 declaration under Section 564(b)(1) of the Act, 21 U.S.C. section 360bbb-3(b)(1), unless the authorization is terminated or revoked.  Performed at [facility], [address],  [place], [place] [address]   Respiratory (~20 pathogens) panel by PCR     Status: None   Collection Time: 02/13/24  3:02 PM   Specimen: Nasopharyngeal Swab; Respiratory  Result Value Ref Range Status   Adenovirus NOT DETECTED NOT DETECTED Final   Coronavirus 229E NOT DETECTED NOT DETECTED Final    Comment: (NOTE) The Coronavirus on the Respiratory Panel, DOES NOT test for the novel  Coronavirus (2019 nCoV)    Coronavirus HKU1 NOT DETECTED NOT DETECTED Final   Coronavirus NL63 NOT DETECTED NOT DETECTED Final   Coronavirus OC43 NOT DETECTED NOT DETECTED Final   Metapneumovirus NOT DETECTED NOT DETECTED Final   Rhinovirus / Enterovirus NOT DETECTED NOT DETECTED Final   Influenza A NOT DETECTED NOT DETECTED Final   Influenza B NOT DETECTED NOT DETECTED Final   Parainfluenza Virus 1 NOT DETECTED NOT DETECTED Final   Parainfluenza Virus 2 NOT DETECTED NOT DETECTED Final   Parainfluenza Virus 3 NOT DETECTED NOT DETECTED Final   Parainfluenza Virus 4 NOT DETECTED NOT DETECTED Final   Respiratory Syncytial Virus NOT DETECTED NOT DETECTED Final   Bordetella pertussis NOT DETECTED NOT DETECTED Final   Bordetella Parapertussis NOT DETECTED NOT DETECTED Final   Chlamydophila pneumoniae NOT  DETECTED NOT DETECTED Final   Mycoplasma pneumoniae NOT DETECTED NOT DETECTED Final    Comment: Performed at [facility], [address] [place], [place] [address]   Pertinent Lab.    Latest Ref Rng & Units 02/13/2024   11:35 PM 02/13/2024   11:36 AM 02/12/2024    7:22 PM  CBC  WBC 4.0 - 10.5 K/uL 14.8  13.7    Hemoglobin 13.0 - 17.0 g/dL 87.5  86.7  85.6   Hematocrit 39.0 - 52.0 % 37.0  39.8  42.0   Platelets 150 - 400 K/uL 165  176        Latest Ref Rng & Units 02/13/2024   11:35 PM 02/13/2024   11:36 AM 02/12/2024    7:22 PM  CMP  Glucose 70 - 99 mg/dL 91   798   BUN 8 - 23 mg/dL 19   25   Creatinine 9.38 - 1.24 mg/dL 8.97  8.67  8.49   Sodium 135 - 145 mmol/L 138   139   Potassium 3.5 - 5.1 mmol/L 3.8   4.5   Chloride 98 - 111 mmol/L 103   104   CO2 22 - 32 mmol/L 19     Calcium  8.9 - 10.3 mg/dL 9.5     Total Protein 6.5 - 8.1 g/dL 6.6     Total Bilirubin 0.0 - 1.2 mg/dL 0.6     Alkaline Phos 38 - 126 U/L 60     AST 15 - 41 U/L 36     ALT 0 - 44 U/L 32       Pertinent Imaging today Plain films and CT images have been personally visualized and interpreted; radiology reports have been reviewed. Decision making incorporated into the Impression /   Overnight EEG with video Result Date: 02/14/2024 Shelton Arlin KIDD, MD     02/14/2024  7:46 AM Patient Name: Scott Price MRN: [id number] Epilepsy Attending: Arlin KIDD Shelton Referring Physician/Provider: Waddell Karna LABOR, NP Duration: 02/13/2024 1500 to 02/14/2024 0730 Patient history: 79 y.o. male  who presented to [facility] for evaluation of nausea after work and then while speaking to his wife became aphasic. EMS noted twitching of his right arm and foot. EEG to evaluate for seizure.  Level of alertness: Awake, asleep AEDs during EEG study: LEV Technical aspects: This EEG study was done with scalp electrodes positioned according to the 10-20 International system of electrode placement. Electrical activity was reviewed with band pass filter  of 1-70Hz , sensitivity of 7 uV/mm, display speed of 75mm/sec with a 60Hz  notched filter applied as appropriate. EEG data were recorded continuously and digitally stored.  Video monitoring was available and reviewed as appropriate. Description: The posterior dominant rhythm consists of 8Hz  activity of moderate voltage (25-35 uV) seen predominantly in posterior head regions, symmetric and reactive to eye opening and eye closing. Sleep was characterized by vertex waves, sleep spindles (12 to 14 Hz), maximal frontocentral region. There is continuous 3 to 6 Hz theta-delta slowing in left hemisphere. Hyperventilation and photic stimulation were not performed.   ABNORMALITY - Continuous slow,  left hemisphere IMPRESSION: This study is suggestive of cortical dysfunction arising from left hemisphere likely secondary to underlying structural abnormality, post ictal state. No seizures or epileptiform discharges were seen throughout the recording. Priyanka MALVA Krebs   CT CHEST ABDOMEN PELVIS WO CONTRAST Result Date: 02/13/2024 EXAM: CT CHEST, ABDOMEN AND PELVIS WITHOUT CONTRAST 02/13/2024 04:39:53 PM TECHNIQUE: CT of the chest, abdomen and pelvis was performed without the administration of intravenous contrast. Multiplanar reformatted images are provided for review. Automated exposure control, iterative reconstruction, and/or weight based adjustment of the mA/kV was utilized to reduce the radiation dose to as low as reasonably achievable. COMPARISON: None available. CLINICAL HISTORY: Fevers or sepsis of unclear origin. FINDINGS: CHEST: MEDIASTINUM AND LYMPH NODES: Calcification of the aortic valve leaflets. Extensive multivessel coronary artery calcification. A left subclavian dual-lead pacemaker is identified with leads within the right atrium and right ventricle toward the inferior septum. Moderate atherosclerotic calcification within the thoracic aorta. The central airways are clear. No mediastinal, hilar or axillary  lymphadenopathy. LUNGS AND PLEURA: Mild bilateral pulmonary dependent fibrotic change. No focal consolidation or pulmonary edema. No pleural effusion. No pneumothorax. ABDOMEN AND PELVIS: LIVER: Unremarkable. GALLBLADDER AND BILE DUCTS: Cholelithiasis without superimposed pericholecystic inflammatory change. No intra or extrahepatic biliary ductal dilation. SPLEEN: No acute abnormality. PANCREAS: No acute abnormality. ADRENAL GLANDS: No acute abnormality. KIDNEYS, URETERS AND BLADDER: Foley catheter balloon within a decompressed bladder lumen. No stones in the kidneys or ureters. No hydronephrosis. No perinephric or periureteral stranding. GI AND BOWEL: Stomach demonstrates no acute abnormality. Severe distal colonic diverticulosis. Scattered diverticula seen throughout the transverse colon. No superimposed acute inflammatory change. Appendix normal. The small bowel and large bowel are otherwise unremarkable. There is no bowel obstruction. REPRODUCTIVE ORGANS: Mild prostatic hypertrophy. PERITONEUM AND RETROPERITONEUM: No ascites. No free air. VASCULATURE: Aorta is normal in caliber. Moderate atherosclerotic calcification within the thoracic aorta. Extend to the aortoiliac atherosclerotic calcification. Particularly prominent lower extremity arterial inflow and outflow calcifications. All the degree of stenosis is not well assessed on this noncontrast examination, hemodynamically significant stenoses are suspected and correlation for lower extremity arterial insufficiency (rest pain, claudication) is recommended. If present, CT arteriography may be helpful for further evaluation. ABDOMINAL AND PELVIS LYMPH NODES: No lymphadenopathy. BONES AND SOFT TISSUES: Osseous structures are age appropriate. No acute bone abnormality. No lytic or blastic bone lesion. No focal soft tissue abnormality. IMPRESSION: 1. No acute findings in the chest, abdomen, or pelvis. 2. Cholelithiasis without CT evidence of acute cholecystitis or  biliary ductal dilation. 3. Severe distal colonic diverticulosis and scattered transverse colon diverticula without CT evidence of acute diverticulitis. 4. Extensive atherosclerotic calcification involving the lower extremity arterial inflow  and outflow, with suspected hemodynamically significant stenoses on this noncontrast exam; if symptoms of lower extremity arterial insufficiency are present, CT arteriography may be helpful for further evaluation. 5. Extensive multivessel coronary artery calcification. 6. Calcification of the aortic valve leaflets. 7. Mild prostatic hypertrophy. Electronically signed by: Dorethia Molt MD MD 02/13/2024 07:38 PM EST RP Workstation: [id number]   I personally spent a total of 51 minutes in the care of the patient today including preparing to see the patient, getting/reviewing separately obtained history, performing a medically appropriate exam/evaluation, placing orders, referring and communicating with other health care professionals, documenting clinical information in the EHR, independently interpreting results, and coordinating care.  Electronically signed by:   Annalee Orem, MD Infectious Disease Physician [facility] for Infectious Disease Pager: [phone]  "

## 2024-02-14 NOTE — Progress Notes (Addendum)
 NEUROLOGY CONSULT FOLLOW UP NOTE   Date of service: February 14, 2024 Patient Name: Scott Price MRN:  [id number] DOB:  11-17-1945  Interval Hx/subjective   No family at the bedside. Patient is laying in bed in NAD. He is calm and cooperative today. Mental status is much improved this am he is oriented to person, place, age and month. He complains of back pain   Vitals   Vitals:   02/14/24 0000 02/14/24 0200 02/14/24 0400 02/14/24 0739  BP: (!) 140/50  (!) 140/46   Pulse: 72 66 73   Resp: 12 15 15    Temp: (!) 97.1 F (36.2 C)  (!) 97.2 F (36.2 C) (!) 97.5 F (36.4 C)  TempSrc: Axillary  Axillary Axillary  SpO2: 98% 98% 98%   Weight:      Height:         Body mass index is 25.64 kg/m.  Physical Exam   Constitutional: critically ill elderly confused male.  Psych: Affect appropriate to situation.   Eyes: No scleral injection.   HENT: No OP obstrucion.   Head: Normocephalic.   Cardiovascular: Normal rate and regular rhythm.   Respiratory: Effort normal, non-labored breathing.   GI: Soft.  No distension. There is no tenderness.   Skin: WDI.    Neurologic Examination   Mental Status -  Awake and alert, oriented to self, age, month, place, unaware of situation. Follows commands. No aphasia or dysarthria noted   Cranial Nerves II - XII - II - Visual field intact OU . III, IV, VI - Extraocular movements intact . V - Facial sensation intact bilaterally . VII - Facial movement intact bilaterally . VIII - Hearing & vestibular intact bilaterally . X - Palate elevates symmetrically . XI - Chin turning & shoulder shrug intact bilaterally . XII - Tongue protrusion intact .  Motor Strength - all 4 extremities are equal, purposeful and antigravity   Bulk was normal and fasciculations were absent .   Motor Tone - Muscle tone was assessed at the neck and appendages and was normal . Sensory -intact to light touch bilaterally  Coordination - unable to assess   Gait and Station -  deferred.  Medications Current Medications[1]  Labs and Diagnostic Imaging   CBC:  Recent Labs  Lab 02/12/24 1544 02/12/24 1922 02/13/24 2335 02/14/24 0826  WBC 14.9*   < > 14.8* 12.1*  NEUTROABS 12.4*  --   --  9.8*  HGB 14.0   < > 12.4* 11.9*  HCT 42.9   < > 37.0* 36.7*  MCV 92.9   < > 92.7 93.6  PLT 207   < > 165 161   < > = values in this interval not displayed.    Basic Metabolic Panel:  Lab Results  Component Value Date   NA 138 02/13/2024   K 3.8 02/13/2024   CO2 19 (L) 02/13/2024   GLUCOSE 91 02/13/2024   BUN 19 02/13/2024   CREATININE 1.02 02/13/2024   CALCIUM  9.5 02/13/2024   GFRNONAA >60 02/13/2024   GFRAA 65 12/14/2019   Lipid Panel:  Lab Results  Component Value Date   LDLCALC 29 04/30/2021   HgbA1c:  Lab Results  Component Value Date   HGBA1C 8.0 (H) 02/13/2024   Urine Drug Screen:     Component Value Date/Time   LABOPIA NEGATIVE 02/12/2024 2200   COCAINSCRNUR NEGATIVE 02/12/2024 2200   LABBENZ POSITIVE (A) 02/12/2024 2200   AMPHETMU NEGATIVE 02/12/2024 2200   THCU POSITIVE (A)  02/12/2024 2200   LABBARB NEGATIVE 02/12/2024 2200    Alcohol Level     Component Value Date/Time   [facility] <15 02/12/2024 1544   INR  Lab Results  Component Value Date   INR 1.1 02/13/2024   APTT  Lab Results  Component Value Date   APTT 27 02/12/2024   AED levels: No results found for: PHENYTOIN, ZONISAMIDE, LAMOTRIGINE, LEVETIRACETA  CT Head without contrast(Personally reviewed):  No acute process    cEEG 1/10-1/11:  - Continuous slow,  left hemisphere IMPRESSION: This study is suggestive of cortical dysfunction arising from left hemisphere likely secondary to underlying structural abnormality, post ictal state. No seizures or epileptiform discharges were seen throughout the recording.    Assessment   BROCKTON MCKESSON is a 79 y.o. male  DM, HTN, HLD CAD, pulmonary fibrosis who presented to [facility] for evaluation of nausea after work and then while  speaking to his wife became aphasic. EMS noted twitching of his right arm and foot. CT head at OSH with no acute process. He was given 2000mg  IV Keppra  at [facility]. He is noted to have a temp of 101.3 in [facility] and a leukocytosis of 14.9 @ [facility]. UA has been sent.   LP done in the ED:  CSF labs: Protein 54, glucose 114, cell count RBC/WBC 13/2 in tube 1, 4/1 in tube 4, ME panel + HSV 6  UA negative UDS benzos, THC WBC 13.7-12.1 A1c 8 Blood Cx NGTD  CXR negative   LTM 1/10-1/11 continuous slowing, no seizures identified   Recommendations   - Seizure precautions  - Delirium precautions  - will discontinue LTM today  - Keppra  500mg  IV BID  - ID following, appreciate assistance  - antibiotic coverage for meningitis with Vanc, meropenem  and ganciclovir  (acyclovir  d/c per ID ) - MRI brain w/o to evaluate for acute process when able. He does have a PPM and unsure of compatibility with MRI, if compatible get MRI when patient able  - Neurology will continue to follow  ______________________________________________________________________   Signed, Karna DELENA Geralds, NP [facility]  ATTENDING ATTESTATION:  Doing better today.  Less agitated.  Continue to follow ID recommendations on antibiotics.  ID does not feel HSV- 6 is the cause of encephalopathy.  Ganciclovir  stopped.   brain MRI when able without contrast will be helpful  Dr. Nichola evaluated pt independently, reviewed imaging, chart, labs. Discussed and formulated plan with the Resident/APP. Changes were made to the note where appropriate. Please see APP/resident note above for details.   Total 36 minutes spent on counseling patient and coordinating care, writing notes and reviewing chart.       Antonina Deziel,MD        [1]  Current Facility-Administered Medications:    acetaminophen  (TYLENOL ) tablet 650 mg, 650 mg, Oral, Q6H PRN, 650 mg at 02/14/24 0216 **OR** acetaminophen  (TYLENOL ) suppository 650 mg, 650 mg, Rectal,  Q6H PRN, Sim, Mohammad L, MD   Chlorhexidine  Gluconate Cloth 2 % PADS 6 each, 6 each, Topical, Daily, Maranda Lonni MATSU, MD, 6 each at 02/13/24 1348   enoxaparin  (LOVENOX ) injection 40 mg, 40 mg, Subcutaneous, Q24H, Garba, Mohammad L, MD, 40 mg at 02/13/24 1203   ganciclovir  (CYTOVENE ) 195 mg in sodium chloride  0.9 % 100 mL IVPB, 2.5 mg/kg, Intravenous, Q12H, Manandhar, Sabina, MD, Last Rate: 100 mL/hr at 02/13/24 1724, 195 mg at 02/13/24 1724   haloperidol  lactate (HALDOL ) injection 5 mg, 5 mg, Intramuscular, Q6H PRN, Maranda Lonni MATSU, MD, 5 mg  at 02/14/24 0425   hydrALAZINE  (APRESOLINE ) injection 10 mg, 10 mg, Intravenous, Q6H PRN, Sim, Mohammad L, MD   insulin  aspart (novoLOG ) injection 0-15 Units, 0-15 Units, Subcutaneous, TID WC, Garba, Mohammad L, MD, 15 Units at 02/13/24 1202   insulin  aspart (novoLOG ) injection 0-5 Units, 0-5 Units, Subcutaneous, QHS, Garba, Mohammad L, MD   levETIRAcetam  (KEPPRA ) undiluted injection 500 mg, 500 mg, Intravenous, Q12H, Sim, Mohammad L, MD, 500 mg at 02/14/24 0348   meropenem  (MERREM ) 2 g in sodium chloride  0.9 % 100 mL IVPB, 2 g, Intravenous, Q12H, Sim Re L, MD, Last Rate: 280 mL/hr at 02/13/24 2114, 2 g at 02/13/24 2114   ondansetron  (ZOFRAN ) tablet 4 mg, 4 mg, Oral, Q6H PRN **OR** ondansetron  (ZOFRAN ) injection 4 mg, 4 mg, Intravenous, Q6H PRN, Sim, Mohammad L, MD   vancomycin  (VANCOCIN ) IVPB 1000 mg/200 mL premix, 1,000 mg, Intravenous, Q24H, Garba, Mohammad L, MD, Last Rate: 200 mL/hr at 02/13/24 1932, 1,000 mg at 02/13/24 1932

## 2024-02-14 NOTE — Evaluation (Signed)
 Physical Therapy Evaluation Patient Details Name: Scott Price MRN: [id number] DOB: 1945/05/31 Today's Date: 02/14/2024  History of Present Illness  79 year old male who was transferred from outside hospital as a code stroke.  Patient was working and drove himself home, feeling nauseous and later family noted him to be confused, and febrile; workup for sepsis with unclear sourse, Infectioust disease following; So far, imaging neg (MRI pending, for when pt able to toelrate), LP done. Per ID, meningitis unlikely, concerns for septic/metabolic encephalopathy. 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 PTA pt lived at home with wife, independent, working and driving. Pt currently with functional limitations due to the deficits listed below (see PT Problem List). On eval, pt required CGA transfers, and min assist amb 160' without AD. Unsteady gait, but improved with distance. VSS on RA. Mild confusion. No recall of events that brought him to the hospital. Pt will benefit from acute skilled PT to increase their independence and safety with mobility to allow discharge. Anticipate pt will progress quickly with mobility and require no follow up services. PT to follow acutely.          If plan is discharge home, recommend the following: Assistance with cooking/housework;Supervision due to cognitive status;Help with stairs or ramp for entrance   Can travel by private vehicle        Equipment Recommendations None recommended by PT  Recommendations for Other Services       Functional Status Assessment Patient has had a recent decline in their functional status and demonstrates the ability to make significant improvements in function in a reasonable and predictable amount of time.     Precautions / Restrictions Precautions Precautions: Fall Recall of Precautions/Restrictions: Impaired      Mobility  Bed Mobility                General bed mobility comments: Pt in recliner.    Transfers Overall transfer level: Needs assistance Equipment used: None Transfers: Sit to/from Stand Sit to Stand: Contact guard assist                Ambulation/Gait Ambulation/Gait assistance: Min assist Gait Distance (Feet): 160 Feet Assistive device: None Gait Pattern/deviations: Step-through pattern, Decreased stride length Gait velocity: mildly decreased Gait velocity interpretation: 1.31 - 2.62 ft/sec, indicative of limited community ambulator   General Gait Details: Unsteady, requiring min assist to maintain balance. Fatigued quickly. VSS on RA  Stairs            Wheelchair Mobility     Tilt Bed    Modified Rankin (Stroke Patients Only)       Balance Overall balance assessment: Needs assistance Sitting-balance support: Feet supported, No upper extremity supported Sitting balance-Leahy Scale: Good     Standing balance support: No upper extremity supported, During functional activity Standing balance-Leahy Scale: Fair                               Pertinent Vitals/Pain Pain Assessment Pain Assessment: No/denies pain    Home Living Family/patient expects to be discharged to:: Private residence Living Arrangements: Spouse/significant other Available Help at Discharge: Family Type of Home: House Home Access: Stairs to enter Entrance Stairs-Rails: Doctor, General Practice of Steps: 3-4   Home Layout: One level Home Equipment: None      Prior Function Prior Level of Function : Independent/Modified  Independent;Working/employed;Driving                     Extremity/Trunk Assessment   Upper Extremity Assessment Upper Extremity Assessment: Overall WFL for tasks assessed    Lower Extremity Assessment Lower Extremity Assessment: Overall WFL for tasks assessed    Cervical / Trunk Assessment Cervical / Trunk Assessment: Normal  Communication    Communication Communication: No apparent difficulties    Cognition Arousal: Alert Behavior During Therapy: WFL for tasks assessed/performed   PT - Cognitive impairments: Orientation, Awareness, Memory, Problem solving, Safety/Judgement   Orientation impairments: Time, Situation                   PT - Cognition Comments: no recall of events that brought him to the hospital Following commands: Intact       Cueing Cueing Techniques: Verbal cues     General Comments General comments (skin integrity, edema, etc.): VSS on RA    Exercises     Assessment/Plan    PT Assessment Patient needs continued PT services  PT Problem List Decreased mobility;Decreased safety awareness;Decreased activity tolerance;Decreased balance;Decreased knowledge of precautions       PT Treatment Interventions Therapeutic exercise;Gait training;Balance training;Stair training;Functional mobility training;Therapeutic activities;Patient/family education;Cognitive remediation    PT Goals (Current goals can be found in the Care Plan section)  Acute Rehab PT Goals Patient Stated Goal: home PT Goal Formulation: With patient Time For Goal Achievement: 02/28/24 Potential to Achieve Goals: Good    Frequency Min 2X/week     Co-evaluation               AM-PAC PT 6 Clicks Mobility  Outcome Measure Help needed turning from your back to your side while in a flat bed without using bedrails?: None Help needed moving from lying on your back to sitting on the side of a flat bed without using bedrails?: None Help needed moving to and from a bed to a chair (including a wheelchair)?: A Little Help needed standing up from a chair using your arms (e.g., wheelchair or bedside chair)?: A Little Help needed to walk in hospital room?: A Little Help needed climbing 3-5 steps with a railing? : A Little 6 Click Score: 20    End of Session Equipment Utilized During Treatment: Gait belt Activity Tolerance:  Patient tolerated treatment well Patient left: in chair;with call bell/phone within reach;with chair alarm set Nurse Communication: Mobility status PT Visit Diagnosis: Unsteadiness on feet (R26.81)    Time: 9765-9744 PT Time Calculation (min) (ACUTE ONLY): 21 min   Charges:   PT Evaluation $PT Eval Moderate Complexity: 1 Mod   PT General Charges $$ ACUTE PT VISIT: 1 Visit         Sari MATSU., PT  Office # [phone]   Erven Sari Shaker 02/14/2024, 3:06 PM

## 2024-02-14 NOTE — Evaluation (Signed)
 Clinical/Bedside Swallow Evaluation Patient Details  Name: Scott Price MRN: [id number] Date of Birth: 1946-01-11  Today's Date: 02/14/2024 Time: SLP Start Time (ACUTE ONLY): 1422 SLP Stop Time (ACUTE ONLY): 1440 SLP Time Calculation (min) (ACUTE ONLY): 18 min  Past Medical History:  Past Medical History:  Diagnosis Date   Diabetes mellitus without complication (HCC)    High cholesterol    Past Surgical History:  Past Surgical History:  Procedure Laterality Date   BACK SURGERY     LEFT HEART CATH AND CORONARY ANGIOGRAPHY N/A 11/22/2019   Procedure: LEFT HEART CATH AND CORONARY ANGIOGRAPHY;  Surgeon: Dann Candyce RAMAN, MD;  Location: [facility];  Service: Cardiovascular;  Laterality: N/A;   NECK SURGERY     PACEMAKER IMPLANT N/A 12/26/2019   Procedure: PACEMAKER IMPLANT;  Surgeon: Waddell Danelle ORN, MD;  Location: [facility] CV [facility];  Service: Cardiovascular;  Laterality: N/A;   HPI:  Scott Price is a 79 y.o. male who presented to [facility] as a transfer from [facility] from home on 02/12/24 as a code stroke. He was started on Ozempic the week prior to admission but he started feeling poorly and his doctor told him to stop taking it which he did and he returned to taking his Metformin . He came home from work on day of admission and family noticed he seemed confused. Family brought him to the ER. in ER, no obvious neurologic deficit but he was agitated and confused, meeting criteria for sepsis with temperature of 101.3, white count of 14.9. He was having signficant tachycardia. He was admitted with acute metabolic encephalopathy, possible seizures due to sepsis of unknown source. CT head unremarkable, EEG did not show seizures, MRI brain pending. 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    Assessment / Plan / Recommendation  Clinical Impression  Patient is not currently presenting with clinical s/s of dysphagia as per this bedside swallow evaluation. Swallow assessed via straw  sips of water with swallow initiation timely and no overt s/s aspiration. Patient indicated that his appetite has been off from his diabetes medication and I need to eat. (Lunch meal in room is untouched) He does not know what happened but does know he is at the hospital. SLP Visit Diagnosis: Dysphagia, unspecified (R13.10)    Aspiration Risk  No limitations    Diet Recommendation Regular;Thin liquid    Liquid Administration via: Cup;Straw Medication Administration: Other (Comment) (as tolerated) Compensations: Small sips/bites;Slow rate Postural Changes: Seated upright at 90 degrees    Other Recommendations Oral Care Recommendations: Oral care BID     Swallow Evaluation Recommendations     Assistance Recommended at Discharge    Functional Status Assessment Patient has not had a recent decline in their functional status  Frequency and Duration            Prognosis        Swallow Study   General Date of Onset: 02/14/24 HPI: Scott Price is a 79 y.o. male who presented to [facility] as a transfer from [facility] from home on 02/12/24 as a code stroke. He was started on Ozempic the week prior to admission but he started feeling poorly and his doctor told him to stop taking it which he did and he returned to taking his Metformin . He came home from work on day of admission and family noticed he seemed confused. Family brought him to the ER. in ER, no obvious neurologic deficit but he was agitated and confused, meeting  criteria for sepsis with temperature of 101.3, white count of 14.9. He was having signficant tachycardia. He was admitted with acute metabolic encephalopathy, possible seizures due to sepsis of unknown source. CT head unremarkable, EEG did not show seizures, MRI brain pending. 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 Type of Study: Bedside Swallow Evaluation Previous Swallow Assessment: none found Diet Prior to this Study: Regular;Thin liquids (Level 0) Temperature  Spikes Noted: No Respiratory Status: Room air History of Recent Intubation: No Behavior/Cognition: Alert;Pleasant mood;Cooperative Oral Cavity Assessment: Within Functional Limits Oral Care Completed by SLP: No Oral Cavity - Dentition: Adequate natural dentition Vision: Functional for self-feeding Self-Feeding Abilities: Able to feed self Patient Positioning: Upright in bed Baseline Vocal Quality: Normal Volitional Cough: Strong Volitional Swallow: Able to elicit    Oral/Motor/Sensory Function Overall Oral Motor/Sensory Function: Within functional limits   Ice Chips     Thin Liquid Thin Liquid: Within functional limits Presentation: Straw;Self Fed    Nectar Thick     Honey Thick     Puree Puree: Not tested   Solid     Solid: Not tested      Norleen IVAR Blase, MA, CCC-SLP Speech Therapy  02/14/2024,4:06 PM

## 2024-02-15 ENCOUNTER — Inpatient Hospital Stay (HOSPITAL_COMMUNITY)

## 2024-02-15 DIAGNOSIS — R4182 Altered mental status, unspecified: Secondary | ICD-10-CM | POA: Diagnosis not present

## 2024-02-15 DIAGNOSIS — R410 Disorientation, unspecified: Secondary | ICD-10-CM

## 2024-02-15 DIAGNOSIS — G039 Meningitis, unspecified: Secondary | ICD-10-CM | POA: Diagnosis not present

## 2024-02-15 DIAGNOSIS — A419 Sepsis, unspecified organism: Secondary | ICD-10-CM | POA: Diagnosis not present

## 2024-02-15 DIAGNOSIS — G0490 Encephalitis and encephalomyelitis, unspecified: Secondary | ICD-10-CM

## 2024-02-15 DIAGNOSIS — G9340 Encephalopathy, unspecified: Secondary | ICD-10-CM | POA: Diagnosis not present

## 2024-02-15 LAB — COMPREHENSIVE METABOLIC PANEL WITH GFR
ALT: 31 U/L (ref 0–44)
AST: 32 U/L (ref 15–41)
Albumin: 4.2 g/dL (ref 3.5–5.0)
Alkaline Phosphatase: 65 U/L (ref 38–126)
Anion gap: 16 — ABNORMAL HIGH (ref 5–15)
BUN: 20 mg/dL (ref 8–23)
CO2: 20 mmol/L — ABNORMAL LOW (ref 22–32)
Calcium: 9.9 mg/dL (ref 8.9–10.3)
Chloride: 100 mmol/L (ref 98–111)
Creatinine, Ser: 0.92 mg/dL (ref 0.61–1.24)
GFR, Estimated: >60 mL/min
Glucose, Bld: 137 mg/dL — ABNORMAL HIGH (ref 70–99)
Potassium: 4.1 mmol/L (ref 3.5–5.1)
Sodium: 136 mmol/L (ref 135–145)
Total Bilirubin: 0.6 mg/dL (ref 0.0–1.2)
Total Protein: 6.8 g/dL (ref 6.5–8.1)

## 2024-02-15 LAB — MAGNESIUM: Magnesium: 1.9 mg/dL (ref 1.7–2.4)

## 2024-02-15 LAB — CBC WITH DIFFERENTIAL/PLATELET
Abs Immature Granulocytes: 0.04 K/uL (ref 0.00–0.07)
Basophils Absolute: 0 K/uL (ref 0.0–0.1)
Basophils Relative: 0 %
Eosinophils Absolute: 0.2 K/uL (ref 0.0–0.5)
Eosinophils Relative: 2 %
HCT: 36.9 % — ABNORMAL LOW (ref 39.0–52.0)
Hemoglobin: 12.3 g/dL — ABNORMAL LOW (ref 13.0–17.0)
Immature Granulocytes: 0 %
Lymphocytes Relative: 12 %
Lymphs Abs: 1.1 K/uL (ref 0.7–4.0)
MCH: 30.3 pg (ref 26.0–34.0)
MCHC: 33.3 g/dL (ref 30.0–36.0)
MCV: 90.9 fL (ref 80.0–100.0)
Monocytes Absolute: 0.9 K/uL (ref 0.1–1.0)
Monocytes Relative: 10 %
Neutro Abs: 7.2 K/uL (ref 1.7–7.7)
Neutrophils Relative %: 76 %
Platelets: 168 K/uL (ref 150–400)
RBC: 4.06 MIL/uL — ABNORMAL LOW (ref 4.22–5.81)
RDW: 14.2 % (ref 11.5–15.5)
WBC: 9.5 K/uL (ref 4.0–10.5)
nRBC: 0 % (ref 0.0–0.2)

## 2024-02-15 LAB — CSF CULTURE W GRAM STAIN
Culture: NO GROWTH
Special Requests: NORMAL

## 2024-02-15 LAB — TSH: TSH: 1.07 u[IU]/mL (ref 0.350–4.500)

## 2024-02-15 LAB — FOLATE: Folate: >20 ng/mL

## 2024-02-15 LAB — GLUCOSE, CAPILLARY
Glucose-Capillary: 141 mg/dL — ABNORMAL HIGH (ref 70–99)
Glucose-Capillary: 180 mg/dL — ABNORMAL HIGH (ref 70–99)
Glucose-Capillary: 165 mg/dL — ABNORMAL HIGH (ref 70–99)
Glucose-Capillary: 170 mg/dL — ABNORMAL HIGH (ref 70–99)

## 2024-02-15 LAB — AMMONIA: Ammonia: 15 umol/L (ref 9–35)

## 2024-02-15 LAB — VITAMIN B12: Vitamin B-12: 1510 pg/mL — ABNORMAL HIGH (ref 180–914)

## 2024-02-15 MED ORDER — CHLORHEXIDINE GLUCONATE CLOTH 2 % EX PADS
6.0000 | MEDICATED_PAD | Freq: Every day | CUTANEOUS | Status: DC
  Administered 2024-02-15: 6 via TOPICAL

## 2024-02-15 MED ORDER — LORAZEPAM 2 MG/ML IJ SOLN: 1.0000 mg | Freq: Once | INTRAMUSCULAR | Status: DC | PRN

## 2024-02-15 MED ORDER — LEVETIRACETAM 500 MG PO TABS
500.0000 mg | ORAL_TABLET | Freq: Two times a day (BID) | ORAL | Status: DC
  Administered 2024-02-15 – 2024-02-16 (×2): 500 mg via ORAL
  Filled 2024-02-15 (×2): qty 1

## 2024-02-15 MED ORDER — MUPIROCIN 2 % EX OINT
1.0000 | TOPICAL_OINTMENT | Freq: Two times a day (BID) | CUTANEOUS | Status: DC
  Administered 2024-02-15 – 2024-02-16 (×3): 1 via NASAL
  Filled 2024-02-15: qty 22

## 2024-02-15 NOTE — Progress Notes (Signed)
 "   [facility]  Date of Admission:  02/12/2024     Reason for Follow Up: Sepsis [facility])  Total days of antibiotics 4         ASSESSMENT:  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 Started on empiric meningitis coverage.   [name] mental status is improved and appears close to baseline on exam. Unlikely to be any infectious meningitis and HHV6 does not generally present this way. CSF cultures, blood cultures and respiratory panel are all negative thus far with no overwhelming evidence of infection. Discussed plan of care to discontinue antibiotics. Ok for discharge from ID standpoint with recommended follow up if symptoms return or worsen. No follow up with ID indicated at this. Standard/universal precautions. Remaining medical and supportive care per Primary Team.    PLAN:  Discontinue antibiotics.  Will continue to monitor blood and CSF cultures but would suspect no significant findings. Ok for discharge from ID standpoint with no office follow up indicated.  Standard/universal precautions. Remaining medical and supportive care per Primary Team.   Principal Problem:   Sepsis (HCC) Active Problems:   Pacemaker   Hypertension   Type II diabetes mellitus (HCC)   Iron deficiency anemia   Hyperlipidemia   Acute metabolic encephalopathy   Acute encephalopathy   Encephalopathy    aspirin  EC  81 mg Oral Daily   Chlorhexidine  Gluconate Cloth  6 each Topical Daily   cholecalciferol   1,000 Units Oral Daily   cyanocobalamin   1,000 mcg Oral Daily   enoxaparin  (LOVENOX ) injection  40 mg Subcutaneous Q24H   insulin  aspart  0-15 Units Subcutaneous TID WC    insulin  aspart  0-5 Units Subcutaneous QHS   levETIRAcetam   500 mg Intravenous Q12H   pantoprazole   40 mg Oral Daily   tamsulosin   0.8 mg Oral QHS    SUBJECTIVE:  Afebrile overnight with acute events. Tolerating antibiotics with no adverse side effects. No current headache or neck pain. Feeling back to baseline.   Allergies[1]   Review of Systems: Review of Systems  Constitutional:  Negative for chills, fever and weight loss.  Respiratory:  Negative for cough, shortness of breath and wheezing.   Cardiovascular:  Negative for chest pain and leg swelling.  Gastrointestinal:  Negative for abdominal pain, constipation, diarrhea, nausea and vomiting.  Skin:  Negative for rash.      OBJECTIVE: Vitals:   02/14/24 2029 02/14/24 2323 02/15/24 0411 02/15/24 0815  BP: 131/64 135/69 139/61 (!) 120/56  Pulse:  77 72 78  Resp: 18 17 15 17   Temp: 97.8 F (36.6 C) (!) 97.5 F (36.4 C) 97.6 F (36.4 C) 97.8 F (36.6 C)  TempSrc: Oral Oral Oral Oral  SpO2:  98% 98% 99%  Weight:      Height:       Body mass index is 25.64 kg/m.  Physical Exam Constitutional:      General: He is not in acute distress.    Appearance: He is well-developed.  Cardiovascular:     Rate and Rhythm: Normal rate and regular rhythm.     Heart sounds: Normal heart sounds.  Pulmonary:     Effort: Pulmonary effort is normal.  Breath sounds: Normal breath sounds.  Skin:    General: Skin is warm and dry.  Neurological:     Mental Status: He is alert and oriented to person, place, and time.     Lab Results Lab Results  Component Value Date   WBC 9.5 02/15/2024   HGB 12.3 (L) 02/15/2024   HCT 36.9 (L) 02/15/2024   MCV 90.9 02/15/2024   PLT 168 02/15/2024    Lab Results  Component Value Date   CREATININE 0.92 02/15/2024   BUN 20 02/15/2024   NA 136 02/15/2024   K 4.1 02/15/2024   CL 100 02/15/2024   CO2 20 (L) 02/15/2024    Lab Results  Component Value Date   ALT 31 02/15/2024   AST 32  02/15/2024   ALKPHOS 65 02/15/2024   BILITOT 0.6 02/15/2024     Microbiology: Recent Results (from the past 240 hours)  Blood culture (routine x 2)     Status: None (Preliminary result)   Collection Time: 02/12/24  6:15 PM   Specimen: BLOOD RIGHT ARM  Result Value Ref Range Status   Specimen Description BLOOD RIGHT ARM  Final   Special Requests   Final    BOTTLES DRAWN AEROBIC AND ANAEROBIC Blood Culture results may not be optimal due to an inadequate volume of blood received in culture bottles   Culture   Final    NO GROWTH 3 DAYS Performed at [facility], [address], [place], [place] [address]    Report Status PENDING  Incomplete  CSF culture     Status: None (Preliminary result)   Collection Time: 02/12/24  6:55 PM   Specimen: CSF; Cerebrospinal Fluid  Result Value Ref Range Status   Specimen Description CSF  Final   Special Requests Normal  Final   Gram Stain   Final    WBC PRESENT, PREDOMINANTLY MONONUCLEAR NO ORGANISMS SEEN CYTOSPIN SMEAR    Culture   Final    NO GROWTH 2 DAYS Performed at [facility], [address], [place], [place] [address]    Report Status PENDING  Incomplete  Resp panel by RT-PCR (RSV, Flu A&B, Covid) Anterior Nasal Swab     Status: None   Collection Time: 02/13/24  3:02 PM   Specimen: Anterior Nasal Swab  Result Value Ref Range Status   SARS Coronavirus 2 by RT PCR NEGATIVE NEGATIVE Final   Influenza A by PCR NEGATIVE NEGATIVE Final   Influenza B by PCR NEGATIVE NEGATIVE Final    Comment: (NOTE) The Xpert Xpress SARS-CoV-2/FLU/RSV plus assay is intended as an aid in the diagnosis of influenza from Nasopharyngeal swab specimens and should not be used as a sole basis for treatment. Nasal washings and aspirates are unacceptable for Xpert Xpress SARS-CoV-2/FLU/RSV testing.  Fact Sheet for Patients: bloggercourse.com  Fact Sheet for Healthcare  Providers: seriousbroker.it  This test is not yet approved or cleared by the [place]  FDA and has been authorized for detection and/or diagnosis of SARS-CoV-2 by FDA under an Emergency Use Authorization (EUA). This EUA will remain in effect (meaning this test can be used) for the duration of the COVID-19 declaration under Section 564(b)(1) of the Act, 21 U.S.C. section 360bbb-3(b)(1), unless the authorization is terminated or revoked.     Resp Syncytial Virus by PCR NEGATIVE NEGATIVE Final    Comment: (NOTE) Fact Sheet for Patients: bloggercourse.com  Fact Sheet for Healthcare Providers: seriousbroker.it  This test is not yet approved or cleared by the  [place]  FDA and has been authorized for detection and/or diagnosis of SARS-CoV-2 by FDA under an Emergency Use Authorization (EUA). This EUA will remain in effect (meaning this test can be used) for the duration of the COVID-19 declaration under Section 564(b)(1) of the Act, 21 U.S.C. section 360bbb-3(b)(1), unless the authorization is terminated or revoked.  Performed at [facility], [address] [place], [place] [address]   Respiratory (~20 pathogens) panel by PCR     Status: None   Collection Time: 02/13/24  3:02 PM   Specimen: Nasopharyngeal Swab; Respiratory  Result Value Ref Range Status   Adenovirus NOT DETECTED NOT DETECTED Final   Coronavirus 229E NOT DETECTED NOT DETECTED Final    Comment: (NOTE) The Coronavirus on the Respiratory Panel, DOES NOT test for the novel  Coronavirus (2019 nCoV)    Coronavirus HKU1 NOT DETECTED NOT DETECTED Final   Coronavirus NL63 NOT DETECTED NOT DETECTED Final   Coronavirus OC43 NOT DETECTED NOT DETECTED Final   Metapneumovirus NOT DETECTED NOT DETECTED Final   Rhinovirus / Enterovirus NOT DETECTED NOT DETECTED Final   Influenza A NOT DETECTED NOT DETECTED Final   Influenza B NOT DETECTED  NOT DETECTED Final   Parainfluenza Virus 1 NOT DETECTED NOT DETECTED Final   Parainfluenza Virus 2 NOT DETECTED NOT DETECTED Final   Parainfluenza Virus 3 NOT DETECTED NOT DETECTED Final   Parainfluenza Virus 4 NOT DETECTED NOT DETECTED Final   Respiratory Syncytial Virus NOT DETECTED NOT DETECTED Final   Bordetella pertussis NOT DETECTED NOT DETECTED Final   Bordetella Parapertussis NOT DETECTED NOT DETECTED Final   Chlamydophila pneumoniae NOT DETECTED NOT DETECTED Final   Mycoplasma pneumoniae NOT DETECTED NOT DETECTED Final    Comment: Performed at [facility], [address] [place], [place] [address]  Culture, blood (Routine X 2) w Reflex to ID Panel     Status: None (Preliminary result)   Collection Time: 02/13/24  3:22 PM   Specimen: BLOOD RIGHT HAND  Result Value Ref Range Status   Specimen Description BLOOD RIGHT HAND  Final   Special Requests   Final    BOTTLES DRAWN AEROBIC AND ANAEROBIC Blood Culture results may not be optimal due to an inadequate volume of blood received in culture bottles   Culture   Final    NO GROWTH 2 DAYS Performed at [facility], [address], [place], [place] [address]    Report Status PENDING  Incomplete  Culture, blood (Routine X 2) w Reflex to ID Panel     Status: None (Preliminary result)   Collection Time: 02/13/24  3:22 PM   Specimen: BLOOD LEFT HAND  Result Value Ref Range Status   Specimen Description BLOOD LEFT HAND  Final   Special Requests   Final    BOTTLES DRAWN AEROBIC AND ANAEROBIC Blood Culture results may not be optimal due to an inadequate volume of blood received in culture bottles   Culture   Final    NO GROWTH 2 DAYS Performed at [facility], [address], [place], [place] [address]    Report Status PENDING  Incomplete  MRSA Next Gen by PCR, Nasal     Status: Abnormal   Collection Time: 02/14/24  5:20 AM   Specimen: Nasal Mucosa; Nasal Swab  Result Value Ref Range Status   MRSA by PCR Next Gen  DETECTED (A) NOT DETECTED Final    Comment: (NOTE) The GeneXpert MRSA Assay (FDA approved for  NASAL specimens only), is one component of a comprehensive MRSA colonization surveillance program. It is not intended to diagnose MRSA infection nor to guide or monitor treatment for MRSA infections. Test performance is not FDA approved in patients less than 45 years old. Performed at [facility], [address], [place], [place] [address]     I personally spent a total of 24 minutes in the care of the patient today including preparing to see the patient, performing a medically appropriate exam/evaluation, counseling and educating, referring and communicating with other health care professionals, independently interpreting results, and communicating results.   Cathlyn July, NP [facility]  02/15/2024  10:40 AM     [1]  Allergies Allergen Reactions   Doxycycline Rash, Shortness Of Breath, Dermatitis and Nausea And Vomiting   Prednisone Other (See Comments)    High blood sugar   Sulfa Antibiotics Rash and Other (See Comments)    Headache/ Chest tightness/ stiff neck   Advair Hfa [Fluticasone -Salmeterol] Other (See Comments)    Thrush and sore throat   Pirfenidone  Itching, Rash and Other (See Comments)    Other Reaction(s): Lip swelling, Itching, Erythema, Lip swelling, Itching, Erythema   Amoxicillin Rash   Furosemide  Dermatitis and Rash   Gabapentin Nausea And Vomiting   Lisinopril  Cough   Ofev  [Nintedanib] Nausea And Vomiting   "

## 2024-02-15 NOTE — Progress Notes (Signed)
 Discussed with Dr. Dennise. Mentation is improved. Etiology remains unclear. Encephalopathy labs are pending. Will start thiamine  100mg  PO daily and continue through discharge.  Follow up with neurology outpatient.  We will signoff.  Zyliah Schier [facility]

## 2024-02-15 NOTE — Plan of Care (Signed)
  Problem: Education: Goal: Knowledge of General Education information will improve Description: Including pain rating scale, medication(s)/side effects and non-pharmacologic comfort measures Outcome: Progressing   Problem: Health Behavior/Discharge Planning: Goal: Ability to manage health-related needs will improve Outcome: Progressing   Problem: Clinical Measurements: Goal: Will remain free from infection Outcome: Progressing Goal: Diagnostic test results will improve Outcome: Progressing   Problem: Activity: Goal: Risk for activity intolerance will decrease Outcome: Progressing   Problem: Coping: Goal: Level of anxiety will decrease Outcome: Progressing   Problem: Elimination: Goal: Will not experience complications related to urinary retention Outcome: Progressing   Problem: Pain Managment: Goal: General experience of comfort will improve and/or be controlled Outcome: Progressing

## 2024-02-15 NOTE — Progress Notes (Signed)
 "                                                                                                                                                                                                                                                                                PROGRESS NOTE     Patient Demographics:    Scott Price, is a 79 y.o. male, DOB - 14-Mar-1945, FMW:[id number]  Outpatient Primary MD for the patient is [facility]    LOS - 3  Admit date - 02/12/2024    Chief Complaint  Patient presents with   AMS   Code Stroke       Brief Narrative (HPI from H&P)    79 y.o. male with medical history significant of type 2 diabetes, essential hypertension, history of sick sinus syndrome with complete heart block status post pacemaker placement, coronary artery disease, iron deficiency anemia who was transferred from [facility] as a code stroke.  Patient is doing well apparently prior to all this.  He was working and drove himself home.  Patient was started on Ozempic last week.  First dose of this in the second of this week.  He started feeling bad and told his doctors.  He was taken off the Ozempic and told not to use it again.  Patient went back to taking his metformin  yesterday.  Today he came home and family noted he is confused.  His wife was right in front of him but he was calling her out.  Patient also with some fever.  They were worried and took him to the ER.  Patient was having acute metabolic encephalopathy of unknown cause.  Acute stroke suspected and a code stroke initiated.    Patient has been seen by neurology here.  No obvious neurologic deficit but he is agitated and confused.  He meets sepsis criteria with a temperature of 101.3, white count 14.9.  He is also having significant tachycardia.  No obvious source.  With altered mental status suspicion was for possible meningitis.  Lumbar puncture was performed and patient is being admitted to the hospital for further  evaluation and treatment.    Subjective:   Patient  in bed, appears comfortable, denies any headache, no fever, no chest pain or pressure, no shortness of breath , no abdominal pain. No new focal weakness.    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  CT head unremarkable, EEG does not show any active seizures however some nonspecific findings were noted.  Neurology on board.  On IV Keppra  for seizures.  Mentation has improved, follow cultures, defer management of antibiotics and antiseizure medications to ID and neurology.  MRI brain pending, follow cultures.  ID managing antibiotics and antivirals.  Note antiviral was discontinued by ID on 02/14/2024.   #2 Suspected meningitis: See above   #3 Acute metabolic encephalopathy: Patient has no evidence of CVA.  Workup will continue per neurology.  When stable may get MRI of the brain.   #4 Non-insulin -dependent diabetes: Initiate sliding scale insulin .  Lab Results  Component Value Date   HGBA1C 8.0 (H) 02/13/2024   CBG (last 3)  Recent Labs    02/14/24 1627 02/14/24 1958 02/15/24 0827  GLUCAP 191* 168* 141*      #5 essential hypertension: For now blood pressure appears stable.  IV hydralazine  ordered as needed   #6 hyperlipidemia: Resume statin once taking oral medications.   #7 hypercalcemia: Due to dehydration resolved after IV fluids   #8 AKI: Lying CKD 2, improved with IV fluids.   #9 Sick sinus syndrome: Patient has a pacemaker in place.  Continue to monitor        Condition - Fair  Family Communication  :    Wife and daughter Clotilda, daughter's phone  [phone], wife's cell phone number is #[phone], both updated on 02/14/2024 and message left on 02/15/2024 at 8:47 AM on wife's cell phone  Code Status :  Full  Consults  :   ID, Neuro  PUD Prophylaxis :     Procedures  :     MRI -   CT - 1. No acute findings in the chest, abdomen, or pelvis. 2. Cholelithiasis without CT evidence of acute cholecystitis or biliary ductal dilation. 3. Severe distal colonic diverticulosis and scattered transverse colon diverticula without CT evidence of acute diverticulitis. 4. Extensive atherosclerotic calcification involving the lower extremity arterial inflow and outflow, with suspected hemodynamically significant stenoses on this noncontrast exam; if symptoms of lower extremity arterial insufficiency are present, CT arteriography may be helpful for further evaluation. 5. Extensive multivessel coronary artery calcification. 6. Calcification of the aortic valve leaflets. 7. Mild prostatic hypertrophy  EEG -  This study is suggestive of cortical dysfunction arising from left hemisphere likely secondary to underlying structural abnormality, post ictal state. No seizures or epileptiform discharges were seen throughout the recording  CT - 1. No acute intracranial abnormality. 2. ASPECTS 10. 3. Findings messaged to Dr. Lindzen at 3:42 PM on 02/12/24.       Disposition Plan  :    Status is: Inpatient   DVT Prophylaxis  :    enoxaparin  (LOVENOX ) injection 40 mg Start: 02/13/24 1114   Lab Results  Component Value Date   PLT 168 02/15/2024    Diet :  Diet Order  Diet heart healthy/carb modified Room service appropriate? Yes; Fluid consistency: Thin  Diet effective now                    Inpatient Medications  Scheduled Meds:  aspirin  EC  81 mg Oral Daily   Chlorhexidine  Gluconate Cloth  6 each Topical Daily   cholecalciferol   1,000 Units Oral Daily   cyanocobalamin   1,000 mcg Oral Daily   enoxaparin  (LOVENOX ) injection  40 mg  Subcutaneous Q24H   insulin  aspart  0-15 Units Subcutaneous TID WC   insulin  aspart  0-5 Units Subcutaneous QHS   levETIRAcetam   500 mg Intravenous Q12H   pantoprazole   40 mg Oral Daily   tamsulosin   0.8 mg Oral QHS   Continuous Infusions:  lactated ringers  50 mL/hr at 02/14/24 1028   meropenem  (MERREM ) IV 2 g (02/15/24 0504)   vancomycin  1,000 mg (02/14/24 2031)   PRN Meds:.acetaminophen  **OR** acetaminophen , haloperidol  lactate, hydrALAZINE , melatonin, ondansetron  **OR** ondansetron  (ZOFRAN ) IV  Antibiotics  :    Anti-infectives (From admission, onward)    Start     Dose/Rate Route Frequency Ordered Stop   02/14/24 2200  meropenem  (MERREM ) 2 g in sodium chloride  0.9 % 100 mL IVPB        2 g 280 mL/hr over 30 Minutes Intravenous Every 8 hours 02/14/24 1626     02/13/24 2000  vancomycin  (VANCOCIN ) IVPB 1000 mg/200 mL premix        1,000 mg 200 mL/hr over 60 Minutes Intravenous Every 24 hours 02/12/24 2044     02/13/24 1615  ganciclovir  (CYTOVENE ) 195 mg in sodium chloride  0.9 % 100 mL IVPB  Status:  Discontinued        2.5 mg/kg  78.7 kg 100 mL/hr over 60 Minutes Intravenous Every 12 hours 02/13/24 1524 02/14/24 1639   02/13/24 1200  ceFEPIme (MAXIPIME) 2 g in sodium chloride  0.9 % 100 mL IVPB  Status:  Discontinued        2 g 200 mL/hr over 30 Minutes Intravenous  Once 02/13/24 1114 02/13/24 1121   02/13/24 1200  vancomycin  (VANCOCIN ) IVPB 1000 mg/200 mL premix  Status:  Discontinued        1,000 mg 200 mL/hr over 60 Minutes Intravenous  Once 02/13/24 1114 02/13/24 1122   02/13/24 1114  metroNIDAZOLE  (FLAGYL ) IVPB 500 mg  Status:  Discontinued        500 mg 100 mL/hr over 60 Minutes Intravenous Every 12 hours 02/13/24 1114 02/13/24 1122   02/13/24 1000  acyclovir  (ZOVIRAX ) 785 mg in dextrose  5 % 250 mL IVPB  Status:  Discontinued        10 mg/kg  78.7 kg 265.7 mL/hr over 60 Minutes Intravenous Every 12 hours 02/12/24 2044 02/13/24 1522   02/12/24 2200  meropenem  (MERREM ) 2  g in sodium chloride  0.9 % 100 mL IVPB  Status:  Discontinued        2 g 280 mL/hr over 30 Minutes Intravenous Every 12 hours 02/12/24 2044 02/14/24 1626   02/12/24 1830  cefTRIAXone  (ROCEPHIN ) 2 g in sodium chloride  0.9 % 100 mL IVPB        2 g 200 mL/hr over 30 Minutes Intravenous  Once 02/12/24 1817 02/12/24 1927   02/12/24 1830  vancomycin  (VANCOREADY) IVPB 1750 mg/350 mL        1,750 mg 175 mL/hr over 120 Minutes Intravenous  Once 02/12/24 1821 02/12/24 2138   02/12/24 1830  acyclovir  (ZOVIRAX ) 800 mg in dextrose  5 %  250 mL IVPB        10 mg/kg  80 kg (Order-Specific) 266 mL/hr over 60 Minutes Intravenous Once 02/12/24 1826 02/12/24 2131         Objective:   Vitals:   02/14/24 2029 02/14/24 2323 02/15/24 0411 02/15/24 0815  BP: 131/64 135/69 139/61 (!) 120/56  Pulse:  77 72 78  Resp: 18 17 15 17   Temp: 97.8 F (36.6 C) (!) 97.5 F (36.4 C) 97.6 F (36.4 C) 97.8 F (36.6 C)  TempSrc: Oral Oral Oral Oral  SpO2:  98% 98% 99%  Weight:      Height:        Wt Readings from Last 3 Encounters:  02/12/24 78.7 kg  12/04/23 76 kg  08/26/23 79 kg     Intake/Output Summary (Last 24 hours) at 02/15/2024 0845 Last data filed at 02/15/2024 0407 Gross per 24 hour  Intake 720 ml  Output 3050 ml  Net -2330 ml     Physical Exam  Awake Alert, No new F.N deficits, Normal affect Divide.AT,PERRAL Supple Neck, No JVD,   Symmetrical Chest wall movement, Good air movement bilaterally, CTAB RRR,No Gallops,Rubs or new Murmurs,  +ve B.Sounds, Abd Soft, No tenderness,   No Cyanosis, Clubbing or edema        Data Review:    Recent Labs  Lab 02/12/24 1544 02/12/24 1922 02/13/24 1136 02/13/24 2335 02/14/24 0826 02/15/24 0429  WBC 14.9*  --  13.7* 14.8* 12.1* 9.5  HGB 14.0 14.3 13.2 12.4* 11.9* 12.3*  HCT 42.9 42.0 39.8 37.0* 36.7* 36.9*  PLT 207  --  176 165 161 168  MCV 92.9  --  91.7 92.7 93.6 90.9  MCH 30.3  --  30.4 31.1 30.4 30.3  MCHC 32.6  --  33.2 33.5 32.4 33.3   RDW 14.0  --  14.6 14.6 14.6 14.2  LYMPHSABS 1.1  --   --   --  1.1 1.1  MONOABS 1.1*  --   --   --  1.0 0.9  EOSABS 0.1  --   --   --  0.1 0.2  BASOSABS 0.0  --   --   --  0.1 0.0    Recent Labs  Lab 02/12/24 1544 02/12/24 1922 02/13/24 1136 02/13/24 1522 02/13/24 2335 02/14/24 0826 02/15/24 0429  NA 140 139  --   --  138 137 136  K 4.1 4.5  --   --  3.8 4.2 4.1  CL 96* 104  --   --  103 102 100  CO2 12*  --   --   --  19* 14* 20*  ANIONGAP 32*  --   --   --  17* 22* 16*  GLUCOSE 262* 201*  --   --  91 88 137*  BUN 21 25*  --   --  19 18 20   CREATININE 1.49* 1.50* 1.32*  --  1.02 0.95 0.92  AST 33  --   --   --  36 38 32  ALT 39  --   --   --  32 29 31  ALKPHOS 70  --   --   --  60 62 65  BILITOT 1.0  --   --   --  0.6 0.5 0.6  ALBUMIN 4.8  --   --   --  4.2 3.9 4.2  CRP  --   --   --   --   --  0.8  --  PROCALCITON  --   --   --   --   --  0.12  --   LATICACIDVEN  --   --  1.2 1.1  --   --   --   INR 1.0  --   --   --  1.1  --   --   HGBA1C  --   --  8.0*  --   --   --   --   MG  --   --   --   --   --  1.7 1.9  PHOS  --   --   --   --   --  2.9  --   CALCIUM  10.7*  --   --   --  9.5 9.4 9.9      Recent Labs  Lab 02/12/24 1544 02/13/24 1136 02/13/24 1522 02/13/24 2335 02/14/24 0826 02/15/24 0429  CRP  --   --   --   --  0.8  --   PROCALCITON  --   --   --   --  0.12  --   LATICACIDVEN  --  1.2 1.1  --   --   --   INR 1.0  --   --  1.1  --   --   HGBA1C  --  8.0*  --   --   --   --   MG  --   --   --   --  1.7 1.9  CALCIUM  10.7*  --   --  9.5 9.4 9.9    --------------------------------------------------------------------------------------------------------------- Lab Results  Component Value Date   CHOL 94 (L) 04/30/2021   HDL 39 (L) 04/30/2021   LDLCALC 29 04/30/2021   TRIG 154 (H) 04/30/2021   CHOLHDL 2.4 04/30/2021    Lab Results  Component Value Date   HGBA1C 8.0 (H) 02/13/2024   No results for input(s): TSH, T4TOTAL, FREET4,  T3FREE, THYROIDAB in the last 72 hours. No results for input(s): VITAMINB12, FOLATE, FERRITIN, TIBC, IRON, RETICCTPCT in the last 72 hours. ------------------------------------------------------------------------------------------------------------------ Cardiac Enzymes No results for input(s): CKMB, TROPONINI, MYOGLOBIN in the last 168 hours.  Invalid input(s): CK  Micro Results Recent Results (from the past 240 hours)  Blood culture (routine x 2)     Status: None (Preliminary result)   Collection Time: 02/12/24  6:15 PM   Specimen: BLOOD RIGHT ARM  Result Value Ref Range Status   Specimen Description BLOOD RIGHT ARM  Final   Special Requests   Final    BOTTLES DRAWN AEROBIC AND ANAEROBIC Blood Culture results may not be optimal due to an inadequate volume of blood received in culture bottles   Culture   Final    NO GROWTH 2 DAYS Performed at [facility], [address] [place], [place] [address]    Report Status PENDING  Incomplete  CSF culture     Status: None (Preliminary result)   Collection Time: 02/12/24  6:55 PM   Specimen: CSF; Cerebrospinal Fluid  Result Value Ref Range Status   Specimen Description CSF  Final   Special Requests Normal  Final   Gram Stain   Final    WBC PRESENT, PREDOMINANTLY MONONUCLEAR NO ORGANISMS SEEN CYTOSPIN SMEAR    Culture   Final    NO GROWTH 2 DAYS Performed at [facility], [address] [place], [place] [address]    Report Status PENDING  Incomplete  Resp panel by RT-PCR (RSV, Flu A&B,  Covid) Anterior Nasal Swab     Status: None   Collection Time: 02/13/24  3:02 PM   Specimen: Anterior Nasal Swab  Result Value Ref Range Status   SARS Coronavirus 2 by RT PCR NEGATIVE NEGATIVE Final   Influenza A by PCR NEGATIVE NEGATIVE Final   Influenza B by PCR NEGATIVE NEGATIVE Final    Comment: (NOTE) The Xpert Xpress SARS-CoV-2/FLU/RSV plus assay is intended as an aid in the diagnosis of influenza from  Nasopharyngeal swab specimens and should not be used as a sole basis for treatment. Nasal washings and aspirates are unacceptable for Xpert Xpress SARS-CoV-2/FLU/RSV testing.  Fact Sheet for Patients: bloggercourse.com  Fact Sheet for Healthcare Providers: seriousbroker.it  This test is not yet approved or cleared by the [place]  FDA and has been authorized for detection and/or diagnosis of SARS-CoV-2 by FDA under an Emergency Use Authorization (EUA). This EUA will remain in effect (meaning this test can be used) for the duration of the COVID-19 declaration under Section 564(b)(1) of the Act, 21 U.S.C. section 360bbb-3(b)(1), unless the authorization is terminated or revoked.     Resp Syncytial Virus by PCR NEGATIVE NEGATIVE Final    Comment: (NOTE) Fact Sheet for Patients: bloggercourse.com  Fact Sheet for Healthcare Providers: seriousbroker.it  This test is not yet approved or cleared by the [place]  FDA and has been authorized for detection and/or diagnosis of SARS-CoV-2 by FDA under an Emergency Use Authorization (EUA). This EUA will remain in effect (meaning this test can be used) for the duration of the COVID-19 declaration under Section 564(b)(1) of the Act, 21 U.S.C. section 360bbb-3(b)(1), unless the authorization is terminated or revoked.  Performed at [facility], [address], [place], [place] [address]   Respiratory (~20 pathogens) panel by PCR     Status: None   Collection Time: 02/13/24  3:02 PM   Specimen: Nasopharyngeal Swab; Respiratory  Result Value Ref Range Status   Adenovirus NOT DETECTED NOT DETECTED Final   Coronavirus 229E NOT DETECTED NOT DETECTED Final    Comment: (NOTE) The Coronavirus on the Respiratory Panel, DOES NOT test for the novel  Coronavirus (2019 nCoV)    Coronavirus HKU1 NOT DETECTED NOT DETECTED Final    Coronavirus NL63 NOT DETECTED NOT DETECTED Final   Coronavirus OC43 NOT DETECTED NOT DETECTED Final   Metapneumovirus NOT DETECTED NOT DETECTED Final   Rhinovirus / Enterovirus NOT DETECTED NOT DETECTED Final   Influenza A NOT DETECTED NOT DETECTED Final   Influenza B NOT DETECTED NOT DETECTED Final   Parainfluenza Virus 1 NOT DETECTED NOT DETECTED Final   Parainfluenza Virus 2 NOT DETECTED NOT DETECTED Final   Parainfluenza Virus 3 NOT DETECTED NOT DETECTED Final   Parainfluenza Virus 4 NOT DETECTED NOT DETECTED Final   Respiratory Syncytial Virus NOT DETECTED NOT DETECTED Final   Bordetella pertussis NOT DETECTED NOT DETECTED Final   Bordetella Parapertussis NOT DETECTED NOT DETECTED Final   Chlamydophila pneumoniae NOT DETECTED NOT DETECTED Final   Mycoplasma pneumoniae NOT DETECTED NOT DETECTED Final    Comment: Performed at [facility], [address], [place], [place] [address]  Culture, blood (Routine X 2) w Reflex to ID Panel     Status: None (Preliminary result)   Collection Time: 02/13/24  3:22 PM   Specimen: BLOOD RIGHT HAND  Result Value Ref Range Status   Specimen Description BLOOD RIGHT HAND  Final   Special Requests   Final    BOTTLES DRAWN AEROBIC  AND ANAEROBIC Blood Culture results may not be optimal due to an inadequate volume of blood received in culture bottles   Culture   Final    NO GROWTH < 24 HOURS Performed at [facility], [address], [place], [place] [address]    Report Status PENDING  Incomplete  Culture, blood (Routine X 2) w Reflex to ID Panel     Status: None (Preliminary result)   Collection Time: 02/13/24  3:22 PM   Specimen: BLOOD LEFT HAND  Result Value Ref Range Status   Specimen Description BLOOD LEFT HAND  Final   Special Requests   Final    BOTTLES DRAWN AEROBIC AND ANAEROBIC Blood Culture results may not be optimal due to an inadequate volume of blood received in culture bottles   Culture   Final    NO GROWTH < 24  HOURS Performed at [facility], [address], [place], [place] [address]    Report Status PENDING  Incomplete  MRSA Next Gen by PCR, Nasal     Status: Abnormal   Collection Time: 02/14/24  5:20 AM   Specimen: Nasal Mucosa; Nasal Swab  Result Value Ref Range Status   MRSA by PCR Next Gen DETECTED (A) NOT DETECTED Final    Comment: (NOTE) The GeneXpert MRSA Assay (FDA approved for NASAL specimens only), is one component of a comprehensive MRSA colonization surveillance program. It is not intended to diagnose MRSA infection nor to guide or monitor treatment for MRSA infections. Test performance is not FDA approved in patients less than 73 years old. Performed at [facility], [address] [place], [place] [address]     Radiology Report Overnight EEG with video Result Date: 02/14/2024 Shelton Arlin KIDD, MD     02/14/2024  4:35 PM Patient Name: DEVIAN BARTOLOMEI MRN: [id number] Epilepsy Attending: Arlin KIDD Shelton Referring Physician/Provider: Waddell Karna LABOR, NP Duration: 02/13/2024 1500 to 02/14/2024 1040 Patient history: 78 y.o. male  who presented to [facility] for evaluation of nausea after work and then while speaking to his wife became aphasic. EMS noted twitching of his right arm and foot. EEG to evaluate for seizure. Level of alertness: Awake, asleep AEDs during EEG study: LEV Technical aspects: This EEG study was done with scalp electrodes positioned according to the 10-20 International system of electrode placement. Electrical activity was reviewed with band pass filter of 1-70Hz , sensitivity of 7 uV/mm, display speed of 71mm/sec with a 60Hz  notched filter applied as appropriate. EEG data were recorded continuously and digitally stored.  Video monitoring was available and reviewed as appropriate. Description: The posterior dominant rhythm consists of 8Hz  activity of moderate voltage (25-35 uV) seen predominantly in posterior head regions, symmetric and reactive to eye opening and eye  closing. Sleep was characterized by vertex waves, sleep spindles (12 to 14 Hz), maximal frontocentral region. There is continuous 3 to 6 Hz theta-delta slowing in left hemisphere. Hyperventilation and photic stimulation were not performed.   ABNORMALITY - Continuous slow,  left hemisphere IMPRESSION: This study is suggestive of cortical dysfunction arising from left hemisphere likely secondary to underlying structural abnormality, post ictal state. No seizures or epileptiform discharges were seen throughout the recording. Priyanka KIDD Shelton   CT CHEST ABDOMEN PELVIS WO CONTRAST Result Date: 02/13/2024 EXAM: CT CHEST, ABDOMEN AND PELVIS WITHOUT CONTRAST 02/13/2024 04:39:53 PM TECHNIQUE: CT of the chest, abdomen and pelvis was performed without the administration of intravenous contrast. Multiplanar reformatted images are provided for review. Automated  exposure control, iterative reconstruction, and/or weight based adjustment of the mA/kV was utilized to reduce the radiation dose to as low as reasonably achievable. COMPARISON: None available. CLINICAL HISTORY: Fevers or sepsis of unclear origin. FINDINGS: CHEST: MEDIASTINUM AND LYMPH NODES: Calcification of the aortic valve leaflets. Extensive multivessel coronary artery calcification. A left subclavian dual-lead pacemaker is identified with leads within the right atrium and right ventricle toward the inferior septum. Moderate atherosclerotic calcification within the thoracic aorta. The central airways are clear. No mediastinal, hilar or axillary lymphadenopathy. LUNGS AND PLEURA: Mild bilateral pulmonary dependent fibrotic change. No focal consolidation or pulmonary edema. No pleural effusion. No pneumothorax. ABDOMEN AND PELVIS: LIVER: Unremarkable. GALLBLADDER AND BILE DUCTS: Cholelithiasis without superimposed pericholecystic inflammatory change. No intra or extrahepatic biliary ductal dilation. SPLEEN: No acute abnormality. PANCREAS: No acute abnormality.  ADRENAL GLANDS: No acute abnormality. KIDNEYS, URETERS AND BLADDER: Foley catheter balloon within a decompressed bladder lumen. No stones in the kidneys or ureters. No hydronephrosis. No perinephric or periureteral stranding. GI AND BOWEL: Stomach demonstrates no acute abnormality. Severe distal colonic diverticulosis. Scattered diverticula seen throughout the transverse colon. No superimposed acute inflammatory change. Appendix normal. The small bowel and large bowel are otherwise unremarkable. There is no bowel obstruction. REPRODUCTIVE ORGANS: Mild prostatic hypertrophy. PERITONEUM AND RETROPERITONEUM: No ascites. No free air. VASCULATURE: Aorta is normal in caliber. Moderate atherosclerotic calcification within the thoracic aorta. Extend to the aortoiliac atherosclerotic calcification. Particularly prominent lower extremity arterial inflow and outflow calcifications. All the degree of stenosis is not well assessed on this noncontrast examination, hemodynamically significant stenoses are suspected and correlation for lower extremity arterial insufficiency (rest pain, claudication) is recommended. If present, CT arteriography may be helpful for further evaluation. ABDOMINAL AND PELVIS LYMPH NODES: No lymphadenopathy. BONES AND SOFT TISSUES: Osseous structures are age appropriate. No acute bone abnormality. No lytic or blastic bone lesion. No focal soft tissue abnormality. IMPRESSION: 1. No acute findings in the chest, abdomen, or pelvis. 2. Cholelithiasis without CT evidence of acute cholecystitis or biliary ductal dilation. 3. Severe distal colonic diverticulosis and scattered transverse colon diverticula without CT evidence of acute diverticulitis. 4. Extensive atherosclerotic calcification involving the lower extremity arterial inflow and outflow, with suspected hemodynamically significant stenoses on this noncontrast exam; if symptoms of lower extremity arterial insufficiency are present, CT arteriography may  be helpful for further evaluation. 5. Extensive multivessel coronary artery calcification. 6. Calcification of the aortic valve leaflets. 7. Mild prostatic hypertrophy. Electronically signed by: Dorethia Molt MD MD 02/13/2024 07:38 PM EST RP Workstation: [id number]     Signature  -   Lavada Stank M.D on 02/15/2024 at 8:45 AM   -  To page go to [email or web address]   "

## 2024-02-15 NOTE — Progress Notes (Signed)
 Physical Therapy Treatment Patient Details Name: Scott Price MRN: [id number] DOB: 1945/06/24 Today's Date: 02/15/2024   History of Present Illness 79 year old male who was transferred from outside hospital as a code stroke.  Patient was working and drove himself home, feeling nauseous and later family noted him to be confused, and febrile; workup for sepsis with unclear sourse, Infectioust disease following; So far, imaging neg (MRI pending, for when pt able to toelrate), LP done. Per ID, meningitis unlikely, concerns for septic/metabolic encephalopathy. PMH: DM 2, HTN, sick sinus syndrome with CHB s/p PPM, CAD, IDA    PT Comments  Pt is progressing well towards goals. Pt currently is mod I for sit to stand/transfers with increased time, incr BOS and no AD. Pt is CGA to supervision for gait/stairs per home set up. Pt reports he normally ambulates in shoes and feels funny ambulating without shoes. Due to pt current functional status, home set up and available assistance at home no recommended skilled physical therapy services at this time on discharge from acute care hospital setting. Will continue to follow in acute setting in order to ensure that pt returns home with decreased risk for falls, injury, re-hospitalization and improved activity tolerance.      If plan is discharge home, recommend the following: Assistance with cooking/housework;Supervision due to cognitive status;Help with stairs or ramp for entrance     Equipment Recommendations  None recommended by PT       Precautions / Restrictions Precautions Precautions: Fall Recall of Precautions/Restrictions: Intact Restrictions Weight Bearing Restrictions Per Provider Order: No     Mobility  Bed Mobility     General bed mobility comments: Pt in recliner. Demonstrates overall good strength.    Transfers Overall transfer level: Modified independent Equipment used: None Transfers: Sit to/from Stand Sit to Stand: Modified  independent (Device/Increase time)           General transfer comment: slight increase in BOS    Ambulation/Gait Ambulation/Gait assistance: Contact guard assist, Supervision Gait Distance (Feet): 400 Feet Assistive device: None Gait Pattern/deviations: Step-through pattern, Decreased stride length Gait velocity: mildly decreased Gait velocity interpretation: 1.31 - 2.62 ft/sec, indicative of limited community ambulator   General Gait Details: initially unstead, improved with distance. Vital signs stable on room air.   Stairs Stairs: Yes Stairs assistance: Contact guard assist Stair Management: One rail Right, Alternating pattern, Step to pattern Number of Stairs: 5 General stair comments: alternating ascending, step to descending. 1x mild buckle while ascening pt was able to correct without increased physical assist.      Balance Overall balance assessment: Mild deficits observed, not formally tested Sitting-balance support: Feet supported, No upper extremity supported Sitting balance-Leahy Scale: Good     Standing balance support: No upper extremity supported, During functional activity Standing balance-Leahy Scale: Fair      Hotel Manager: No apparent difficulties  Cognition Arousal: Alert Behavior During Therapy: WFL for tasks assessed/performed   PT - Cognitive impairments: No apparent impairments   Following commands: Intact      Cueing Cueing Techniques: Verbal cues     General Comments General comments (skin integrity, edema, etc.): vital signs stable on room air.      Pertinent Vitals/Pain Pain Assessment Pain Assessment: 0-10 Pain Score: 4  Pain Location: chronic pain in neck and shoulder. Pain Descriptors / Indicators: Aching, Sore Pain Intervention(s): Monitored during session     PT Goals (current goals can now be found in the care plan section) Acute Rehab PT Goals  Patient Stated Goal: home PT Goal  Formulation: With patient Time For Goal Achievement: 02/28/24 Potential to Achieve Goals: Good Progress towards PT goals: Progressing toward goals    Frequency    Min 2X/week      PT Plan  Continue with current POC        AM-PAC PT 6 Clicks Mobility   Outcome Measure  Help needed turning from your back to your side while in a flat bed without using bedrails?: None Help needed moving from lying on your back to sitting on the side of a flat bed without using bedrails?: None Help needed moving to and from a bed to a chair (including a wheelchair)?: None Help needed standing up from a chair using your arms (e.g., wheelchair or bedside chair)?: None Help needed to walk in hospital room?: A Little Help needed climbing 3-5 steps with a railing? : A Little 6 Click Score: 22    End of Session Equipment Utilized During Treatment: Gait belt Activity Tolerance: Patient tolerated treatment well Patient left: in chair;with call bell/phone within reach;with chair alarm set Nurse Communication: Mobility status PT Visit Diagnosis: Unsteadiness on feet (R26.81)     Time: 8468-8454 PT Time Calculation (min) (ACUTE ONLY): 14 min  Charges:    $Therapeutic Activity: 8-22 mins PT General Charges $$ ACUTE PT VISIT: 1 Visit                     Dorothyann Maier, DPT, CLT  Acute Rehabilitation Services Office: [phone] (Secure chat preferred)    Dorothyann VEAR Maier 02/15/2024, 3:50 PM

## 2024-02-15 NOTE — TOC Initial Note (Addendum)
 Transition of Care [facility]) - Initial/Assessment Note    Patient Details  Name: Scott Price MRN: [id number] Date of Birth: 04-19-1945  Transition of Care [facility]) CM/Scott Contact:    Scott DELENA Senters, RN Phone Number: 02/15/2024, 9:53 AM  Clinical Narrative:                 [id number] history significant of type 2 diabetes, essential hypertension, history of sick sinus syndrome with complete heart block status post pacemaker placement, coronary artery disease, iron deficiency anemia who was transferred from [facility] as a code stroke.  Patient is doing well apparently prior to all this.  He was working and drove himself home.  Patient was started on Ozempic last week.  First dose of this in the second of this week.  He started feeling bad and told his doctors.  He was taken off the Ozempic and told not to use it again.  Patient went back to taking his metformin  yesterday.  Today he came home and family noted he is confused.  His wife was right in front of him but he was calling her out.  Patient also with some fever.  They were worried and took him to the ER.    Patient lives with wife, who he takes care of. He reports his Scott Price helps with things at home when needed, but he typically manages ADL's and caregiving for his wife independently. Scott Price will be transportation home at d/c.   Patient has PCP, active with [facility], manages own medications, still works, drives. DME reviewed - RW, rollator, BSC, shower bench.   CM [facility] and left HIPAA compliant VM for callback.    CM will continue to follow.   11:21- [facility] Scott Price and Scott Price [phone] ext [phone].   Expected Discharge Plan: Home/Self Care Barriers to Discharge: Continued Medical Work up   Patient Goals and CMS Choice            Expected Discharge Plan and Services       Living arrangements for the past 2 months: Single Family Home                                       Prior Living Arrangements/Services Living arrangements for the past 2 months: Single Family Home Lives with:: Self, Spouse Patient language and need for interpreter reviewed:: Yes Do you feel safe going back to the place where you live?: Yes      Need for Family Participation in Patient Care: Yes (Comment) Care giver support system in place?: Yes (comment) Current home services: DME (RW, rollator, BSC, shower bench) Criminal Activity/Legal Involvement Pertinent to Current Situation/Hospitalization: No - Comment as needed  Activities of Daily Living      Permission Sought/Granted                  Emotional Assessment Appearance:: Developmentally appropriate Attitude/Demeanor/Rapport: Engaged Affect (typically observed): Calm Orientation: : Oriented to Self, Oriented to Place, Oriented to  Time, Oriented to Situation Alcohol / Substance Use: Not Applicable Psych Involvement: No (comment)  Admission diagnosis:  Delirium [R41.0] Acute encephalopathy [G93.40] Sepsis (HCC) [A41.9] Sepsis, due to unspecified organism, unspecified whether acute organ dysfunction present [facility]) [A41.9] Patient Active Problem List   Diagnosis Date Noted   Encephalopathy 02/14/2024   Acute encephalopathy 02/13/2024   Sepsis (HCC) 02/12/2024  Type II diabetes mellitus (HCC) 02/12/2024   Diabetic peripheral neuropathy associated with type 2 diabetes mellitus (HCC) 02/12/2024   Iron deficiency anemia 02/12/2024   Hyperlipidemia 02/12/2024   Acute metabolic encephalopathy 02/12/2024   Pacemaker 04/02/2020   Hypertension 04/02/2020   Complete heart block, transient (HCC) 12/26/2019   Bradycardia 12/21/2019   Unstable angina [facility])    PCP:  Clinic, [facility] Pharmacy:   [facility] [phone] - [place], [place] - [address] [place] [address] Phone: [phone] Fax: [phone]  [facility] - [place], [place] - [address] [place] [address] Phone: [phone] Fax: [phone]  [facility] - [place], [place] - [address] [place] [address] Phone: [phone] Fax: [phone]     Social Drivers of Health (SDOH) Social History: SDOH Screenings   Food Insecurity: No Food Insecurity (02/14/2024)  Housing: Unknown (02/14/2024)  Transportation Needs: No Transportation Needs (02/14/2024)  Utilities: Not At Risk (02/14/2024)  Social Connections: Socially Integrated (02/14/2024)  Tobacco Use: Medium Risk (02/12/2024)   SDOH Interventions:     Readmission Risk Interventions     No data to display

## 2024-02-16 ENCOUNTER — Other Ambulatory Visit (HOSPITAL_COMMUNITY): Payer: Self-pay

## 2024-02-16 DIAGNOSIS — A419 Sepsis, unspecified organism: Secondary | ICD-10-CM | POA: Diagnosis not present

## 2024-02-16 LAB — CBC WITH DIFFERENTIAL/PLATELET
Abs Immature Granulocytes: 0.03 K/uL (ref 0.00–0.07)
Basophils Absolute: 0 K/uL (ref 0.0–0.1)
Basophils Relative: 0 %
Eosinophils Absolute: 0.2 K/uL (ref 0.0–0.5)
Eosinophils Relative: 2 %
HCT: 35.7 % — ABNORMAL LOW (ref 39.0–52.0)
Hemoglobin: 11.9 g/dL — ABNORMAL LOW (ref 13.0–17.0)
Immature Granulocytes: 0 %
Lymphocytes Relative: 15 %
Lymphs Abs: 1.3 K/uL (ref 0.7–4.0)
MCH: 29.8 pg (ref 26.0–34.0)
MCHC: 33.3 g/dL (ref 30.0–36.0)
MCV: 89.3 fL (ref 80.0–100.0)
Monocytes Absolute: 0.9 K/uL (ref 0.1–1.0)
Monocytes Relative: 11 %
Neutro Abs: 5.9 K/uL (ref 1.7–7.7)
Neutrophils Relative %: 72 %
Platelets: 183 K/uL (ref 150–400)
RBC: 4 MIL/uL — ABNORMAL LOW (ref 4.22–5.81)
RDW: 13.9 % (ref 11.5–15.5)
WBC: 8.2 K/uL (ref 4.0–10.5)
nRBC: 0 % (ref 0.0–0.2)

## 2024-02-16 LAB — COMPREHENSIVE METABOLIC PANEL WITH GFR
ALT: 28 U/L (ref 0–44)
AST: 26 U/L (ref 15–41)
Albumin: 4 g/dL (ref 3.5–5.0)
Alkaline Phosphatase: 59 U/L (ref 38–126)
Anion gap: 13 (ref 5–15)
BUN: 19 mg/dL (ref 8–23)
CO2: 24 mmol/L (ref 22–32)
Calcium: 9.7 mg/dL (ref 8.9–10.3)
Chloride: 101 mmol/L (ref 98–111)
Creatinine, Ser: 0.83 mg/dL (ref 0.61–1.24)
GFR, Estimated: >60 mL/min
Glucose, Bld: 134 mg/dL — ABNORMAL HIGH (ref 70–99)
Potassium: 3.8 mmol/L (ref 3.5–5.1)
Sodium: 138 mmol/L (ref 135–145)
Total Bilirubin: 0.5 mg/dL (ref 0.0–1.2)
Total Protein: 6.3 g/dL — ABNORMAL LOW (ref 6.5–8.1)

## 2024-02-16 LAB — GLUCOSE, CAPILLARY: Glucose-Capillary: 229 mg/dL — ABNORMAL HIGH (ref 70–99)

## 2024-02-16 LAB — MAGNESIUM: Magnesium: 1.7 mg/dL (ref 1.7–2.4)

## 2024-02-16 MED ORDER — MAGNESIUM SULFATE IN D5W 1-5 GM/100ML-% IV SOLN
1.0000 g | Freq: Once | INTRAVENOUS | Status: AC
  Administered 2024-02-16: 1 g via INTRAVENOUS
  Filled 2024-02-16: qty 100

## 2024-02-16 MED ORDER — POTASSIUM CHLORIDE CRYS ER 20 MEQ PO TBCR
20.0000 meq | EXTENDED_RELEASE_TABLET | Freq: Once | ORAL | Status: AC
  Administered 2024-02-16: 20 meq via ORAL
  Filled 2024-02-16: qty 1

## 2024-02-16 MED ORDER — LEVETIRACETAM 500 MG PO TABS
500.0000 mg | ORAL_TABLET | Freq: Two times a day (BID) | ORAL | 0 refills | Status: AC
  Filled 2024-02-16: qty 60, 30d supply, fill #0

## 2024-02-16 MED ORDER — THIAMINE HCL 100 MG PO TABS
100.0000 mg | ORAL_TABLET | Freq: Every day | ORAL | 0 refills | Status: AC
  Filled 2024-02-16: qty 30, 30d supply, fill #0

## 2024-02-16 NOTE — TOC Transition Note (Addendum)
 Transition of Care [facility]) - Discharge Note   Patient Details  Name: Scott Price MRN: [id number] Date of Birth: 1945/11/19  Transition [facility] Care [facility]) CM/SW Contact:  [name], RN Phone Number: 02/16/2024, 8:01 AM   Clinical Narrative:     Patient will be discharging home today, with daughter providing transportation.   No needs identified by therapy.   New medications to be picked up at [facility] pharmacy. No further needs identified by CM.   Final next level of care: Home/Self Care Barriers to Discharge: Continued Medical Work up   Patient Goals and CMS Choice            Discharge Placement                       Discharge Plan and Services Additional resources added to the After Visit Summary for                                       Social Drivers of Health (SDOH) Interventions SDOH Screenings   Food Insecurity: No Food Insecurity (02/14/2024)  Housing: Unknown (02/14/2024)  Transportation Needs: No Transportation Needs (02/14/2024)  Utilities: Not At Risk (02/14/2024)  Social Connections: Socially Integrated (02/14/2024)  Tobacco Use: Medium Risk (02/12/2024)     Readmission Risk Interventions     No data to display

## 2024-02-16 NOTE — Plan of Care (Signed)
  Problem: Clinical Measurements: Goal: Ability to maintain clinical measurements within normal limits will improve Outcome: Progressing Goal: Will remain free from infection Outcome: Progressing   Problem: Activity: Goal: Risk for activity intolerance will decrease Outcome: Progressing   Problem: Elimination: Goal: Will not experience complications related to bowel motility Outcome: Progressing   Problem: Safety: Goal: Ability to remain free from injury will improve Outcome: Progressing

## 2024-02-16 NOTE — Progress Notes (Signed)
 Physical Therapy Treatment Patient Details Name: Scott Price MRN: [id number] DOB: 1945/07/21 Today's Date: 02/16/2024   History of Present Illness 79 year old male who was transferred from outside hospital as a code stroke.  Patient was working and drove himself home, feeling nauseous and later family noted him to be confused, and febrile; workup for sepsis with unclear sourse, Infectioust disease following; So far, imaging neg (MRI pending, for when pt able to toelrate), LP done. Per ID, meningitis unlikely, concerns for septic/metabolic encephalopathy. PMH: DM 2, HTN, sick sinus syndrome with CHB s/p PPM, CAD, IDA    PT Comments  Pt received in the recliner and agreeable to session. Pt reports no mobility concerns when returning home. Pt demonstrates some instability without UE support and low foot clearance increasing risk of falls. Pt able to perform balance challenges without LOB and CGA for safety. Discussed use of SPC for improved stability and decreased risk of falls. Education provided on safety and activity progression for improved balance and strength. Anticipate pt and family will be able to manage pt's mobility needs at home once medically ready for discharge.     If plan is discharge home, recommend the following: Assistance with cooking/housework;Supervision due to cognitive status;Help with stairs or ramp for entrance   Can travel by private vehicle        Equipment Recommendations  None recommended by PT    Recommendations for Other Services       Precautions / Restrictions Precautions Precautions: Fall Recall of Precautions/Restrictions: Intact Restrictions Weight Bearing Restrictions Per Provider Order: No     Mobility  Bed Mobility               General bed mobility comments: in recliner    Transfers Overall transfer level: Modified independent Equipment used: None Transfers: Sit to/from Stand Sit to Stand: Modified independent (Device/Increase time)                 Ambulation/Gait Ambulation/Gait assistance: Contact guard assist Gait Distance (Feet): 300 Feet Assistive device: None Gait Pattern/deviations: Step-through pattern, Decreased stride length, Drifts right/left       General Gait Details: some unsteadiness requiring stepping strategy intermittently, but no overt LOB. decreased pace with balance challenges, ie head turns and stepping over an obstacle   Stairs             Wheelchair Mobility     Tilt Bed    Modified Rankin (Stroke Patients Only)       Balance Overall balance assessment: Needs assistance Sitting-balance support: Feet supported, No upper extremity supported Sitting balance-Leahy Scale: Good     Standing balance support: No upper extremity supported, During functional activity Standing balance-Leahy Scale: Fair Standing balance comment: some instability, but no overt LOB               High Level Balance Comments: horizontal and vertical head turns, change in speed, and stepping over obstacle            Communication Communication Communication: No apparent difficulties  Cognition Arousal: Alert Behavior During Therapy: WFL for tasks assessed/performed   PT - Cognitive impairments: No apparent impairments                         Following commands: Intact      Cueing Cueing Techniques: Verbal cues  Exercises Other Exercises Other Exercises: static standing marches without UE support    General Comments  Pertinent Vitals/Pain Pain Assessment Pain Assessment: Faces Faces Pain Scale: No hurt     PT Goals (current goals can now be found in the care plan section) Acute Rehab PT Goals Patient Stated Goal: home PT Goal Formulation: With patient Time For Goal Achievement: 02/28/24 Progress towards PT goals: Progressing toward goals    Frequency    Min 2X/week       AM-PAC PT 6 Clicks Mobility   Outcome Measure  Help needed turning  from your back to your side while in a flat bed without using bedrails?: None Help needed moving from lying on your back to sitting on the side of a flat bed without using bedrails?: None Help needed moving to and from a bed to a chair (including a wheelchair)?: None Help needed standing up from a chair using your arms (e.g., wheelchair or bedside chair)?: None Help needed to walk in hospital room?: A Little Help needed climbing 3-5 steps with a railing? : A Little 6 Click Score: 22    End of Session Equipment Utilized During Treatment: Gait belt Activity Tolerance: Patient tolerated treatment well Patient left: in chair;with call bell/phone within reach;with chair alarm set Nurse Communication: Mobility status PT Visit Diagnosis: Unsteadiness on feet (R26.81)     Time: 9156-9146 PT Time Calculation (min) (ACUTE ONLY): 10 min  Charges:    $Gait Training: 8-22 mins PT General Charges $$ ACUTE PT VISIT: 1 Visit                    Scott Price, PTA Acute Rehabilitation Services Secure Chat Preferred  [phone]    Scott Price 02/16/2024, 9:32 AM

## 2024-02-16 NOTE — Discharge Summary (Signed)
 "                                                                                                                                                                               Discharge summary note.  Scott Price FMW:[id number] DOB: Sep 11, 1945 DOA: 02/12/2024  [facility]: [facility]  Admit date: 02/12/2024  Discharge date: 02/16/2024  Admitted From: Home   Disposition:  Home   Recommendations for Outpatient Follow-up:   Follow up with PCP in 1-2 weeks  PCP Please obtain BMP/CBC, 2 view CXR in 1week,  (see Discharge instructions)   PCP Please follow up on the following pending results:    Home Health: None   Equipment/Devices: None  Consultations: Neuro, ID Discharge Condition: Stable    CODE STATUS: Full    Diet Recommendation: Heart Healthy Low Carb    Chief Complaint  Patient presents with   AMS   Code Stroke     Brief history of present illness from the day of admission and additional interim summary    79 y.o. male with medical history significant of type 2 diabetes, essential hypertension, history of sick sinus syndrome with complete heart block status post pacemaker placement, coronary artery disease, iron deficiency anemia who was transferred from [facility] as a code stroke.  Patient is doing well apparently prior to all this.  He was working and drove himself home.  Patient was started on Ozempic last week.  First dose of this in the second of this week.  He started feeling bad and told his doctors.  He was taken off the Ozempic and told not to use it again.  Patient went back to taking his metformin  yesterday.  Today he came home and family noted he is confused.  His wife was right in front of him but he was calling her out.  Patient also with some fever.  They were worried and took him to the ER.  Patient was having acute metabolic encephalopathy of unknown cause.  Acute stroke suspected and a code stroke initiated.     Patient has been seen by neurology  here.  No obvious neurologic deficit but he is agitated and confused.  He meets sepsis criteria with a temperature of 101.3, white count 14.9.  He is also having significant tachycardia.  No obvious source.  With altered mental status suspicion was for possible meningitis.  Lumbar puncture was performed and patient is being admitted to the hospital for further evaluation and treatment.  Hospital Course   #1 Acute metabolic encephalopathy, possible seizures, due to sepsis of unknown source: Patient had thorough workup for source of sepsis including CT chest abdomen pelvis, LP, no acute findings were identified except he had wildly elevated protein levels in his CSF, he also was positive for HSV 6 PCR in CSF, but this was thought not to be due to an acute infection by ID, he was seen by ID, was taken off of all antibiotics and antiviral medications.  Likely had undetectable viral infection, no source of infection, afebrile, no headache, no photophobia, Supple neck, completely symptom-free today.  Case discussed with both ID and neurology, cleared for discharge.  Will be on Keppra  and thiamine  upon discharge with outpatient follow-up with PCP and neurology.  For now written instructions for seizure precautions provided, should follow-up with neurology in 4 to 6 weeks postdischarge.    #2 Suspected meningitis: See above   #3 Acute metabolic encephalopathy: Patient has no evidence of CVA and had an unremarkable MRI.  Neurology has signed off, case was discussed with neurology x 3, he will be discharged on Keppra  along with thiamine  daily with outpatient neurology follow-up.    #4 Non-insulin -dependent diabetes: Was on sliding scale here commence home regimen upon discharge with PCP to monitor and follow  CBG (last 3)  Recent Labs    02/15/24 1137 02/15/24 1556 02/15/24 2024  GLUCAP 180* 165* 170*   Lab Results  Component Value Date    HGBA1C 8.0 (H) 02/13/2024     #5 essential hypertension: Stable blood pressure now continue home medication   #6 hyperlipidemia: Continue statin.   #7 hypercalcemia: Due to dehydration resolved after IV fluids   #8 AKI: Resolved after IV fluids.   #9 Sick sinus syndrome: Patient has a pacemaker in place.  Continue to monitor   Discharge diagnosis     Principal Problem:   Sepsis (HCC) Active Problems:   Pacemaker   Hypertension   Type II diabetes mellitus (HCC)   Iron deficiency anemia   Hyperlipidemia   Acute metabolic encephalopathy   Acute encephalopathy   Encephalopathy   Encephalitis    Discharge instructions    Discharge Instructions     Discharge instructions   Complete by: As directed    Do not drive, operate heavy machinery, perform activities at heights, swimming or participation in water activities or provide baby sitting services until you have seen by Primary MD or a Neurologist and advised to do so again.  Follow with Primary MD [facility] in 7 days   Get CBC, CMP, Magnesium , 2 view Chest X ray -  checked next visit with your primary MD or   Activity: As tolerated with Full fall precautions use walker/cane & assistance as needed  Disposition Home    Diet: Heart Healthy low carbohydrate diet.  Check CBGs q. ACHS  Special Instructions: If you have smoked or chewed Tobacco  in the last 2 yrs please stop smoking, stop any regular Alcohol  and or any Recreational drug use.  On your next visit with your primary care physician please Get Medicines reviewed and adjusted.  Please request your Prim.MD to go over all Hospital Tests and Procedure/Radiological results at the follow up, please get all Hospital records sent to your Prim MD by signing hospital release before you go home.  If you experience worsening of your admission symptoms, develop shortness of breath, life threatening emergency, suicidal or homicidal thoughts you must seek  medical attention immediately  by calling 911 or calling your MD immediately  if symptoms less severe.  You Must read complete instructions/literature along with all the possible adverse reactions/side effects for all the Medicines you take and that have been prescribed to you. Take any new Medicines after you have completely understood and accpet all the possible adverse reactions/side effects.   Do not drive when taking Pain medications.  Do not take more than prescribed Pain, Sleep and Anxiety Medications  Wear Seat belts while driving.   Increase activity slowly   Complete by: As directed        Discharge Medications   Allergies as of 02/16/2024       Reactions   Doxycycline Rash, Shortness Of Breath, Dermatitis, Nausea And Vomiting   Prednisone Other (See Comments)   High blood sugar   Sulfa Antibiotics Rash, Other (See Comments)   Headache/ Chest tightness/ stiff neck   Advair Hfa [fluticasone -salmeterol] Other (See Comments)   Thrush and sore throat   Pirfenidone  Itching, Rash, Other (See Comments)   Other Reaction(s): Lip swelling, Itching, Erythema, Lip swelling, Itching, Erythema   Amoxicillin Rash   Furosemide  Dermatitis, Rash   Gabapentin Nausea And Vomiting   Lisinopril  Cough   Ofev  [nintedanib] Nausea And Vomiting        Medication List     TAKE these medications    aspirin  EC 81 MG tablet Take 81 mg by mouth daily.   cyanocobalamin  1000 MCG tablet Take 1,000 mcg by mouth daily.   DULoxetine 30 MG capsule Commonly known as: CYMBALTA Take 30 mg by mouth 2 (two) times daily.   empagliflozin 25 MG Tabs tablet Commonly known as: JARDIANCE Take 12.5 mg by mouth 2 (two) times daily.   ferrous sulfate  325 (65 FE) MG tablet Take 325 mg by mouth daily with breakfast.   glipiZIDE  10 MG tablet Commonly known as: GLUCOTROL  Take 5 mg by mouth 2 (two) times daily before a meal.   levETIRAcetam  500 MG tablet Commonly known as: KEPPRA  Take 1 tablet (500 mg  total) by mouth 2 (two) times daily.   losartan  25 MG tablet Commonly known as: COZAAR  TAKE 1 TABLET (25 MG TOTAL) BY MOUTH DAILY.   magnesium  oxide 400 MG tablet Commonly known as: MAG-OX Take 400 mg by mouth every morning.   metFORMIN  1000 MG tablet Commonly known as: GLUCOPHAGE  Take 1 tablet (1,000 mg total) by mouth in the morning and at bedtime.   Multi-Vitamin tablet Take 1 tablet by mouth daily.   omeprazole 20 MG capsule Commonly known as: PRILOSEC Take 20 mg by mouth See admin instructions. Every other night   rosuvastatin  40 MG tablet Commonly known as: CRESTOR  Take 40 mg by mouth at bedtime.   tamsulosin  0.4 MG Caps capsule Commonly known as: FLOMAX  Take 0.8 mg by mouth at bedtime.   thiamine  100 MG tablet Commonly known as: VITAMIN B1 Take 1 tablet (100 mg total) by mouth daily.   traZODone 50 MG tablet Commonly known as: DESYREL Take 50 mg by mouth at bedtime.   Vitamin D3 25 MCG (1000 UT) Caps Take 1,000 Units by mouth daily.         Follow-up Information     [facility] Schedule an appointment as soon as possible for a visit in 1 week(s).   Contact information: [address] [facility] [place] [address] [phone]         [facility]. Schedule an appointment as soon as possible for a visit in  1 week(s).   Contact information: [address]     [address] [place]  [address] [phone]        Fleeta Rothman, Jomarie SAILOR, MD. Schedule an appointment as soon as possible for a visit in 1 week(s).   Specialty: Infectious Diseases Contact information: [address] [place] [address] [phone]                 Major procedures and Radiology Reports - PLEASE review detailed and final reports thoroughly  -      MR BRAIN WO CONTRAST Result Date: 02/15/2024 CLINICAL DATA:  Initial evaluation for new onset seizure. EXAM: MRI HEAD WITHOUT CONTRAST  TECHNIQUE: Multiplanar, multiecho pulse sequences of the brain and surrounding structures were obtained without intravenous contrast. COMPARISON:  CT from 02/12/2024. FINDINGS: Brain: Cerebral volume within normal limits. Patchy T2/FLAIR hyperintensity seen involving the supratentorial cerebral white matter, most characteristic of chronic microvascular ischemic disease, mild for age. No evidence for acute or subacute infarct. No areas of chronic cortical infarction or other insult. No acute or chronic intracranial blood products. No mass lesion, midline shift or mass effect. No hydrocephalus or extra-axial fluid collection. Pituitary gland within normal limits. No intrinsic temporal lobe abnormality. Vascular: Major intracranial vascular flow voids are maintained. Skull and upper cervical spine: Craniocervical junction within normal limits. Bone marrow signal intensity overall within normal limits. No scalp soft tissue abnormality. Sinuses/Orbits: Globes orbital soft tissues within normal limits. Paranasal sinuses are largely clear. Trace bilateral mastoid effusions noted, of doubtful significance. Other: None. IMPRESSION: 1. No acute intracranial abnormality. 2. Mild chronic microvascular ischemic disease for age. Electronically Signed   By: Morene Hoard M.D.   On: 02/15/2024 19:31   Overnight EEG with video Result Date: 02/14/2024 Shelton Arlin KIDD, MD     02/14/2024  4:35 PM Patient Name: Scott Price MRN: [id number] Epilepsy Attending: Arlin KIDD Shelton Referring Physician/Provider: Waddell Karna LABOR, NP Duration: 02/13/2024 1500 to 02/14/2024 1040 Patient history: 78 y.o. male  who presented to [facility] for evaluation of nausea after work and then while speaking to his wife became aphasic. EMS noted twitching of his right arm and foot. EEG to evaluate for seizure. Level of alertness: Awake, asleep AEDs during EEG study: LEV Technical aspects: This EEG study was done with scalp electrodes positioned according to  the 10-20 International system of electrode placement. Electrical activity was reviewed with band pass filter of 1-70Hz , sensitivity of 7 uV/mm, display speed of 58mm/sec with a 60Hz  notched filter applied as appropriate. EEG data were recorded continuously and digitally stored.  Video monitoring was available and reviewed as appropriate. Description: The posterior dominant rhythm consists of 8Hz  activity of moderate voltage (25-35 uV) seen predominantly in posterior head regions, symmetric and reactive to eye opening and eye closing. Sleep was characterized by vertex waves, sleep spindles (12 to 14 Hz), maximal frontocentral region. There is continuous 3 to 6 Hz theta-delta slowing in left hemisphere. Hyperventilation and photic stimulation were not performed.   ABNORMALITY - Continuous slow,  left hemisphere IMPRESSION: This study is suggestive of cortical dysfunction arising from left hemisphere likely secondary to underlying structural abnormality, post ictal state. No seizures or epileptiform discharges were seen throughout the recording. Priyanka KIDD Shelton   CT CHEST ABDOMEN PELVIS WO CONTRAST Result Date: 02/13/2024 EXAM: CT CHEST, ABDOMEN AND PELVIS WITHOUT CONTRAST 02/13/2024 04:39:53 PM TECHNIQUE: CT of the chest, abdomen and pelvis was performed without the administration of intravenous contrast. Multiplanar reformatted images  are provided for review. Automated exposure control, iterative reconstruction, and/or weight based adjustment of the mA/kV was utilized to reduce the radiation dose to as low as reasonably achievable. COMPARISON: None available. CLINICAL HISTORY: Fevers or sepsis of unclear origin. FINDINGS: CHEST: MEDIASTINUM AND LYMPH NODES: Calcification of the aortic valve leaflets. Extensive multivessel coronary artery calcification. A left subclavian dual-lead pacemaker is identified with leads within the right atrium and right ventricle toward the inferior septum. Moderate atherosclerotic  calcification within the thoracic aorta. The central airways are clear. No mediastinal, hilar or axillary lymphadenopathy. LUNGS AND PLEURA: Mild bilateral pulmonary dependent fibrotic change. No focal consolidation or pulmonary edema. No pleural effusion. No pneumothorax. ABDOMEN AND PELVIS: LIVER: Unremarkable. GALLBLADDER AND BILE DUCTS: Cholelithiasis without superimposed pericholecystic inflammatory change. No intra or extrahepatic biliary ductal dilation. SPLEEN: No acute abnormality. PANCREAS: No acute abnormality. ADRENAL GLANDS: No acute abnormality. KIDNEYS, URETERS AND BLADDER: Foley catheter balloon within a decompressed bladder lumen. No stones in the kidneys or ureters. No hydronephrosis. No perinephric or periureteral stranding. GI AND BOWEL: Stomach demonstrates no acute abnormality. Severe distal colonic diverticulosis. Scattered diverticula seen throughout the transverse colon. No superimposed acute inflammatory change. Appendix normal. The small bowel and large bowel are otherwise unremarkable. There is no bowel obstruction. REPRODUCTIVE ORGANS: Mild prostatic hypertrophy. PERITONEUM AND RETROPERITONEUM: No ascites. No free air. VASCULATURE: Aorta is normal in caliber. Moderate atherosclerotic calcification within the thoracic aorta. Extend to the aortoiliac atherosclerotic calcification. Particularly prominent lower extremity arterial inflow and outflow calcifications. All the degree of stenosis is not well assessed on this noncontrast examination, hemodynamically significant stenoses are suspected and correlation for lower extremity arterial insufficiency (rest pain, claudication) is recommended. If present, CT arteriography may be helpful for further evaluation. ABDOMINAL AND PELVIS LYMPH NODES: No lymphadenopathy. BONES AND SOFT TISSUES: Osseous structures are age appropriate. No acute bone abnormality. No lytic or blastic bone lesion. No focal soft tissue abnormality. IMPRESSION: 1. No acute  findings in the chest, abdomen, or pelvis. 2. Cholelithiasis without CT evidence of acute cholecystitis or biliary ductal dilation. 3. Severe distal colonic diverticulosis and scattered transverse colon diverticula without CT evidence of acute diverticulitis. 4. Extensive atherosclerotic calcification involving the lower extremity arterial inflow and outflow, with suspected hemodynamically significant stenoses on this noncontrast exam; if symptoms of lower extremity arterial insufficiency are present, CT arteriography may be helpful for further evaluation. 5. Extensive multivessel coronary artery calcification. 6. Calcification of the aortic valve leaflets. 7. Mild prostatic hypertrophy. Electronically signed by: Dorethia Molt MD MD 02/13/2024 07:38 PM EST RP Workstation: [id number]   DG CHEST PORT 1 VIEW Result Date: 02/12/2024 EXAM: 1 VIEW(S) XRAY OF THE CHEST 02/12/2024 09:01:06 PM COMPARISON: 12/27/2019 CLINICAL HISTORY: AMS (altered mental status) FINDINGS: Patient is rotated. LINES, TUBES AND DEVICES: Left chest wall dual lead pacemaker noted. LUNGS AND PLEURA: No focal pulmonary opacity. No pleural effusion. No pneumothorax. HEART AND MEDIASTINUM: No acute abnormality of the cardiac and mediastinal silhouettes. Left chest wall dual lead pacemaker noted. Atherosclerotic plaque noted. BONES AND SOFT TISSUES: No acute osseous abnormality. Cervical spine surgical hardware noted. IMPRESSION: 1. No acute cardiopulmonary findings. Electronically signed by: Morgane Naveau MD MD 02/12/2024 09:18 PM EST RP Workstation: [id number]   CT HEAD CODE STROKE WO CONTRAST (LKW 0-4.5h, LVO 0-24h) Result Date: 02/12/2024 EXAM: CT HEAD WITHOUT CONTRAST 02/12/2024 03:33:18 PM TECHNIQUE: CT of the head was performed without the administration of intravenous contrast. Automated exposure control, iterative reconstruction, and/or weight based adjustment of the mA/kV was utilized to reduce  the radiation dose to as low as reasonably  achievable. COMPARISON: None available. CLINICAL HISTORY: Neuro deficit, acute, stroke suspected. FINDINGS: BRAIN AND VENTRICLES: No acute hemorrhage. No evidence of acute infarct. No hydrocephalus. No extra-axial collection. No mass effect or midline shift. Atherosclerotic calcifications within cavernous and supraclinoid internal carotid arteries. [place] Stroke Program Early CT (ASPECT) score: Ganglionic (caudate, internal capsule, lentiform nucleus, insula, M1-M3): 7 Supraganglionic (M4-M6): 3 Total: 10 ORBITS: No acute abnormality. SINUSES: No acute abnormality. SOFT TISSUES AND SKULL: No acute soft tissue abnormality. No skull fracture. IMPRESSION: 1. No acute intracranial abnormality. 2. ASPECTS 10. 3. Findings messaged to Dr. Lindzen at 3:42 PM on 02/12/24. Electronically signed by: Donnice Mania MD MD 02/12/2024 03:42 PM EST RP Workstation: [id number]   CUP PACEART REMOTE DEVICE CHECK Result Date: 02/01/2024 PPM Scheduled remote reviewed. Normal device function.  Presenting rhythm: AS-VP.  1 AHR lasting 1 second.  2 device classified NSVT, longest 16 beats. Next remote transmission per protocol. - CS, CVRS   Micro Results    Recent Results (from the past 240 hours)  Blood culture (routine x 2)     Status: None (Preliminary result)   Collection Time: 02/12/24  6:15 PM   Specimen: BLOOD RIGHT ARM  Result Value Ref Range Status   Specimen Description BLOOD RIGHT ARM  Final   Special Requests   Final    BOTTLES DRAWN AEROBIC AND ANAEROBIC Blood Culture results may not be optimal due to an inadequate volume of blood received in culture bottles   Culture   Final    NO GROWTH 3 DAYS Performed at [facility], [address], [place], [place] [address]    Report Status PENDING  Incomplete  CSF culture     Status: None   Collection Time: 02/12/24  6:55 PM   Specimen: CSF; Cerebrospinal Fluid  Result Value Ref Range Status   Specimen Description CSF  Final   Special Requests Normal   Final   Gram Stain   Final    WBC PRESENT, PREDOMINANTLY MONONUCLEAR NO ORGANISMS SEEN CYTOSPIN SMEAR    Culture   Final    NO GROWTH 3 DAYS Performed at [facility], [address], [place], [place] [address]    Report Status 02/15/2024 FINAL  Final  Resp panel by RT-PCR (RSV, Flu A&B, Covid) Anterior Nasal Swab     Status: None   Collection Time: 02/13/24  3:02 PM   Specimen: Anterior Nasal Swab  Result Value Ref Range Status   SARS Coronavirus 2 by RT PCR NEGATIVE NEGATIVE Final   Influenza A by PCR NEGATIVE NEGATIVE Final   Influenza B by PCR NEGATIVE NEGATIVE Final    Comment: (NOTE) The Xpert Xpress SARS-CoV-2/FLU/RSV plus assay is intended as an aid in the diagnosis of influenza from Nasopharyngeal swab specimens and should not be used as a sole basis for treatment. Nasal washings and aspirates are unacceptable for Xpert Xpress SARS-CoV-2/FLU/RSV testing.  Fact Sheet for Patients: bloggercourse.com  Fact Sheet for Healthcare Providers: seriousbroker.it  This test is not yet approved or cleared by the [place]  FDA and has been authorized for detection and/or diagnosis of SARS-CoV-2 by FDA under an Emergency Use Authorization (EUA). This EUA will remain in effect (meaning this test can be used) for the duration of the COVID-19 declaration under Section 564(b)(1) of the Act, 21 U.S.C. section 360bbb-3(b)(1), unless the authorization is terminated or revoked.     Resp Syncytial Virus by PCR NEGATIVE NEGATIVE Final  Comment: (NOTE) Fact Sheet for Patients: bloggercourse.com  Fact Sheet for Healthcare Providers: seriousbroker.it  This test is not yet approved or cleared by the [place]  FDA and has been authorized for detection and/or diagnosis of SARS-CoV-2 by FDA under an Emergency Use Authorization (EUA). This EUA will remain in effect (meaning  this test can be used) for the duration of the COVID-19 declaration under Section 564(b)(1) of the Act, 21 U.S.C. section 360bbb-3(b)(1), unless the authorization is terminated or revoked.  Performed at [facility], [address], [place], [place] [address]   Respiratory (~20 pathogens) panel by PCR     Status: None   Collection Time: 02/13/24  3:02 PM   Specimen: Nasopharyngeal Swab; Respiratory  Result Value Ref Range Status   Adenovirus NOT DETECTED NOT DETECTED Final   Coronavirus 229E NOT DETECTED NOT DETECTED Final    Comment: (NOTE) The Coronavirus on the Respiratory Panel, DOES NOT test for the novel  Coronavirus (2019 nCoV)    Coronavirus HKU1 NOT DETECTED NOT DETECTED Final   Coronavirus NL63 NOT DETECTED NOT DETECTED Final   Coronavirus OC43 NOT DETECTED NOT DETECTED Final   Metapneumovirus NOT DETECTED NOT DETECTED Final   Rhinovirus / Enterovirus NOT DETECTED NOT DETECTED Final   Influenza A NOT DETECTED NOT DETECTED Final   Influenza B NOT DETECTED NOT DETECTED Final   Parainfluenza Virus 1 NOT DETECTED NOT DETECTED Final   Parainfluenza Virus 2 NOT DETECTED NOT DETECTED Final   Parainfluenza Virus 3 NOT DETECTED NOT DETECTED Final   Parainfluenza Virus 4 NOT DETECTED NOT DETECTED Final   Respiratory Syncytial Virus NOT DETECTED NOT DETECTED Final   Bordetella pertussis NOT DETECTED NOT DETECTED Final   Bordetella Parapertussis NOT DETECTED NOT DETECTED Final   Chlamydophila pneumoniae NOT DETECTED NOT DETECTED Final   Mycoplasma pneumoniae NOT DETECTED NOT DETECTED Final    Comment: Performed at [facility], [address], [place], [place] [address]  Culture, blood (Routine X 2) w Reflex to ID Panel     Status: None (Preliminary result)   Collection Time: 02/13/24  3:22 PM   Specimen: BLOOD RIGHT HAND  Result Value Ref Range Status   Specimen Description BLOOD RIGHT HAND  Final   Special Requests   Final    BOTTLES DRAWN AEROBIC AND ANAEROBIC  Blood Culture results may not be optimal due to an inadequate volume of blood received in culture bottles   Culture   Final    NO GROWTH 2 DAYS Performed at [facility], [address] [place], [place] [address]    Report Status PENDING  Incomplete  Culture, blood (Routine X 2) w Reflex to ID Panel     Status: None (Preliminary result)   Collection Time: 02/13/24  3:22 PM   Specimen: BLOOD LEFT HAND  Result Value Ref Range Status   Specimen Description BLOOD LEFT HAND  Final   Special Requests   Final    BOTTLES DRAWN AEROBIC AND ANAEROBIC Blood Culture results may not be optimal due to an inadequate volume of blood received in culture bottles   Culture   Final    NO GROWTH 2 DAYS Performed at [facility], [address] [place], [place] [address]    Report Status PENDING  Incomplete  MRSA Next Gen by PCR, Nasal     Status: Abnormal   Collection Time: 02/14/24  5:20 AM   Specimen: Nasal Mucosa; Nasal Swab  Result Value Ref Range  Status   MRSA by PCR Next Gen DETECTED (A) NOT DETECTED Final    Comment: (NOTE) The GeneXpert MRSA Assay (FDA approved for NASAL specimens only), is one component of a comprehensive MRSA colonization surveillance program. It is not intended to diagnose MRSA infection nor to guide or monitor treatment for MRSA infections. Test performance is not FDA approved in patients less than 65 years old. Performed at [facility], [address] [place], [place] [address]     Today   Subjective    Scott Price today has no headache,no chest abdominal pain,no new weakness tingling or numbness, feels much better wants to go home today.    Objective   Blood pressure 126/61, pulse 71, temperature 98 F (36.7 C), temperature source Oral, resp. rate (!) 21, height 5' 9 (1.753 m), weight 78.7 kg, SpO2 98%.   Intake/Output Summary (Last 24 hours) at 02/16/2024 0803 Last data filed at 02/15/2024 2039 Gross per 24 hour  Intake --  Output 790  ml  Net -790 ml    Exam  Awake Alert, No new F.N deficits,    [place].AT,PERRAL Supple Neck,   Symmetrical Chest wall movement, Good air movement bilaterally, CTAB RRR,No Gallops,   +ve B.Sounds, Abd Soft, Non tender,  No Cyanosis, Clubbing or edema    Data Review   Recent Labs  Lab 02/12/24 1544 02/12/24 1922 02/13/24 1136 02/13/24 2335 02/14/24 0826 02/15/24 0429 02/16/24 0216  WBC 14.9*  --  13.7* 14.8* 12.1* 9.5 8.2  HGB 14.0   < > 13.2 12.4* 11.9* 12.3* 11.9*  HCT 42.9   < > 39.8 37.0* 36.7* 36.9* 35.7*  PLT 207  --  176 165 161 168 183  MCV 92.9  --  91.7 92.7 93.6 90.9 89.3  MCH 30.3  --  30.4 31.1 30.4 30.3 29.8  MCHC 32.6  --  33.2 33.5 32.4 33.3 33.3  RDW 14.0  --  14.6 14.6 14.6 14.2 13.9  LYMPHSABS 1.1  --   --   --  1.1 1.1 1.3  MONOABS 1.1*  --   --   --  1.0 0.9 0.9  EOSABS 0.1  --   --   --  0.1 0.2 0.2  BASOSABS 0.0  --   --   --  0.1 0.0 0.0   < > = values in this interval not displayed.    Recent Labs  Lab 02/12/24 1544 02/12/24 1922 02/13/24 1136 02/13/24 1522 02/13/24 2335 02/14/24 0826 02/15/24 0429 02/15/24 1028 02/16/24 0216  NA 140 139  --   --  138 137 136  --  138  K 4.1 4.5  --   --  3.8 4.2 4.1  --  3.8  CL 96* 104  --   --  103 102 100  --  101  CO2 12*  --   --   --  19* 14* 20*  --  24  ANIONGAP 32*  --   --   --  17* 22* 16*  --  13  GLUCOSE 262* 201*  --   --  91 88 137*  --  134*  BUN 21 25*  --   --  19 18 20   --  19  CREATININE 1.49* 1.50* 1.32*  --  1.02 0.95 0.92  --  0.83  AST 33  --   --   --  36 38 32  --  26  ALT 39  --   --   --  32 29 31  --  28  ALKPHOS 70  --   --   --  60 62 65  --  59  BILITOT 1.0  --   --   --  0.6 0.5 0.6  --  0.5  ALBUMIN 4.8  --   --   --  4.2 3.9 4.2  --  4.0  CRP  --   --   --   --   --  0.8  --   --   --   PROCALCITON  --   --   --   --   --  0.12  --   --   --   LATICACIDVEN  --   --  1.2 1.1  --   --   --   --   --   INR 1.0  --   --   --  1.1  --   --   --   --   TSH  --   --    --   --   --   --   --  1.070  --   HGBA1C  --   --  8.0*  --   --   --   --   --   --   AMMONIA  --   --   --   --   --   --   --  15  --   MG  --   --   --   --   --  1.7 1.9  --  1.7  PHOS  --   --   --   --   --  2.9  --   --   --   CALCIUM  10.7*  --   --   --  9.5 9.4 9.9  --  9.7    Total Time in preparing paper work, data evaluation and todays exam - 35 minutes  Signature  -    Lavada Stank M.D on 02/16/2024 at 8:03 AM   -  To page go to [email or web address]      "

## 2024-02-16 NOTE — Plan of Care (Signed)
" °  Problem: Education: Goal: Knowledge of General Education information will improve Description: Including pain rating scale, medication(s)/side effects and non-pharmacologic comfort measures Outcome: Completed/Met   Problem: Health Behavior/Discharge Planning: Goal: Ability to manage health-related needs will improve Outcome: Completed/Met   Problem: Clinical Measurements: Goal: Ability to maintain clinical measurements within normal limits will improve Outcome: Completed/Met Goal: Will remain free from infection Outcome: Completed/Met Goal: Diagnostic test results will improve Outcome: Completed/Met Goal: Respiratory complications will improve Outcome: Completed/Met Goal: Cardiovascular complication will be avoided Outcome: Completed/Met   Problem: Activity: Goal: Risk for activity intolerance will decrease Outcome: Completed/Met   Problem: Nutrition: Goal: Adequate nutrition will be maintained Outcome: Completed/Met   Problem: Coping: Goal: Level of anxiety will decrease Outcome: Completed/Met   Problem: Elimination: Goal: Will not experience complications related to bowel motility Outcome: Completed/Met Goal: Will not experience complications related to urinary retention Outcome: Completed/Met   Problem: Pain Managment: Goal: General experience of comfort will improve and/or be controlled Outcome: Completed/Met   Problem: Safety: Goal: Ability to remain free from injury will improve Outcome: Completed/Met   Problem: Skin Integrity: Goal: Risk for impaired skin integrity will decrease Outcome: Completed/Met   Problem: Education: Goal: Ability to describe self-care measures that may prevent or decrease complications (Diabetes Survival Skills Education) will improve Outcome: Completed/Met Goal: Individualized Educational Video(s) Outcome: Completed/Met   Problem: Coping: Goal: Ability to adjust to condition or change in health will improve Outcome:  Completed/Met   Problem: Fluid Volume: Goal: Ability to maintain a balanced intake and output will improve Outcome: Completed/Met   Problem: Health Behavior/Discharge Planning: Goal: Ability to identify and utilize available resources and services will improve Outcome: Completed/Met Goal: Ability to manage health-related needs will improve Outcome: Completed/Met   Problem: Metabolic: Goal: Ability to maintain appropriate glucose levels will improve Outcome: Completed/Met   Problem: Nutritional: Goal: Maintenance of adequate nutrition will improve Outcome: Completed/Met Goal: Progress toward achieving an optimal weight will improve Outcome: Completed/Met   Problem: Skin Integrity: Goal: Risk for impaired skin integrity will decrease Outcome: Completed/Met   Problem: Tissue Perfusion: Goal: Adequacy of tissue perfusion will improve Outcome: Completed/Met   Problem: Fluid Volume: Goal: Hemodynamic stability will improve Outcome: Completed/Met   Problem: Clinical Measurements: Goal: Diagnostic test results will improve Outcome: Completed/Met Goal: Signs and symptoms of infection will decrease Outcome: Completed/Met   Problem: Respiratory: Goal: Ability to maintain adequate ventilation will improve Outcome: Completed/Met   "

## 2024-02-17 LAB — VITAMIN B1: Vitamin B1 (Thiamine): 129 nmol/L (ref 66.5–200.0)

## 2024-02-17 LAB — CULTURE, BLOOD (ROUTINE X 2): Culture: NO GROWTH

## 2024-02-18 LAB — CULTURE, BLOOD (ROUTINE X 2)
Culture: NO GROWTH
Culture: NO GROWTH

## 2024-02-19 LAB — ARBOVIRUS IGG, CSF
California Enceph IgG: <1:1 {titer}
Eastern eq encephalitis, IgG: <1:1 {titer}
St Louis encephalitis, IgG: <1:1 {titer}
West Nile IgG CSF: 0.1 IV
Western eq encephalitis, IgG: <1:1 {titer}

## 2024-02-23 ENCOUNTER — Ambulatory Visit (HOSPITAL_COMMUNITY)
Admission: RE | Admit: 2024-02-23 | Discharge: 2024-02-23 | Disposition: A | Discharge status: Home / Self Care | Source: Ambulatory Visit | Attending: Cardiology | Admitting: Cardiology

## 2024-02-23 DIAGNOSIS — R630 Anorexia: Secondary | ICD-10-CM | POA: Diagnosis not present

## 2024-02-23 DIAGNOSIS — I350 Nonrheumatic aortic (valve) stenosis: Secondary | ICD-10-CM | POA: Insufficient documentation

## 2024-02-23 DIAGNOSIS — R634 Abnormal weight loss: Secondary | ICD-10-CM | POA: Diagnosis not present

## 2024-02-23 DIAGNOSIS — R0609 Other forms of dyspnea: Secondary | ICD-10-CM | POA: Insufficient documentation

## 2024-02-23 DIAGNOSIS — I340 Nonrheumatic mitral (valve) insufficiency: Secondary | ICD-10-CM | POA: Insufficient documentation

## 2024-02-23 DIAGNOSIS — E119 Type 2 diabetes mellitus without complications: Secondary | ICD-10-CM | POA: Insufficient documentation

## 2024-02-23 DIAGNOSIS — Z950 Presence of cardiac pacemaker: Secondary | ICD-10-CM | POA: Insufficient documentation

## 2024-02-23 DIAGNOSIS — J84112 Idiopathic pulmonary fibrosis: Secondary | ICD-10-CM | POA: Diagnosis not present

## 2024-02-23 DIAGNOSIS — I119 Hypertensive heart disease without heart failure: Secondary | ICD-10-CM | POA: Insufficient documentation

## 2024-02-23 DIAGNOSIS — E785 Hyperlipidemia, unspecified: Secondary | ICD-10-CM | POA: Insufficient documentation

## 2024-02-23 LAB — ECHOCARDIOGRAM COMPLETE
AR max vel: 2.73 cm2
AV Area VTI: 2.84 cm2
AV Area mean vel: 2.84 cm2
AV Mean grad: 11 mmHg
AV Peak grad: 19.2 mmHg
Ao pk vel: 2.19 m/s
Area-P 1/2: 2.11 cm2
MV VTI: 2.98 cm2
S' Lateral: 2.6 cm

## 2024-02-23 NOTE — Progress Notes (Signed)
 Very mild abnormalities. Wil discuss at followup

## 2024-03-02 ENCOUNTER — Inpatient Hospital Stay: Admitting: Internal Medicine

## 2024-03-04 ENCOUNTER — Ambulatory Visit: Admitting: Internal Medicine

## 2024-03-04 ENCOUNTER — Encounter

## 2024-04-14 ENCOUNTER — Ambulatory Visit: Admitting: Neurology

## 2024-04-18 ENCOUNTER — Ambulatory Visit: Payer: Self-pay | Admitting: Primary Care
